# Patient Record
Sex: Male | Born: 1955
Health system: Southern US, Community
[De-identification: ages and names within clinical notes are randomized; demographics above are authoritative.]

## PROBLEM LIST (undated history)

## (undated) DIAGNOSIS — I773 Arterial fibromuscular dysplasia: Secondary | ICD-10-CM

## (undated) DIAGNOSIS — Z8679 Personal history of other diseases of the circulatory system: Secondary | ICD-10-CM

## (undated) DIAGNOSIS — I713 Abdominal aortic aneurysm, ruptured, unspecified: Secondary | ICD-10-CM

## (undated) DIAGNOSIS — Z9889 Other specified postprocedural states: Secondary | ICD-10-CM

## (undated) DIAGNOSIS — I739 Peripheral vascular disease, unspecified: Secondary | ICD-10-CM

## (undated) DIAGNOSIS — I10 Essential (primary) hypertension: Secondary | ICD-10-CM

## (undated) DIAGNOSIS — C801 Malignant (primary) neoplasm, unspecified: Secondary | ICD-10-CM

## (undated) DIAGNOSIS — E785 Hyperlipidemia, unspecified: Secondary | ICD-10-CM

## (undated) HISTORY — PX: HERNIA REPAIR: SHX51

## (undated) HISTORY — DX: Hyperlipidemia, unspecified: E78.5

## (undated) HISTORY — DX: Essential (primary) hypertension: I10

## (undated) HISTORY — PX: HIP ARTHROPLASTY: SHX981

## (undated) HISTORY — PX: KNEE SURGERY: SHX244

---

## 1999-07-18 ENCOUNTER — Other Ambulatory Visit: Admission: RE | Admit: 1999-07-18 | Discharge: 1999-07-18 | Payer: Self-pay | Admitting: Urology

## 2001-11-29 ENCOUNTER — Ambulatory Visit: Admission: RE | Admit: 2001-11-29 | Discharge: 2002-02-27 | Payer: Self-pay | Admitting: Radiation Oncology

## 2004-07-22 ENCOUNTER — Ambulatory Visit: Payer: Self-pay | Admitting: Endocrinology

## 2004-08-12 ENCOUNTER — Ambulatory Visit: Payer: Self-pay | Admitting: *Deleted

## 2004-08-21 ENCOUNTER — Ambulatory Visit: Payer: Self-pay

## 2004-09-18 ENCOUNTER — Ambulatory Visit: Payer: Self-pay | Admitting: *Deleted

## 2004-09-21 HISTORY — PX: CARDIAC SURGERY: SHX584

## 2004-09-23 ENCOUNTER — Inpatient Hospital Stay (HOSPITAL_BASED_OUTPATIENT_CLINIC_OR_DEPARTMENT_OTHER): Admission: RE | Admit: 2004-09-23 | Discharge: 2004-09-23 | Payer: Self-pay | Admitting: *Deleted

## 2004-09-23 ENCOUNTER — Ambulatory Visit: Payer: Self-pay | Admitting: *Deleted

## 2004-09-29 ENCOUNTER — Ambulatory Visit: Payer: Self-pay | Admitting: Cardiology

## 2004-10-03 ENCOUNTER — Ambulatory Visit (HOSPITAL_COMMUNITY): Admission: RE | Admit: 2004-10-03 | Discharge: 2004-10-04 | Payer: Self-pay | Admitting: Cardiology

## 2004-10-13 ENCOUNTER — Ambulatory Visit: Payer: Self-pay | Admitting: Internal Medicine

## 2004-10-31 ENCOUNTER — Ambulatory Visit: Payer: Self-pay | Admitting: Physician Assistant

## 2004-12-02 ENCOUNTER — Ambulatory Visit: Payer: Self-pay | Admitting: Cardiology

## 2005-10-28 ENCOUNTER — Ambulatory Visit: Payer: Self-pay | Admitting: Cardiology

## 2006-01-04 ENCOUNTER — Ambulatory Visit: Payer: Self-pay | Admitting: Cardiology

## 2008-03-26 ENCOUNTER — Ambulatory Visit (HOSPITAL_COMMUNITY): Admission: AD | Admit: 2008-03-26 | Discharge: 2008-03-27 | Payer: Self-pay | Admitting: Orthopedic Surgery

## 2008-09-21 HISTORY — PX: KNEE SURGERY: SHX244

## 2011-02-03 NOTE — Op Note (Signed)
Robert [name], Robert [name]          ACCOUNT NO.:  [id number]   MEDICAL RECORD NO.:  [id number]          PATIENT TYPE:  INP   LOCATION:  0002                         FACILITY:  [facility]   PHYSICIAN:  Madlyn Frankel. Charlann Boxer, M.D.  DATE OF BIRTH:  31-Jan-1956   DATE OF PROCEDURE:  03/26/2008  DATE OF DISCHARGE:                               OPERATIVE REPORT   PREOPERATIVE DIAGNOSIS:  Right knee medial compartment degenerative  changes.   POSTOPERATIVE DIAGNOSIS:  Right knee medial compartment degenerative  changes.   PROCEDURE:  Right knee partial knee replacement utilizing the Biomet  Oxford Knee System, size large femoral component,  a D tibial component  for medial right knee and a size 5 large insert to match the femoral  component.   SURGEON:  Madlyn Frankel. Charlann Boxer, M.D.   ASSISTANT:  Yetta Glassman. Mann, PA.   ANESTHESIA:  A Duramorph spinal block plus LMA.   DRAINS:  One.   TOURNIQUET TIME:  75 minutes at 250 mmHg.   COMPLICATIONS:  None.   INDICATIONS FOR PROCEDURE:  Robert [name] is a 55 year old gentleman who  was referred for evaluation of bilateral knee osteoarthritis,  predominantly worse in the medial compartment with primary complaints  medially.  Radiographically, he did have some evidence of some  patellofemoral change but based on his predominant complaints, we  reviewed his surgical options.  A 55 year old gentleman who is a large  man at 6 feet 4 inches, 290 pounds.  Given the fact he failed all  conservative measures and injections, previous surgeries, he wished to  proceed with an arthroplasty.  I discussed with him the options and  based on his primary complaints, felt that he was a candidate for  partial knee replacement.  We reviewed the pros and cons and risks and  benefits of both of these procedures comparatively, primarily focusing  on the risks of the need to revise him to a total knee replacement at  some point versus having to revised knee at some point with a  total knee  replacement.  After reviewing all these risks and benefits, he wished to  proceed with a primary partial knee replacement.  I discussed with him  the [employer], risks and benefits, and consent was obtained.   DESCRIPTION OF PROCEDURE:  Patient was brought to the operating theater.  Once adequate anesthesia, preoperative antibiotics, 2 grams of Ancef,  administered, patient was positioned supine with a thigh tourniquet  placed and positioning the Oxford leg holder on the side of the bed to  allow for 110-120 degrees of flexion to perform the procedure.  The  right leg was prescrubbed, prepped and draped in a sterile fashion.  Leg  was exsanguinated and tourniquet elevated.  I made a para-midline  incision from the proximal aspect of the patella to the tibial tubercle  region.  Sharp dissection was carried down into this finding the soft  tissues borders and free up soft tissue scaring.  A median arthrotomy  was then carried out modifying the lateral patella subluxation.  Following initial exposure, removing synovial fat pad anteriorly, I  removed the anterior portion of  the medial meniscus and placed  retractors.  I then used a 3/5 osteotome to debride osteophytes on the  medial femoral condyle from anterior to distal.   I then removed a small amount of osteophyte into the lateral notch area  near the ACL. The ACL was intact.  Patient was noted to have some grade  II-III change in the patellofemoral trochlear area.  I did debride some  osteophytes of the medial border of the patella and anterior aspect.  Following this exposure, I attended to the tibial component first, using  extramedullary guide positioned parallel to the tibial spines.  It was  pinned into position.  I used the reciprocating saw into the notch  heading toward the femoral head and then used an oscillating saw to  remove that bone fragment.  I checked the size of the bone cut and felt  that a size D tray  fit best.  No further osteophyte debridement was  necessary on the medial aspect of the tibia.  With this in place, the  size 4 feeler gauge went in easily with a 5 going in with good tension.   At this point, I went ahead and opened up the femoral canal using the  drill anterior to the medial border of the notch.  I then passed the  intramedullary rod into the canal.   At this point, with the D tray in place,, the 4 feeler gauge in place,  the guide for the posterior cutting block was placed.  It was in correct  orientation parallel to the guidewire, it was drilled into position.  Posterior cutting block was then placed and the posterior cut made with  an oscillating saw without difficulty.  At this point, I removed the  remaining meniscus.   A 0 spigot was then used and the distal femur milled.  I did a trial  reduction and felt again that a size 5 felt good in flexion but in  extension initially, I felt there had been one feeler gauge felt to be  better.  At this point, I used a 4 spigot and milled the distal femur,  removing bone fragments with the osteotome and rongeur.  I retrialed.  I  felt that in extension that the 5 feeler gauge was too tight and thus I  went to the 5 spigot and remilled the femur removing bone fragments.  At  this point, I retrialed and felt happy with the tension and the  balancing from extension to flexion.   At this point, we pinned the tibial tray in position.  I used the hook  to ensure that the tibial component did not slide too far posteriorly  and then used the reciprocating saw to drill out the trough area.  I  then used the keel device to remove to remove the bone fragments.  Trial  reduction was now carried out with a keeled tibial component, size D and  the large femur and the trial 5.  At 20 degrees of flexion there was a  millimeter or 2 of play.  Given this, I felt that the ligaments were  well balanced.  All trial components removed, I used  the drill to drill  into the sclerotic bone to allow for cement interdigitation.  We  irrigated the knee copiously, removing any bony fragments as necessary.  Cement was mixed.  I mixed two batches of cement for single cement  technique.   Once the knee had been irrigated and  dried, tibial component was  cemented in first.  Excessive cement was removed and I put the trial  femoral in place and for 2 minutes, held the knee in 45 degree flexion.  Following this, I removed the femoral component and removed excessive  cement in the posterior aspect of the knee and then cemented in the  femoral component.  Then we placed the 5 feeler gauge in place and again  held the knee in 45 degrees of flexion until the cement cured.  Once the  cement cured, I was able to remove the remaining cement.  As much as I  could possibly visualize on the medial and posterior aspect of the knee  using reflective surface of the tibial tray, I did not see any remaining  bone cement that needed to be removed.  I reirrigated out the knee and  based on the trial reduction at this point, felt that a size 5 was best.  The size 5 component was then placed into the knee with a good snap.   We reirrigated the knee at this point and placed the medium Hemovac  drain into the suprapatellar pouch.  I reapproximated the extensor  mechanism with a #1 Vicryl.  The remainder of the wound was closed with  2-0 Vicryl and running 4-0 Monocryl.  The knee was cleaned, dried and  dressed sterilely with dry Steri-Strips and a bulky sterile dressing.  He was brought to the recovery room in stable condition, tolerating the  procedure well.      Madlyn Frankel Charlann Boxer, M.D.  Electronically Signed     MDO/MEDQ  D:  03/26/2008  T:  03/26/2008  Job:  [id number]

## 2011-02-03 NOTE — H&P (Signed)
Robert [name], Robert [name]          ACCOUNT NO.:  [id number]   MEDICAL RECORD NO.:  [id number]          PATIENT TYPE:  INP   LOCATION:  NA                           FACILITY:  [facility]   PHYSICIAN:  Madlyn Frankel. Charlann Boxer, M.D.  DATE OF BIRTH:  May 18, 1956   DATE OF ADMISSION:  03/26/2008  DATE OF DISCHARGE:                              HISTORY & PHYSICAL   PROCEDURE:  A right partial knee replacement.   CHIEF COMPLAINT:  Right knee pain.   HISTORY OF PRESENT ILLNESS:  A 55 year old male with a history of right  knee pain secondary to medial compartment osteoarthritis.  It has been  refractory to all conservative treatment including oral anti-  inflammatories and cortisone injections.  He has diminished quality of  life and has been presurgically cleared for a right partial knee  replacement.   PAST MEDICAL HISTORY:  1. Osteoarthritis.  2. Hypertension.  3. Hyperlipidemia.   PAST SURGICAL HISTORY:  None.   FAMILY HISTORY:  Cancer, hypertension, diabetes, coronary artery  disease, kidney disease.   SOCIAL HISTORY:  The patient is married.  Primary caregiver will be his  wife after surgery.   DRUG ALLERGIES:  NO KNOWN DRUG ALLERGIES.   MEDICATIONS:  1. Aspirin 325 mg p.o. daily.  2. Adalat 90 mg tablet sustained release p.o. daily.  3. Crestor 20 mg one p.o. daily.  4. Benazepril/HCTZ 20/12.5 mg two p.o. daily.   REVIEW OF SYSTEMS:  See HPI.   PHYSICAL EXAMINATION:  VITAL SIGNS:  Pulse 96, respirations 18, blood  pressure 134/88.  GENERAL:  Awake, alert and oriented, well-developed, well-nourished.  NECK:  Supple.  No carotid bruits.  CHEST:  Lungs clear to auscultation bilaterally.  BREASTS:  Deferred.  HEART:  Regular rate and rhythm.  S1-S2 distinct.  ABDOMEN:  Soft, nontender, bowel sounds present.  GENITOURINARY:  Deferred.  EXTREMITIES:  Right lower extremity has medial sided tenderness of his  knee.  SKIN:  No cellulitis.  NEUROLOGIC:  Intact distal sensibilities.   LABORATORIES/EKG/CHEST X-RAY:  All pending presurgical testing.   IMPRESSION:  Right medial compartment knee osteoarthritis.   PLAN OF ACTION:  A right partial knee replacement by surgeon Dr. Lajoyce Corners on March 26, 2008 at [facility].  Risks and complications  were discussed.   POSTOPERATIVE MEDICATIONS:  The postoperative medications provided at  the time of history and physical include:  1. Lovenox.  2. Robaxin.  3. Iron.  4. Aspirin.  5. Colace.  6. MiraLax.  (Pain medicines will be prescribed at time of surgery.)     ______________________________  Yetta Glassman. [address], [place]      Madlyn Frankel. Charlann Boxer, M.D.  Electronically Signed    BLM/MEDQ  D:  03/21/2008  T:  03/21/2008  Job:  [id number]

## 2011-02-06 NOTE — H&P (Signed)
NAMEADALID, BECKMANN          ACCOUNT NO.:  [id number]   MEDICAL RECORD NO.:  [id number]          PATIENT TYPE:  OIB   LOCATION:  2854                         FACILITY:  [facility]   PHYSICIAN:  Salvadore Farber, M.D. LHCDATE OF BIRTH:  Dec 16, 1955   DATE OF ADMISSION:  10/03/2004  DATE OF DISCHARGE:                                HISTORY & PHYSICAL   CHIEF COMPLAINT:  Difficult to control hypertension, probable fibromuscular  dysplasia of the left renal artery.   HISTORY OF PRESENT ILLNESS:  Mr. Turano is a 55 year old gentleman with  left-sided hypertension as well as dyslipidemia, obesity and a family  history of premature atherosclerotic disease.  He recently underwent cardiac  catheterization by Dr. Gerri Spore for an abnormal adenosine Cardiolite.  This  ended up demonstrating normal coronary arteries.  Abdominal aortography was  performed due to his difficult to control blood pressure.  Given the  patient's size, the images were not optimal.  However, they did appear to  demonstrate fibromuscular dysplasia of the single left renal artery.  The  patient returns for digital subtraction angiography, and selective bilateral  renal angiography with an eye to percutaneous revascularization on the left.   PAST MEDICAL HISTORY:  Hypertension, dyslipidemia.   CURRENT MEDICATIONS:  1.  Aspirin 81 mg per day.  2.  Hyzar 100/25 one per day.  3.  Labetalol 400 mg twice per day.  4.  Norvasc 10 mg per day.  5.  Demadex 40 mg twice per day.  6.  K-Dur 40 mEq per day.   ALLERGIES:  No known drug allergies.   SOCIAL HISTORY:  The patient is not currently working.  He is married with  two children aged 30 and 71.  He does not smoke and drinks alcohol rarely.   FAMILY HISTORY:  Brother had myocardial infarction at age 17.  Father had  CABG in his 25s.  Mother had Alzheimer's.   REVIEW OF SYSTEMS:  Negative in detail.   PHYSICAL EXAMINATION:  GENERAL:  This is an obese, otherwise  well-appearing  man in no distress with a heart rate 83, blood pressure 126/90, oxygen  saturation of 97% on room air.  VITAL SIGNS:  Temperature 98.8.  NECK:  No jugular venous distention or thyromegaly.  LUNGS:  Clear to auscultation.  HEART:  He has a nondisplaced point of maximal cardiac impulse.  There is a  regular rate and rhythm without murmurs, rubs or gallops.  ABDOMEN:  Obese, soft, nondistended and nontender.  There is no  hepatosplenomegaly.  Bowel sounds are normal.  EXTREMITIES:  Warm without cyanosis, clubbing or edema, or ulceration.   Carotid pulses 2+ bilaterally without bruit.  Posterior tibial artery and  dorsalis pedis pulses 2+ bilaterally.   LABORATORY DATA:  September 29, 2004:  Hematocrit 47, platelet count 198,000,  white blood cell count 10.0, INR 0.9, sodium 141, potassium 3.2  (subsequently replaced).  Creatinine 1.3, glucose 89.   IMPRESSION/RECOMMENDATIONS:  Patient with hypertension which is not  controlled despite substantial doses of 5 medications.  Prior nonselective  angiography suggested left renal artery fibromuscular dysplasia.  He will be  admitted  for angiography with an eye to percutaneous revascularization.      WED/MEDQ  D:  10/03/2004  T:  10/03/2004  Job:  [id number]   cc:   Carole Binning, M.D. [facility]   Sean A. Everardo All, M.D. [facility]

## 2011-02-06 NOTE — Discharge Summary (Signed)
Robert [name], Robert [name]          ACCOUNT NO.:  [id number]   MEDICAL RECORD NO.:  [id number]          PATIENT TYPE:  OIB   LOCATION:  5739                         FACILITY:  [facility]   PHYSICIAN:  Salvadore Farber, M.D. LHCDATE OF BIRTH:  1956/03/14   DATE OF ADMISSION:  10/03/2004  DATE OF DISCHARGE:  10/04/2004                           DISCHARGE SUMMARY - REFERRING   PROCEDURE:  Bilateral renal angiogram/bilateral balloon angioplasty October 03, 2004.   REASON FOR ADMISSION:  [name] is a 55 year old male, status post  recent cardiac catheterization revealing moderate non-obstructive coronary  artery disease with normal left ventricular function and probable  fibromuscular dysplasia of the left renal artery, who was thus referred to  Dr. Randa Evens for possible left renal artery angioplasty.   LABORATORY DATA:  Sodium 140, potassium 3.5, glucose 79, BUN 10, creatinine  1.2 at discharge.  CBC normal at discharge.   HOSPITAL COURSE:  The patient presented for elective renal angiography which  was performed by Dr. Randa Evens on January 13 (see report for full  details), revealing bilateral renal fibromuscular dysplasia.  The patient  was successfully treated with bilateral balloon angioplasty with no noted  complications.   The patient was kept overnight.  The patient was discharged the following  morning in hemodynamically stable condition.   The patient will not need to be treated with Plavix.   Antihypertensive medications were adjusted:  Norvasc was discontinued and  labetalol was decreased to half the previous dose.  The patient was,  however, started on high-dose lovastatin.   At discharge, we discussed whether to resume Demadex.  This had been held  prior to admission.  We will defer to Dr. Randa Evens regarding  resumption of previous high-dose Demadex regimen.   DISCHARGE MEDICATIONS:  1.  Lovastatin 80 mg at bedtime.  2.  Enteric-coated aspirin  325 mg daily.  3.  Labetalol 200 mg b.i.d.  4.  Hyzaar 100/25 mg daily.   DISCHARGE INSTRUCTIONS:  1.  Discontinue Norvasc.  2.  Hold Demadex until further orders.  3.  No heavy lifting/driving for two days; maintain a low fat/low      cholesterol diet; call the office if there is any swelling/bleeding in      the groin.   FOLLOWUP:  The patient will follow up with Dr. Randa Evens in two weeks,  arrangements to be made through our office.   DISCHARGE DIAGNOSES:  1.  Renal vascular disease, bilateral renal fibromuscular dysplasia, status      post bilateral balloon angioplasty October 03, 2004.  2.  Moderate nonobstructive coronary artery disease.      1.  Cardiac catheterization September 23, 2004.      2.  Normal left ventricular function.  3.  Hypertension requiring multi-modality therapy.  4.  Dyslipidemia.      GS/MEDQ  D:  10/04/2004  T:  10/04/2004  Job:  [id number]

## 2011-02-06 NOTE — Cardiovascular Report (Signed)
NAMEMarland Kitchen  [name], [name]          ACCOUNT NO.:  [id number]   MEDICAL RECORD NO.:  [id number]          PATIENT TYPE:  OIB   LOCATION:  6501                         FACILITY:  [facility]   PHYSICIAN:  Robert [name], M.D. LHCDATE OF BIRTH:  [birth year]   DATE OF PROCEDURE:  09/23/2004  DATE OF DISCHARGE:                              CARDIAC CATHETERIZATION   PROCEDURE PERFORMED:  Left heart catheterization with coronary angiography,  left ventriculography, and abdominal aortography.   INDICATIONS FOR PROCEDURE:  Robert [name] is a 55 year old male with  cardiovascular risk factors of hypertension, hyperlipidemia, and family  history of premature coronary artery disease.  He has significant  hypertension requiring 3 different anti-hypertensive medications plus a  diuretic.  He was seen for routine physical examination early in November  and had an abnormal EKG at that time suggesting possible anterolateral  ischemia.  We, therefore, proceeded with an adenosine Myoview scan.  This  showed suggestion of possible mild anterior ischemia.  The patient was,  therefore, referred for cardiac catheterization.  Because of significant  hypertension, we also planned on doing abdominal aortography to rule out  renal artery stenosis.   PROCEDURE NOTE:  A 4-French sheath was placed in the right femoral artery.  Coronary angiography was performed with a standard Judkins 4-French  catheters.  Left ventriculography and abdominal aortography were performed  with an angled pigtail catheter.  Contrast was Omnipaque.  There were no  complications.   RESULTS:  1.  Hemodynamics.  Left ventricular pressure 102/3, aortic pressure 110/72.      There is no aortic valve gradient.  2.  Left ventriculogram.  Wall motion with normal ejection fraction      estimated at 60%.  There is no mitral regurgitation.  3.  Abdominal aortogram reveals severe tortuosity of the abdominal aorta.      The right renal artery is  normal.  However, the left renal artery has      the appearance of probable fibromuscular dysplasia.  The iliac arteries      are moderately tortuous.  4.  Coronary arteriography.      1.  The left main is normal.      2.  The left anterior descending artery has minor luminal irregularities          in the mid vessel.  It is, otherwise, normal giving rise to a large          first diagonal branch.      3.  The left circumflex gives rise to a normal-sized bifurcating first          obtuse marginal and small second obtuse marginal branch.  The left          circumflex is normal.      4.  The right coronary artery is a dominant vessel.  In the ostium of          the right coronary artery, there is a tubular 30-50% stenosis.  The          right coronary artery is, otherwise, normal giving rise to a normal  posterior descending artery, a small first posterolateral branch,          and normal sized second posterolateral branch.   IMPRESSION:  1.  Normal left ventricular systolic function.  2.  Mild to moderate but nonobstructive disease involving the right coronary      artery.  3.  Probable fibromuscular dysplasia of the left renal artery.   PLAN:  The patient will be treated medically for his coronary issues.  We  will ask Dr. Samule [name] to review the abdominal aortogram to see whether the  patient may be a candidate for angioplasty of the left renal artery.      Robert [name]   MWP/MEDQ  D:  09/23/2004  T:  09/23/2004  Job:  [id number]   cc:   Robert [name], M.D. [facility]   Cardiac Cath Lab

## 2011-02-06 NOTE — Op Note (Signed)
[name], [name]          ACCOUNT NO.:  [id number]   MEDICAL RECORD NO.:  [id number]          PATIENT TYPE:  OIB   LOCATION:  2854                         FACILITY:  [facility]   PHYSICIAN:  Salvadore Farber, M.D. LHCDATE OF BIRTH:  December 25, 1955   DATE OF PROCEDURE:  10/03/2004  DATE OF DISCHARGE:                                 OPERATIVE REPORT   PROCEDURE:  Abdominal aortography, selective bilateral renal angiography,  balloon angioplasty of both renal arteries, Angio-Seal closure of the right  common femoral arteriotomy site.   INDICATIONS:  [name] is a 55 year old gentleman with hypertension  which has been difficult to control despite substantial doses of five  antihypertensive medications. He recently underwent cardiac catheterization  which demonstrated no significant coronary disease. Abdominal aortography  was performed to rule out renal artery stenosis. This demonstrated  questionable fibromuscular dysplasia of the left renal artery. He therefore  returns today for angiography using digital subtraction and possible balloon  angioplasty.   PROCEDURE TECHNIQUE:  Informed consent was obtained. Under 1% lidocaine  local anesthesia, a 6-French sheath was placed in the right common femoral  artery using the modified Seldinger technique. A pigtail catheter was  advanced at the suprarenal abdominal aorta. Abdominal aortography was  performed by power injection. This confirmed definite fibromuscular  dysplasia on the left and raised question of possible fibromuscular  dysplasia on the right.   Anticoagulation was initiated with 7000 units of heparin. Additional heparin  was given to achieve and maintain an ACT of greater than 250 seconds. A 6-  [place] LIMA guide was advanced over a wire used to first selectively engage  the right renal artery. Angiography demonstrated questionable fibromuscular  dysplasia on the right. To further assess, I decided to do pressure  measurement. I therefore advanced a stabilizer wire across the lesion and  over this, advanced a 4-French end-hole catheter. This was simultaneously  compared with aortic pressure via the guide. This demonstrated a 14 mm  translesional gradient. Pullback was then performed demonstrating that the  gradient gradually decreased over the entirety of the proximal and  midportion of the renal artery. I therefore a proceeded to balloon  angioplasty of this right renal artery. I began with a 6 x 20 mm Viatrac for  4 sequential inflations at 6 atmospheres distally and 8 atmospheres  proximally. I then remeasured the pressure gradient using end-hole catheter  and found to be at least 10 mmHg residual. I therefore went in with a 7 x 15  mm Viatrac inflated to 8 atmospheres distally and 10 atmospheres throughout  the remainder of the renal artery. This was over a total of eight  inflations. I then  rechecked the pressure gradient and found to be 2 mmHg.  Angiogram confirmed no perforation and no dissection.   Attention was then turned to the left renal artery. Given the extreme  tortuosity in the abdominal aorta, the LIMA guide would not engage. I used  therefore, a 6-French Amplatz left three guide. With this I was able to  selectively engage the left renal artery. A stabilizer wire was advanced  across the lesion. Due  to the length of the guide required, I could not  check a pressure prior to intervening. However, it was clear that the  fibromuscular dysplasia was severe and lengthy. I treated the segment of  fibromuscular dysplasia using a 7 x 15 mm Viatrac at 6 atmospheres distally,  and then 10 atmospheres for a total of seven inflations over the segment. I  then advanced a 5-French JR-4 diagnostic catheter which tip I had  straightened by hand over the wire into the renal artery. I performed a  pressure pullback demonstrating a less than 5 mm gradient across the lesion.     The arteriotomy  was then closed using 6-French Angio-Seal device. Complete  hemostasis was obtained. The patient tolerated procedure well and was  transferred to the holding room in stable condition.   COMPLICATIONS:  None.   FINDINGS:  1.  Very tortuous abdominal aorta with possible early aneurysm formation of      both the infrarenal abdominal aorta and both common iliac arteries.  2.  Single renal arteries bilaterally, both with fibromuscular dysplasia.      These were treated with balloon angioplasty with excellent result as      assessed by pressure gradient.   IMPRESSION/RECOMMENDATIONS:  Will admit the patient to observation. Will be  treated with aspirin. Will hold his Norvasc and decrease his labetalol by  half overnight. Will reassess blood pressure, thereafter.      WED/MEDQ  D:  10/03/2004  T:  10/03/2004  Job:  [id number]   cc:   Gregary Signs A. Everardo All, M.D. [facility]   Carole Binning, M.D. [facility]

## 2011-06-18 LAB — BASIC METABOLIC PANEL
BUN: 11
BUN: 8
CO2: 30
Calcium: 9
Chloride: 102
Chloride: 103
Creatinine, Ser: 1.14
GFR calc Af Amer: >60
GFR calc non Af Amer: >60
Glucose, Bld: 89
Potassium: 3.9
Sodium: 140

## 2011-06-18 LAB — CBC
HCT: 42.9
Hemoglobin: 14.7
MCHC: 34.7
MCV: 91.6
MCV: 92.8
Platelets: 181
RDW: 12.9
RDW: 13

## 2011-06-18 LAB — DIFFERENTIAL
Basophils Absolute: 0
Basophils Relative: 0
Eosinophils Absolute: 0.2
Monocytes Relative: 5
Neutro Abs: 5.5
Neutrophils Relative %: 60

## 2011-06-18 LAB — URINALYSIS, ROUTINE W REFLEX MICROSCOPIC
Glucose, UA: NEGATIVE
Hgb urine dipstick: NEGATIVE
Ketones, ur: NEGATIVE
Protein, ur: NEGATIVE
Urobilinogen, UA: 1

## 2011-06-18 LAB — PROTIME-INR
INR: 1
Prothrombin Time: 13.3

## 2011-06-18 LAB — APTT: aPTT: 30

## 2011-06-18 LAB — TYPE AND SCREEN: Antibody Screen: NEGATIVE

## 2012-02-01 ENCOUNTER — Encounter (INDEPENDENT_AMBULATORY_CARE_PROVIDER_SITE_OTHER): Payer: Self-pay | Admitting: Surgery

## 2012-02-18 ENCOUNTER — Encounter (INDEPENDENT_AMBULATORY_CARE_PROVIDER_SITE_OTHER): Payer: Self-pay | Admitting: General Surgery

## 2012-02-18 ENCOUNTER — Ambulatory Visit (INDEPENDENT_AMBULATORY_CARE_PROVIDER_SITE_OTHER): Payer: BC Managed Care – PPO | Admitting: Surgery

## 2012-02-18 ENCOUNTER — Encounter (INDEPENDENT_AMBULATORY_CARE_PROVIDER_SITE_OTHER): Payer: Self-pay | Admitting: Surgery

## 2012-02-18 VITALS — BP 158/96 | HR 68 | Temp 97.7°F | Resp 18 | Ht 76.0 in | Wt 319.0 lb

## 2012-02-18 DIAGNOSIS — K429 Umbilical hernia without obstruction or gangrene: Secondary | ICD-10-CM | POA: Insufficient documentation

## 2012-02-18 NOTE — Progress Notes (Signed)
Patient ID: Robert [name], male   DOB: 1956-09-18, 56 y.o.   MRN: [id number]  No chief complaint on file.   HPI Robert [name] is a 56 y.o. male.  Referred by Dr. Knox Royalty for evaluation of umbilical hernia HPI This is a 56 year old male who presents with a history of a slowly enlarging umbilical hernia. This has been present for several years. It occasionally becomes painful and uncomfortable. However when he is supine he is able to partially reduce this hernia. He denies any obstructive symptoms. No nausea or vomiting. He was recently evaluated by Dr. Rennis Golden with [facility]. Cardiac clearance is pending after a recent stress test. Past Medical History  Diagnosis Date  . Hyperlipidemia   . Hypertension     Past Surgical History  Procedure Date  . Cardiac surgery 2006    stent placement  . Knee surgery 2009/2010    right  . Knee surgery 2010    right knee partial replacement    Family History  Problem Relation Age of Onset  . Heart disease Father   . Cancer Paternal Uncle     prostate  . Cancer Paternal Uncle     prostate    Social History History  Substance Use Topics  . Smoking status: Never Smoker   . Smokeless tobacco: Not on file  . Alcohol Use: Yes    No Known Allergies  Current Outpatient Prescriptions  Medication Sig Dispense Refill  . benazepril-hydrochlorthiazide (LOTENSIN HCT) 20-12.5 MG per tablet Take 1 tablet by mouth daily. Takes 2 pills at once      . metoprolol succinate (TOPROL-XL) 25 MG 24 hr tablet Take 25 mg by mouth daily.      [name] Kitchen NIFEdipine (ADALAT CC) 90 MG 24 hr tablet Take 90 mg by mouth daily.      . rosuvastatin (CRESTOR) 20 MG tablet Take 20 mg by mouth daily.      [name] Kitchen aspirin 81 MG tablet Take 81 mg by mouth daily.        Review of Systems Review of Systems  Constitutional: Negative for fever, chills and unexpected weight change.  HENT: Negative for hearing loss, congestion, sore throat, trouble  swallowing and voice change.   Eyes: Negative for visual disturbance.  Respiratory: Negative for cough and wheezing.   Cardiovascular: Negative for chest pain, palpitations and leg swelling.  Gastrointestinal: Negative for nausea, vomiting, abdominal pain, diarrhea, constipation, blood in stool, abdominal distention, anal bleeding and rectal pain.  Genitourinary: Negative for hematuria and difficulty urinating.  Musculoskeletal: Negative for arthralgias.  Skin: Negative for rash and wound.  Neurological: Negative for seizures, syncope, weakness and headaches.  Hematological: Negative for adenopathy. Does not bruise/bleed easily.  Psychiatric/Behavioral: Negative for confusion.    Blood pressure 158/96, pulse 68, temperature 97.7 F (36.5 C), temperature source Temporal, resp. rate 18, height 6\' 4"  (1.93 m), weight 319 lb (144.697 kg).  Physical Exam Physical Exam WDWN in NAD HEENT:  EOMI, sclera anicteric Neck:  No masses, no thyromegaly Lungs:  CTA bilaterally; normal respiratory effort CV:  Regular rate and rhythm; no murmurs Abd:  +bowel sounds, soft, large protruding bulge at right upper side of umbilicus; partially reducible - defect about 2.5 cm across Ext:  Well-perfused; no edema Skin:  Warm, dry; no sign of jaundice  Data Reviewed none  Assessment    Umbilical hernia - partially reducible    Plan    Umbilical hernia repair with mesh.  The surgical procedure has  been discussed with the patient.  Potential risks, benefits, alternative treatments, and expected outcomes have been explained.  All of the patient's questions at this time have been answered.  The likelihood of reaching the patient's treatment goal is good.  The patient understand the proposed surgical procedure and wishes to proceed.  First, we will await cardiac clearance from Dr. Rennis Golden prior to scheduling the surgery.       [name] K. 02/18/2012, 12:22 PM

## 2012-02-25 ENCOUNTER — Encounter (INDEPENDENT_AMBULATORY_CARE_PROVIDER_SITE_OTHER): Payer: Self-pay

## 2012-05-13 ENCOUNTER — Other Ambulatory Visit: Payer: Self-pay | Admitting: Internal Medicine

## 2012-05-18 ENCOUNTER — Encounter (HOSPITAL_COMMUNITY): Payer: Self-pay | Admitting: Pharmacy Technician

## 2012-05-18 MED ORDER — DEXTROSE 5 % IV SOLN
3.0000 g | INTRAVENOUS | Status: DC
  Filled 2012-05-18: qty 3000

## 2012-05-19 ENCOUNTER — Ambulatory Visit (HOSPITAL_COMMUNITY)
Admission: RE | Admit: 2012-05-19 | Discharge: 2012-05-19 | Disposition: A | Discharge status: Home / Self Care | Payer: BC Managed Care – PPO | Source: Ambulatory Visit | Attending: Internal Medicine | Admitting: Internal Medicine

## 2012-05-19 ENCOUNTER — Encounter (HOSPITAL_COMMUNITY)
Admission: RE | Disposition: A | Discharge status: Home / Self Care | Payer: Self-pay | Source: Ambulatory Visit | Attending: Internal Medicine

## 2012-05-19 DIAGNOSIS — I2510 Atherosclerotic heart disease of native coronary artery without angina pectoris: Secondary | ICD-10-CM | POA: Insufficient documentation

## 2012-05-19 DIAGNOSIS — R0989 Other specified symptoms and signs involving the circulatory and respiratory systems: Secondary | ICD-10-CM | POA: Insufficient documentation

## 2012-05-19 DIAGNOSIS — R0609 Other forms of dyspnea: Secondary | ICD-10-CM | POA: Insufficient documentation

## 2012-05-19 HISTORY — PX: LEFT HEART CATHETERIZATION WITH CORONARY ANGIOGRAM: SHX5451

## 2012-05-19 LAB — BASIC METABOLIC PANEL
BUN: 14 mg/dL (ref 6–23)
Calcium: 9.5 mg/dL (ref 8.4–10.5)
Creatinine, Ser: 1.09 mg/dL (ref 0.50–1.35)
GFR calc Af Amer: 87 mL/min — ABNORMAL LOW (ref >90)

## 2012-05-19 LAB — CBC
MCHC: 34.9 g/dL (ref 30.0–36.0)
MCV: 89.3 fL (ref 78.0–100.0)
Platelets: 186 10*3/uL (ref 150–400)
RDW: 12.9 % (ref 11.5–15.5)
WBC: 9.5 10*3/uL (ref 4.0–10.5)

## 2012-05-19 LAB — PROTIME-INR: INR: 1.04 (ref 0.00–1.49)

## 2012-05-19 SURGERY — LEFT HEART CATHETERIZATION WITH CORONARY ANGIOGRAM
Anesthesia: LOCAL

## 2012-05-19 MED ORDER — SODIUM CHLORIDE 0.9 % IV SOLN
INTRAVENOUS | Status: DC
  Administered 2012-05-19: 11:00:00 via INTRAVENOUS

## 2012-05-19 MED ORDER — DIAZEPAM 5 MG PO TABS
5.0000 mg | ORAL_TABLET | ORAL | Status: AC
  Administered 2012-05-19: 5 mg via ORAL
  Filled 2012-05-19: qty 1

## 2012-05-19 MED ORDER — SODIUM CHLORIDE 0.9 % IJ SOLN: 3.0000 mL | INTRAMUSCULAR | Status: DC | PRN

## 2012-05-19 MED ORDER — ACETAMINOPHEN 325 MG PO TABS: 650.0000 mg | ORAL_TABLET | ORAL | Status: DC | PRN

## 2012-05-19 MED ORDER — ONDANSETRON HCL 4 MG/2ML IJ SOLN: 4.0000 mg | Freq: Four times a day (QID) | INTRAMUSCULAR | Status: DC | PRN

## 2012-05-19 MED ORDER — SODIUM CHLORIDE 0.9 % IV SOLN: 1.0000 mL/kg/h | INTRAVENOUS | Status: DC

## 2012-05-19 MED ORDER — HEPARIN (PORCINE) IN NACL 2-0.9 UNIT/ML-% IJ SOLN
INTRAMUSCULAR | Status: AC
  Filled 2012-05-19: qty 2000

## 2012-05-19 MED ORDER — HEPARIN SODIUM (PORCINE) 1000 UNIT/ML IJ SOLN
INTRAMUSCULAR | Status: AC
  Filled 2012-05-19: qty 1

## 2012-05-19 MED ORDER — MIDAZOLAM HCL 2 MG/2ML IJ SOLN
INTRAMUSCULAR | Status: AC
  Filled 2012-05-19: qty 2

## 2012-05-19 MED ORDER — VERAPAMIL HCL 2.5 MG/ML IV SOLN
INTRAVENOUS | Status: AC
  Filled 2012-05-19: qty 2

## 2012-05-19 MED ORDER — ASPIRIN 81 MG PO CHEW: 324.0000 mg | CHEWABLE_TABLET | ORAL | Status: DC

## 2012-05-19 MED ORDER — FENTANYL CITRATE 0.05 MG/ML IJ SOLN
INTRAMUSCULAR | Status: AC
  Filled 2012-05-19: qty 2

## 2012-05-19 MED ORDER — LIDOCAINE HCL (PF) 1 % IJ SOLN
INTRAMUSCULAR | Status: AC
  Filled 2012-05-19: qty 30

## 2012-05-19 MED ORDER — NITROGLYCERIN 0.2 MG/ML ON CALL CATH LAB
INTRAVENOUS | Status: AC
  Filled 2012-05-19: qty 1

## 2012-05-19 NOTE — CV Procedure (Signed)
[facility]     CARDIAC CATHETERIZATION REPORT  Robert [name]   [id number] October 04, 1955  Performing Cardiologist: Chrystie Nose Primary Physician: Paulino Rily, MD Primary Cardiologist:  Dr. Rennis Golden  Procedures Performed:  Left Heart Catheterization via 5 Fr right radial artery access  Native Coronary Angiography  Indication(s): abnormal nuclear stress test, known CAD, dyspnea, pre-op  History: 56 y.o. male   Consent: The procedure with Risks/Benefits/Alternatives and Indications was reviewed with the patient (and family).  All questions were answered.    Risks / Complications include, but not limited to: Death, MI, CVA/TIA, VF/VT (with defibrillation), Bradycardia (need for temporary pacer placement), contrast induced nephropathy, bleeding / bruising / hematoma / pseudoaneurysm, vascular or coronary injury (with possible emergent CT or Vascular Surgery), adverse medication reactions, infection.    The patient and family voice understanding and agree to proceed.    Risks of procedure as well as the alternatives and risks of each were explained to the (patient/caregiver).  Consent for procedure obtained. Consent for signed by MD and patient with RN witness -- placed on chart.  Procedure: The patient was brought to the 2nd Floor [facility] in the fasting state and prepped and draped in the usual sterile fashion for (Right radial) access. A modified Allen's test with plethysmography was performed on the right wrist demonstrating adequate Ulnar Artery collateral flow).    Sterile technique was used including antiseptics, cap, gloves, gown, hand hygiene, mask and sheet.  Skin prep: Chlorhexidine;  Time Out: Verified patient identification, verified procedure, site/side was marked, verified correct patient position, special equipment/implants available, medications/allergies/relevent history reviewed, required imaging and  test results available.  Performed  The right wrist was anesthetized with 1% subcutaneous Lidocaine.  The right radial artery was accessed using the Seldinger Technique with placement of a 5 Fr Glide Sheath. The sheath was aspirated and flushed.  Then a total of 10 ml of standard Radial Artery Cocktail (see medications) was infused.  A 5 Fr TIG 4.0 Catheter was advanced of over a Safety J wire into the ascending Aorta.  The catheter was used to engage the left and right coronary arterys  Multiple cineangiographic views of the left and right coronary artery system(s) were performed. This catheter was then  advanced across the Aortic Valve.  LV hemodynamics were measured and the catheter was pulled back across the Aortic Valve for measurement of "pull-back" gradient.  The catheter and the wire was removed completely out of the body.  The sheath was removed in the Cath Lab with a TR band placed at 12 ml Air at 1255 (time).  Reverse Allen's test revealed non-occlusive hemostasis.  The patient was transported to the cath lab holding area in stable condition.   The patient  was stable before, during and following the procedure.   Patient did tolerate procedure well. There were not complications.  EBL: <5 cc  Medications:  Premedication: none  Sedation:  2 mg IV Versed, 50 IV mcg Fentanyl  Contrast:  40 Omnipaque  5000 IU Heparin  10 cc radial cocktail  Hemodynamics:  Central Aortic Pressure / Mean Aortic Pressure: 108/61  LV Pressure / LV End diastolic Pressure:  7  Coronary Angiographic Data:  Left Main:  Large caliber vessel, angiographically normal appearance  Left Anterior Descending (LAD):  Large caliber vessel extending to the apex, significant toruosity, with mild luminal irregularities  Circumflex (LCx):  Large caliber vessel with tortuosity and no significant stenoses.  1st  obtuse marginal:  Angiographically normal  2nd obtuse marginal:  Small vessel, no significant  obstruction  Right Coronary Artery: Large, dominant vessel with no significant angiographic stenosis. The ostium which was previously reported to have mild to moderate disease, appears to be of normal caliber and not stenotic.  posterior descending artery: no significant obstruction  posterior lateral branch:  No significant obstruction  Impression: 1.  No significant obstructive CAD. 2.  Large coronary arteries with right dominant circulation. 3.  False positive stress test.  Plan: 1.  Medical therapy and risk factor modification. 2.  Low risk for elective hernia repair.  The case and results was discussed with the patient (and family). The case and results was not discussed with the patient's PCP. The case and results was discussed with the patient's Cardiologist.  Time Spend Directly with Patient:  45 minutes  Chrystie Nose, MD, [facility] Attending Cardiologist [facility]  Adante Courington C 05/19/2012, 12:57 PM

## 2012-05-19 NOTE — H&P (Signed)
     [facility]          INTERVAL PROCEDURE H&P   History and Physical Interval Note:  05/19/2012 12:06 PM  Robert [name] has presented today for their planned procedure. The various methods of treatment have been discussed with the patient and family. After consideration of risks, benefits and other options for treatment, the patient has consented to the procedure.  The patients' outpatient history has been reviewed, patient examined, and no change in status from most recent office note within the past 30 days. I have reviewed the patients' chart and labs and will proceed as planned. Questions were answered to the patient's satisfaction.   Robert Nose, MD, [facility] Attending Cardiologist [facility]  Lovina Zuver C 05/19/2012, 12:06 PM

## 2012-05-19 NOTE — Progress Notes (Signed)
UP AND WALKED AND TOL WELL; NO COMPLAINTS

## 2012-06-20 ENCOUNTER — Telehealth (INDEPENDENT_AMBULATORY_CARE_PROVIDER_SITE_OTHER): Payer: Self-pay | Admitting: General Surgery

## 2012-06-20 NOTE — Telephone Encounter (Signed)
Called pt back and spoke to him about the recover time from surgery. Pt stated that he will look over his schedule and see when he do the surgery and will call back and talk to the scheduler

## 2012-08-02 ENCOUNTER — Ambulatory Visit (INDEPENDENT_AMBULATORY_CARE_PROVIDER_SITE_OTHER): Payer: BC Managed Care – PPO | Admitting: Surgery

## 2012-08-02 ENCOUNTER — Encounter (INDEPENDENT_AMBULATORY_CARE_PROVIDER_SITE_OTHER): Payer: Self-pay | Admitting: Surgery

## 2012-08-02 VITALS — BP 142/68 | HR 72 | Temp 97.5°F | Resp 20 | Ht 76.0 in | Wt 323.2 lb

## 2012-08-02 DIAGNOSIS — K429 Umbilical hernia without obstruction or gangrene: Secondary | ICD-10-CM

## 2012-08-02 NOTE — Progress Notes (Signed)
Patient ID: Robert [name], male   DOB: 01/21/1956, 56 y.o.   MRN: [id number]  Chief Complaint  Patient presents with  . Re-evaluation    abd hernia    HPI Robert [name] is a 56 y.o. male.  Referred by Dr. Enrico Jones for enlarging umbilical hernia HPI HPI  Robert [name] is a 56 y.o. male. Referred by Dr. Enrico Jones for evaluation of umbilical hernia  HPI  This is a 56-year-old male who presents with a history of a slowly enlarging umbilical hernia. This has been present for several years. It occasionally becomes painful and uncomfortable. However when he is supine he is able to partially reduce this hernia. He denies any obstructive symptoms. No nausea or vomiting. He was recently evaluated by Dr. Hilty with [facility]. Cardiac clearance is pending after recent cardiac cath, which was normal.  Past Medical History  Diagnosis Date  . Hyperlipidemia   . Hypertension     Past Surgical History  Procedure Date  . Cardiac surgery 2006    stent placement  . Knee surgery 2009/2010    right  . Knee surgery 2010    right knee partial replacement    Family History  Problem Relation Age of Onset  . Heart disease Father   . Cancer Paternal Uncle     prostate  . Cancer Paternal Uncle     prostate    Social History History  Substance Use Topics  . Smoking status: Never Smoker   . Smokeless tobacco: Not on file  . Alcohol Use: Yes    No Known Allergies  Current Outpatient Prescriptions  Medication Sig Dispense Refill  . aspirin EC 325 MG tablet Take 325 mg by mouth daily.      . benazepril-hydrochlorthiazide (LOTENSIN HCT) 20-12.5 MG per tablet Take 2 tablets by mouth daily. Takes 2 pills at once      . NIFEdipine (ADALAT CC) 90 MG 24 hr tablet Take 90 mg by mouth daily.      . rosuvastatin (CRESTOR) 20 MG tablet Take 20 mg by mouth daily.        Review of Systems Review of Systems  Constitutional: Negative for fever, chills and  unexpected weight change.  HENT: Negative for hearing loss, congestion, sore throat, trouble swallowing and voice change.   Eyes: Negative for visual disturbance.  Respiratory: Negative for cough and wheezing.   Cardiovascular: Negative for chest pain, palpitations and leg swelling.  Gastrointestinal: Negative for nausea, vomiting, abdominal pain, diarrhea, constipation, blood in stool, abdominal distention, anal bleeding and rectal pain.  Genitourinary: Negative for hematuria and difficulty urinating.  Musculoskeletal: Negative for arthralgias.  Skin: Negative for rash and wound.  Neurological: Negative for seizures, syncope, weakness and headaches.  Hematological: Negative for adenopathy. Does not bruise/bleed easily.  Psychiatric/Behavioral: Negative for confusion.    Blood pressure 142/68, pulse 72, temperature 97.5 F (36.4 C), temperature source Temporal, resp. rate 20, height 6' 4" (1.93 m), weight 323 lb 3.2 oz (146.603 kg).  Physical Exam Physical Exam WDWN in NAD HEENT:  EOMI, sclera anicteric Neck:  No masses, no thyromegaly Lungs:  CTA bilaterally; normal respiratory effort CV:  Regular rate and rhythm; no murmurs Abd:  +bowel sounds, soft, non-tender, large protruding umbilicus - hernia sac 6 cm across, defect about 2.5 cm across Ext:  Well-perfused; no edema Skin:  Warm, dry; no sign of jaundice  Data Reviewed none  Assessment    Umbilical hernia - enlarging, reducible      Plan    Umbilical hernia repair with mesh.  The surgical procedure has been discussed with the patient.  Potential risks, benefits, alternative treatments, and expected outcomes have been explained.  All of the patient's questions at this time have been answered.  The likelihood of reaching the patient's treatment goal is good.  The patient understand the proposed surgical procedure and wishes to proceed.        Zaylah Blecha K. 08/02/2012, 3:09 PM    

## 2012-08-26 NOTE — Progress Notes (Signed)
To come in for bmet-had cardiac stress test then cath 8/13-clean-old stent from 06-given low clearence for hernia surgery from dr hilty- Pt denies sleep apnea

## 2012-08-29 ENCOUNTER — Encounter (HOSPITAL_BASED_OUTPATIENT_CLINIC_OR_DEPARTMENT_OTHER)
Admission: RE | Admit: 2012-08-29 | Discharge: 2012-08-29 | Disposition: A | Discharge status: Home / Self Care | Payer: BC Managed Care – PPO | Source: Ambulatory Visit | Attending: Surgery | Admitting: Surgery

## 2012-08-29 LAB — BASIC METABOLIC PANEL
BUN: 12 mg/dL (ref 6–23)
CO2: 25 mEq/L (ref 19–32)
Calcium: 9.4 mg/dL (ref 8.4–10.5)
Chloride: 103 mEq/L (ref 96–112)
Creatinine, Ser: 1.06 mg/dL (ref 0.50–1.35)
GFR calc Af Amer: 89 mL/min — ABNORMAL LOW (ref >90)
GFR calc non Af Amer: 77 mL/min — ABNORMAL LOW (ref >90)
Glucose, Bld: 112 mg/dL — ABNORMAL HIGH (ref 70–99)
Potassium: 3.8 mEq/L (ref 3.5–5.1)
Sodium: 138 mEq/L (ref 135–145)

## 2012-08-31 ENCOUNTER — Encounter (HOSPITAL_BASED_OUTPATIENT_CLINIC_OR_DEPARTMENT_OTHER): Payer: Self-pay | Admitting: *Deleted

## 2012-08-31 ENCOUNTER — Encounter (HOSPITAL_BASED_OUTPATIENT_CLINIC_OR_DEPARTMENT_OTHER)
Admission: RE | Disposition: A | Discharge status: Home / Self Care | Payer: Self-pay | Source: Ambulatory Visit | Attending: Surgery

## 2012-08-31 ENCOUNTER — Ambulatory Visit (HOSPITAL_BASED_OUTPATIENT_CLINIC_OR_DEPARTMENT_OTHER): Payer: BC Managed Care – PPO | Admitting: *Deleted

## 2012-08-31 ENCOUNTER — Ambulatory Visit (HOSPITAL_BASED_OUTPATIENT_CLINIC_OR_DEPARTMENT_OTHER)
Admission: RE | Admit: 2012-08-31 | Discharge: 2012-08-31 | Disposition: A | Discharge status: Home / Self Care | Payer: BC Managed Care – PPO | Source: Ambulatory Visit | Attending: Surgery | Admitting: Surgery

## 2012-08-31 DIAGNOSIS — E785 Hyperlipidemia, unspecified: Secondary | ICD-10-CM | POA: Insufficient documentation

## 2012-08-31 DIAGNOSIS — Z8042 Family history of malignant neoplasm of prostate: Secondary | ICD-10-CM | POA: Insufficient documentation

## 2012-08-31 DIAGNOSIS — K429 Umbilical hernia without obstruction or gangrene: Secondary | ICD-10-CM

## 2012-08-31 DIAGNOSIS — Z6837 Body mass index (BMI) 37.0-37.9, adult: Secondary | ICD-10-CM | POA: Insufficient documentation

## 2012-08-31 DIAGNOSIS — Z8249 Family history of ischemic heart disease and other diseases of the circulatory system: Secondary | ICD-10-CM | POA: Insufficient documentation

## 2012-08-31 DIAGNOSIS — I10 Essential (primary) hypertension: Secondary | ICD-10-CM | POA: Insufficient documentation

## 2012-08-31 DIAGNOSIS — Z01812 Encounter for preprocedural laboratory examination: Secondary | ICD-10-CM | POA: Insufficient documentation

## 2012-08-31 HISTORY — PX: INSERTION OF MESH: SHX5868

## 2012-08-31 HISTORY — PX: UMBILICAL HERNIA REPAIR: SHX196

## 2012-08-31 SURGERY — REPAIR, HERNIA, UMBILICAL, ADULT
Anesthesia: General | Site: Abdomen | Wound class: Clean

## 2012-08-31 MED ORDER — LIDOCAINE HCL 4 % MT SOLN
OROMUCOSAL | Status: DC | PRN
  Administered 2012-08-31: 4 mL via TOPICAL

## 2012-08-31 MED ORDER — PROMETHAZINE HCL 25 MG/ML IJ SOLN: 6.2500 mg | INTRAMUSCULAR | Status: DC | PRN

## 2012-08-31 MED ORDER — MIDAZOLAM HCL 5 MG/5ML IJ SOLN
INTRAMUSCULAR | Status: DC | PRN
  Administered 2012-08-31: 1 mg via INTRAVENOUS

## 2012-08-31 MED ORDER — ONDANSETRON HCL 4 MG/2ML IJ SOLN: 4.0000 mg | INTRAMUSCULAR | Status: DC | PRN

## 2012-08-31 MED ORDER — HYDROMORPHONE HCL PF 1 MG/ML IJ SOLN
0.2500 mg | INTRAMUSCULAR | Status: DC | PRN
  Administered 2012-08-31 (×2): 0.5 mg via INTRAVENOUS

## 2012-08-31 MED ORDER — MORPHINE SULFATE 2 MG/ML IJ SOLN: 2.0000 mg | INTRAMUSCULAR | Status: DC | PRN

## 2012-08-31 MED ORDER — FENTANYL CITRATE 0.05 MG/ML IJ SOLN: 50.0000 ug | INTRAMUSCULAR | Status: DC | PRN

## 2012-08-31 MED ORDER — FENTANYL CITRATE 0.05 MG/ML IJ SOLN
INTRAMUSCULAR | Status: DC | PRN
  Administered 2012-08-31: 100 ug via INTRAVENOUS

## 2012-08-31 MED ORDER — LACTATED RINGERS IV SOLN
INTRAVENOUS | Status: DC
  Administered 2012-08-31 (×2): via INTRAVENOUS

## 2012-08-31 MED ORDER — BUPIVACAINE-EPINEPHRINE 0.25% -1:200000 IJ SOLN
INTRAMUSCULAR | Status: DC | PRN
  Administered 2012-08-31: 10 mL

## 2012-08-31 MED ORDER — DEXTROSE 5 % IV SOLN
3.0000 g | INTRAVENOUS | Status: AC
  Administered 2012-08-31: 3 g via INTRAVENOUS

## 2012-08-31 MED ORDER — LIDOCAINE HCL (CARDIAC) 20 MG/ML IV SOLN
INTRAVENOUS | Status: DC | PRN
  Administered 2012-08-31: 50 mg via INTRAVENOUS

## 2012-08-31 MED ORDER — OXYCODONE HCL 5 MG/5ML PO SOLN: 5.0000 mg | Freq: Once | ORAL | Status: AC | PRN

## 2012-08-31 MED ORDER — OXYCODONE-ACETAMINOPHEN 5-325 MG PO TABS: 1.0000 | ORAL_TABLET | ORAL | Status: DC | PRN

## 2012-08-31 MED ORDER — OXYCODONE HCL 5 MG PO TABS
5.0000 mg | ORAL_TABLET | Freq: Once | ORAL | Status: AC | PRN
  Administered 2012-08-31: 5 mg via ORAL

## 2012-08-31 MED ORDER — SUCCINYLCHOLINE CHLORIDE 20 MG/ML IJ SOLN
INTRAMUSCULAR | Status: DC | PRN
  Administered 2012-08-31: 160 mg via INTRAVENOUS

## 2012-08-31 MED ORDER — MIDAZOLAM HCL 2 MG/2ML IJ SOLN: 1.0000 mg | INTRAMUSCULAR | Status: DC | PRN

## 2012-08-31 MED ORDER — CHLORHEXIDINE GLUCONATE 4 % EX LIQD: 1.0000 "application " | Freq: Once | CUTANEOUS | Status: DC

## 2012-08-31 MED ORDER — ONDANSETRON HCL 4 MG/2ML IJ SOLN
INTRAMUSCULAR | Status: DC | PRN
  Administered 2012-08-31: 4 mg via INTRAVENOUS

## 2012-08-31 MED ORDER — MIDAZOLAM HCL 5 MG/5ML IJ SOLN: INTRAMUSCULAR | Status: DC | PRN

## 2012-08-31 MED ORDER — DEXAMETHASONE SODIUM PHOSPHATE 4 MG/ML IJ SOLN
INTRAMUSCULAR | Status: DC | PRN
  Administered 2012-08-31: 10 mg via INTRAVENOUS

## 2012-08-31 MED ORDER — PROPOFOL 10 MG/ML IV BOLUS
INTRAVENOUS | Status: DC | PRN
  Administered 2012-08-31: 200 mg via INTRAVENOUS

## 2012-08-31 SURGICAL SUPPLY — 61 items
APL SKNCLS STERI-STRIP NONHPOA (GAUZE/BANDAGES/DRESSINGS) ×1
APPLICATOR COTTON TIP 6IN STRL (MISCELLANEOUS) IMPLANT
BENZOIN TINCTURE PRP APPL 2/3 (GAUZE/BANDAGES/DRESSINGS) ×1 IMPLANT
BLADE HEX COATED 2.75 (ELECTRODE) ×2 IMPLANT
BLADE SURG 15 STRL LF DISP TIS (BLADE) ×1 IMPLANT
BLADE SURG 15 STRL SS (BLADE) ×2
BLADE SURG ROTATE 9660 (MISCELLANEOUS) ×2 IMPLANT
CANISTER SUCTION 1200CC (MISCELLANEOUS) IMPLANT
CHLORAPREP W/TINT 26ML (MISCELLANEOUS) ×2 IMPLANT
CLOTH BEACON ORANGE TIMEOUT ST (SAFETY) ×2 IMPLANT
COVER MAYO STAND STRL (DRAPES) ×2 IMPLANT
COVER TABLE BACK 60X90 (DRAPES) ×2 IMPLANT
DECANTER SPIKE VIAL GLASS SM (MISCELLANEOUS) ×2 IMPLANT
DRAPE LAPAROTOMY T 102X78X121 (DRAPES) ×2 IMPLANT
DRAPE UTILITY XL STRL (DRAPES) ×2 IMPLANT
DRSG TEGADERM 4X4.75 (GAUZE/BANDAGES/DRESSINGS) ×2 IMPLANT
ELECT REM PT RETURN 9FT ADLT (ELECTROSURGICAL) ×2
ELECTRODE REM PT RTRN 9FT ADLT (ELECTROSURGICAL) ×1 IMPLANT
GAUZE SPONGE 4X4 12PLY STRL LF (GAUZE/BANDAGES/DRESSINGS) ×3 IMPLANT
GLOVE BIO SURGEON STRL SZ7 (GLOVE) ×2 IMPLANT
GLOVE BIO SURGEON STRL SZ7.5 (GLOVE) ×1 IMPLANT
GLOVE BIOGEL M 7.0 STRL (GLOVE) ×1 IMPLANT
GLOVE BIOGEL PI IND STRL 7.5 (GLOVE) ×1 IMPLANT
GLOVE BIOGEL PI IND STRL 8 (GLOVE) IMPLANT
GLOVE BIOGEL PI INDICATOR 7.5 (GLOVE) ×3
GLOVE BIOGEL PI INDICATOR 8 (GLOVE) ×1
GOWN PREVENTION PLUS XLARGE (GOWN DISPOSABLE) ×4 IMPLANT
GOWN PREVENTION PLUS XXLARGE (GOWN DISPOSABLE) ×1 IMPLANT
NDL HYPO 25X1 1.5 SAFETY (NEEDLE) IMPLANT
NDL SAFETY ECLIPSE 18X1.5 (NEEDLE) IMPLANT
NEEDLE HYPO 18GX1.5 SHARP (NEEDLE)
NEEDLE HYPO 25X1 1.5 SAFETY (NEEDLE) ×2 IMPLANT
NS IRRIG 1000ML POUR BTL (IV SOLUTION) ×1 IMPLANT
PACK BASIN DAY SURGERY FS (CUSTOM PROCEDURE TRAY) ×2 IMPLANT
PATCH VENTRAL MEDIUM 6.4 (Mesh Specialty) ×1 IMPLANT
PENCIL BUTTON HOLSTER BLD 10FT (ELECTRODE) ×2 IMPLANT
SLEEVE SCD COMPRESS KNEE MED (MISCELLANEOUS) ×1 IMPLANT
STAPLER VISISTAT 35W (STAPLE) IMPLANT
STRIP CLOSURE SKIN 1/2X4 (GAUZE/BANDAGES/DRESSINGS) ×1 IMPLANT
SUT MNCRL AB 4-0 PS2 18 (SUTURE) ×2 IMPLANT
SUT NOVA NAB DX-16 0-1 5-0 T12 (SUTURE) ×2 IMPLANT
SUT NOVA NAB GS-21 0 18 T12 DT (SUTURE) ×2 IMPLANT
SUT PDS AB 0 CT 36 (SUTURE) IMPLANT
SUT PROLENE 0 CT 1 30 (SUTURE) IMPLANT
SUT PROLENE 0 CT 1 CR/8 (SUTURE) IMPLANT
SUT SILK 3 0 TIES 17X18 (SUTURE)
SUT SILK 3-0 18XBRD TIE BLK (SUTURE) IMPLANT
SUT VIC AB 2-0 CT1 27 (SUTURE)
SUT VIC AB 2-0 CT1 TAPERPNT 27 (SUTURE) IMPLANT
SUT VIC AB 3-0 SH 27 (SUTURE) ×4
SUT VIC AB 3-0 SH 27X BRD (SUTURE) ×1 IMPLANT
SUT VIC AB 4-0 BRD 54 (SUTURE) IMPLANT
SUT VIC AB 4-0 SH 18 (SUTURE) IMPLANT
SUT VICRYL 4-0 PS2 18IN ABS (SUTURE) IMPLANT
SYR BULB 3OZ (MISCELLANEOUS) IMPLANT
SYR CONTROL 10ML LL (SYRINGE) ×1 IMPLANT
TOWEL OR 17X24 6PK STRL BLUE (TOWEL DISPOSABLE) ×3 IMPLANT
TOWEL OR NON WOVEN STRL DISP B (DISPOSABLE) ×2 IMPLANT
TUBE CONNECTING 20X1/4 (TUBING) IMPLANT
WATER STERILE IRR 1000ML POUR (IV SOLUTION) ×1 IMPLANT
YANKAUER SUCT BULB TIP NO VENT (SUCTIONS) IMPLANT

## 2012-08-31 NOTE — Interval H&P Note (Signed)
History and Physical Interval Note:  08/31/2012 11:04 AM  Robert [name]  has presented today for surgery, with the diagnosis of umbilical hernia  The various methods of treatment have been discussed with the patient and family. After consideration of risks, benefits and other options for treatment, the patient has consented to  Procedure(s) (LRB) with comments: HERNIA REPAIR UMBILICAL ADULT (N/A) - umbilical hernia repair with mesh INSERTION OF MESH (N/A) as a surgical intervention .  The patient's history has been reviewed, patient examined, no change in status, stable for surgery.  I have reviewed the patient's chart and labs.  Questions were answered to the patient's satisfaction.     Lusia Greis K.

## 2012-08-31 NOTE — Anesthesia Procedure Notes (Signed)
Procedure Name: Intubation Date/Time: 08/31/2012 11:33 AM Performed by: Meyer Russel Pre-anesthesia Checklist: Patient identified, Emergency Drugs available, Suction available and Patient being monitored Patient Re-evaluated:Patient Re-evaluated prior to inductionOxygen Delivery Method: Circle System Utilized Preoxygenation: Pre-oxygenation with 100% oxygen Intubation Type: IV induction and Cricoid Pressure applied Ventilation: Mask ventilation without difficulty Laryngoscope Size: Mac and 4 Grade View: Grade II Tube type: Oral Number of attempts: 1 Airway Equipment and Method: stylet and LTA kit utilized Placement Confirmation: ETT inserted through vocal cords under direct vision,  positive ETCO2 and breath sounds checked- equal and bilateral Secured at: 24 cm Tube secured with: Tape Dental Injury: Teeth and Oropharynx as per pre-operative assessment

## 2012-08-31 NOTE — H&P (View-Only) (Signed)
Patient ID: Robert [name], male   DOB: [birth year], 56 y.o.   MRN: [id number]  Chief Complaint  Patient presents with  . Re-evaluation    abd hernia    HPI Robert [name] is a 56 y.o. male.  Referred by Dr. Knox Royalty for enlarging umbilical hernia HPI HPI  Robert [name] is a 56 y.o. male. Referred by Dr. Knox Royalty for evaluation of umbilical hernia  HPI  This is a 56 year old male who presents with a history of a slowly enlarging umbilical hernia. This has been present for several years. It occasionally becomes painful and uncomfortable. However when he is supine he is able to partially reduce this hernia. He denies any obstructive symptoms. No nausea or vomiting. He was recently evaluated by Dr. Rennis Golden with [facility] and [facility]. Cardiac clearance is pending after recent cardiac cath, which was normal.  Past Medical History  Diagnosis Date  . Hyperlipidemia   . Hypertension     Past Surgical History  Procedure Date  . Cardiac surgery 2006    stent placement  . Knee surgery 2009/2010    right  . Knee surgery 2010    right knee partial replacement    Family History  Problem Relation Age of Onset  . Heart disease Father   . Cancer Paternal Uncle     prostate  . Cancer Paternal Uncle     prostate    Social History History  Substance Use Topics  . Smoking status: Never Smoker   . Smokeless tobacco: Not on file  . Alcohol Use: Yes    No Known Allergies  Current Outpatient Prescriptions  Medication Sig Dispense Refill  . aspirin EC 325 MG tablet Take 325 mg by mouth daily.      . benazepril-hydrochlorthiazide (LOTENSIN HCT) 20-12.5 MG per tablet Take 2 tablets by mouth daily. Takes 2 pills at once      . NIFEdipine (ADALAT CC) 90 MG 24 hr tablet Take 90 mg by mouth daily.      . rosuvastatin (CRESTOR) 20 MG tablet Take 20 mg by mouth daily.        Review of Systems Review of Systems  Constitutional: Negative for fever, chills and  unexpected weight change.  HENT: Negative for hearing loss, congestion, sore throat, trouble swallowing and voice change.   Eyes: Negative for visual disturbance.  Respiratory: Negative for cough and wheezing.   Cardiovascular: Negative for chest pain, palpitations and leg swelling.  Gastrointestinal: Negative for nausea, vomiting, abdominal pain, diarrhea, constipation, blood in stool, abdominal distention, anal bleeding and rectal pain.  Genitourinary: Negative for hematuria and difficulty urinating.  Musculoskeletal: Negative for arthralgias.  Skin: Negative for rash and wound.  Neurological: Negative for seizures, syncope, weakness and headaches.  Hematological: Negative for adenopathy. Does not bruise/bleed easily.  Psychiatric/Behavioral: Negative for confusion.    Blood pressure 142/68, pulse 72, temperature 97.5 F (36.4 C), temperature source Temporal, resp. rate 20, height 6\' 4"  (1.93 m), weight 323 lb 3.2 oz (146.603 kg).  Physical Exam Physical Exam WDWN in NAD HEENT:  EOMI, sclera anicteric Neck:  No masses, no thyromegaly Lungs:  CTA bilaterally; normal respiratory effort CV:  Regular rate and rhythm; no murmurs Abd:  +bowel sounds, soft, non-tender, large protruding umbilicus - hernia sac 6 cm across, defect about 2.5 cm across Ext:  Well-perfused; no edema Skin:  Warm, dry; no sign of jaundice  Data Reviewed none  Assessment    Umbilical hernia - enlarging, reducible  Plan    Umbilical hernia repair with mesh.  The surgical procedure has been discussed with the patient.  Potential risks, benefits, alternative treatments, and expected outcomes have been explained.  All of the patient's questions at this time have been answered.  The likelihood of reaching the patient's treatment goal is good.  The patient understand the proposed surgical procedure and wishes to proceed.        Robert Dolman K. 08/02/2012, 3:09 PM

## 2012-08-31 NOTE — Anesthesia Preprocedure Evaluation (Addendum)
Anesthesia Evaluation  Patient identified by MRN, date of birth, ID band Patient awake    Reviewed: Allergy & Precautions, H&P , NPO status , Patient's Chart, lab work & pertinent test results  History of Anesthesia Complications Negative for: history of anesthetic complications  Airway Mallampati: III TM Distance: >3 FB Neck ROM: Full    Dental  (+) Teeth Intact and Dental Advisory Given   Pulmonary neg pulmonary ROS,    Pulmonary exam normal       Cardiovascular hypertension, Pt. on medications     Neuro/Psych negative neurological ROS     GI/Hepatic Neg liver ROS,   Endo/Other  Morbid obesity  Renal/GU negative Renal ROS     Musculoskeletal   Abdominal   Peds  Hematology negative hematology ROS (+)   Anesthesia Other Findings   Reproductive/Obstetrics                          Anesthesia Physical Anesthesia Plan  ASA: III  Anesthesia Plan: General   Post-op Pain Management:    Induction: Intravenous  Airway Management Planned: Oral ETT  Additional Equipment:   Intra-op Plan:   Post-operative Plan: Extubation in OR  Informed Consent: I have reviewed the patients History and Physical, chart, labs and discussed the procedure including the risks, benefits and alternatives for the proposed anesthesia with the patient or authorized representative who has indicated his/her understanding and acceptance.   Dental advisory given  Plan Discussed with: CRNA, Surgeon and Anesthesiologist  Anesthesia Plan Comments:        Anesthesia Quick Evaluation

## 2012-08-31 NOTE — Transfer of Care (Signed)
Immediate Anesthesia Transfer of Care Note  Patient: Robert [name]  Procedure(s) Performed: Procedure(s) (LRB) with comments: HERNIA REPAIR UMBILICAL ADULT (N/A) - umbilical hernia repair with mesh INSERTION OF MESH (N/A)  Patient Location: PACU  Anesthesia Type:General  Level of Consciousness: awake, alert  and oriented  Airway & Oxygen Therapy: Patient Spontanous Breathing and Patient connected to face mask oxygen  Post-op Assessment: Report given to PACU RN, Post -op Vital signs reviewed and stable and Patient moving all extremities  Post vital signs: Reviewed and stable  Complications: No apparent anesthesia complications

## 2012-08-31 NOTE — Anesthesia Postprocedure Evaluation (Signed)
Anesthesia Post Note  Patient: Robert [name]  Procedure(s) Performed: Procedure(s) (LRB): HERNIA REPAIR UMBILICAL ADULT (N/A) INSERTION OF MESH (N/A)  Anesthesia type: general  Patient location: PACU  Post pain: Pain level controlled  Post assessment: Patient's Cardiovascular Status Stable  Last Vitals:  Filed Vitals:   08/31/12 1320  BP:   Pulse: 73  Temp:   Resp: 16    Post vital signs: Reviewed and stable  Level of consciousness: sedated  Complications: No apparent anesthesia complications

## 2012-08-31 NOTE — Op Note (Signed)
Indications:  The patient presented with a history of a large reducible umbilical hernia.  The patient was examined and we recommended umbilical hernia repair with mesh.  Pre-operative diagnosis:  Umbilical hernia  Post-operative diagnosis:  Same  Surgeon: Ainslee Sou K.   Assistants: none  Anesthesia: General endotracheal anesthesia  ASA Class: 2   Procedure Details  The patient was seen again in the Holding Room. The risks, benefits, complications, treatment options, and expected outcomes were discussed with the patient. The possibilities of reaction to medication, pulmonary aspiration, perforation of viscus, bleeding, recurrent infection, the need for additional procedures, and development of a complication requiring transfusion or further operation were discussed with the patient and/or family. There was concurrence with the proposed plan, and informed consent was obtained. The site of surgery was properly noted/marked. The patient was taken to the Operating Room, identified as Robert [name], and the procedure verified as umbilical hernia repair. A Time Out was held and the above information confirmed.  After an adequate level of general anesthesia was obtained, the patient's abdomen was prepped with Chloraprep and draped in sterile fashion.  We made a transverse incision above the umbilicus.  Dissection was carried down to the large hernia sac with cautery.  The hernia sac was peeled off of the back of the umbilical dermis. We dissected bluntly around the hernia sac down to the edge of the fascial defect.  The hernia sac was opened and contained only some omentum.  We excised some of the excess hernia sac and closed the sac with 2-0 Vicryl.  The fascial defect measured 3 cm.  We cleared the fascia in all directions anterior and posterior to the fascia..  A medium Proceed ventral patch was inserted into the pre-peritoneal space and was deployed.  The mesh was secured with eight  trans-fascial sutures of 0 Novofil.  The fascial defect was closed with multiple interrupted figure-of-eight 1 Novofil sutures.  The base of the umbilicus was tacked down with 3-0 Vicryl.  3-0 Vicryl was used to close the subcutaneous tissues and 4-0 Monocryl was used to close the skin.  Steri-strips and clean dressing were applied.  The patient was extubated and brought to the recovery room in stable condition.  All sponge, instrument, and needle counts were correct prior to closure and at the conclusion of the case.   Estimated Blood Loss: Minimal          Complications: None; patient tolerated the procedure well.         Disposition: PACU - hemodynamically stable.         Condition: stable  Wilmon Arms. Corliss Skains, MD, [facility]  08/31/2012 12:47 PM

## 2012-09-01 ENCOUNTER — Encounter (HOSPITAL_BASED_OUTPATIENT_CLINIC_OR_DEPARTMENT_OTHER): Payer: Self-pay | Admitting: Surgery

## 2012-09-08 ENCOUNTER — Encounter (INDEPENDENT_AMBULATORY_CARE_PROVIDER_SITE_OTHER): Payer: Self-pay | Admitting: Surgery

## 2012-09-08 ENCOUNTER — Ambulatory Visit (INDEPENDENT_AMBULATORY_CARE_PROVIDER_SITE_OTHER): Payer: BC Managed Care – PPO | Admitting: Surgery

## 2012-09-08 VITALS — BP 117/80 | HR 75 | Temp 97.6°F | Resp 16 | Ht 76.0 in | Wt 314.6 lb

## 2012-09-08 DIAGNOSIS — K429 Umbilical hernia without obstruction or gangrene: Secondary | ICD-10-CM

## 2012-09-08 MED ORDER — OXYCODONE-ACETAMINOPHEN 5-325 MG PO TABS: 1.0000 | ORAL_TABLET | ORAL | Status: DC | PRN

## 2012-09-08 NOTE — Progress Notes (Signed)
Status post open repair of an umbilical hernia with mesh on 08/31/12. The patient had a 3 cm defect repaired with a medium proceed ventral patch. His incision seems to be healing well. There is a bit of seroma underneath the skin but no erythema or induration. No drainage noted. The Steri-Strips are removed. The patient should continue limiting his level of activity for another couple of weeks. We will recheck him in 2 weeks to make sure that the seroma has resolved.  Robert [name]. Corliss Skains, MD, [facility]  09/08/2012 11:07 AM

## 2012-09-23 ENCOUNTER — Encounter (INDEPENDENT_AMBULATORY_CARE_PROVIDER_SITE_OTHER): Payer: BC Managed Care – PPO | Admitting: Surgery

## 2012-10-03 ENCOUNTER — Encounter (INDEPENDENT_AMBULATORY_CARE_PROVIDER_SITE_OTHER): Payer: Self-pay | Admitting: Surgery

## 2012-10-03 ENCOUNTER — Ambulatory Visit (INDEPENDENT_AMBULATORY_CARE_PROVIDER_SITE_OTHER): Payer: BC Managed Care – PPO | Admitting: Surgery

## 2012-10-03 VITALS — BP 142/88 | HR 68 | Temp 98.4°F | Resp 18 | Ht 76.0 in | Wt 314.4 lb

## 2012-10-03 DIAGNOSIS — K429 Umbilical hernia without obstruction or gangrene: Secondary | ICD-10-CM

## 2012-10-03 NOTE — Progress Notes (Signed)
Recheck of his umbilical hernia after surgery on 08/31/12. The seroma that was noted at the last visit seems to be smaller. No sign of recurrent hernia. The incision is well healed. Minimal tenderness. The patient may resume flow to the beginning next week. Followup with [place] as needed.  Robert [name]. Corliss Skains, MD, [facility] Surgery  10/03/2012 9:59 AM

## 2012-10-11 ENCOUNTER — Encounter (INDEPENDENT_AMBULATORY_CARE_PROVIDER_SITE_OTHER): Payer: Self-pay | Admitting: General Surgery

## 2012-10-11 ENCOUNTER — Telehealth (INDEPENDENT_AMBULATORY_CARE_PROVIDER_SITE_OTHER): Payer: Self-pay | Admitting: General Surgery

## 2012-10-11 NOTE — Telephone Encounter (Signed)
Pt called from work to ask for a RTW note for HR to close his short term disability.  Letter FAXd to attn:  Theora Master at [phone].

## 2014-08-02 ENCOUNTER — Other Ambulatory Visit: Payer: Self-pay | Admitting: Family Medicine

## 2014-08-02 DIAGNOSIS — R748 Abnormal levels of other serum enzymes: Secondary | ICD-10-CM

## 2014-08-10 ENCOUNTER — Ambulatory Visit
Admission: RE | Admit: 2014-08-10 | Discharge: 2014-08-10 | Disposition: A | Discharge status: Home / Self Care | Payer: BC Managed Care – PPO | Source: Ambulatory Visit | Attending: Family Medicine | Admitting: Family Medicine

## 2014-08-10 ENCOUNTER — Other Ambulatory Visit: Payer: Self-pay | Admitting: Family Medicine

## 2014-08-10 DIAGNOSIS — R748 Abnormal levels of other serum enzymes: Secondary | ICD-10-CM

## 2014-08-10 DIAGNOSIS — R935 Abnormal findings on diagnostic imaging of other abdominal regions, including retroperitoneum: Secondary | ICD-10-CM

## 2014-08-14 ENCOUNTER — Ambulatory Visit
Admission: RE | Admit: 2014-08-14 | Discharge: 2014-08-14 | Disposition: A | Discharge status: Home / Self Care | Payer: BC Managed Care – PPO | Source: Ambulatory Visit | Attending: Family Medicine | Admitting: Family Medicine

## 2014-08-14 DIAGNOSIS — R935 Abnormal findings on diagnostic imaging of other abdominal regions, including retroperitoneum: Secondary | ICD-10-CM

## 2014-08-14 MED ORDER — IOHEXOL 300 MG/ML  SOLN
150.0000 mL | Freq: Once | INTRAMUSCULAR | Status: AC | PRN
  Administered 2014-08-14: 150 mL via INTRAVENOUS

## 2014-08-30 ENCOUNTER — Encounter (HOSPITAL_COMMUNITY): Payer: Self-pay | Admitting: Internal Medicine

## 2016-07-27 ENCOUNTER — Other Ambulatory Visit: Payer: Self-pay | Admitting: Family Medicine

## 2016-07-27 DIAGNOSIS — I714 Abdominal aortic aneurysm, without rupture, unspecified: Secondary | ICD-10-CM

## 2016-08-18 ENCOUNTER — Other Ambulatory Visit: Payer: Self-pay

## 2016-08-26 ENCOUNTER — Ambulatory Visit
Admission: RE | Admit: 2016-08-26 | Discharge: 2016-08-26 | Disposition: A | Discharge status: Home / Self Care | Payer: 59 | Source: Ambulatory Visit | Attending: Family Medicine | Admitting: Family Medicine

## 2016-08-26 DIAGNOSIS — I714 Abdominal aortic aneurysm, without rupture, unspecified: Secondary | ICD-10-CM

## 2016-09-30 DIAGNOSIS — Z1211 Encounter for screening for malignant neoplasm of colon: Secondary | ICD-10-CM | POA: Diagnosis not present

## 2016-10-13 ENCOUNTER — Encounter: Payer: Self-pay | Admitting: Surgery

## 2016-10-13 ENCOUNTER — Encounter: Payer: 59 | Admitting: Vascular Surgery

## 2016-10-16 ENCOUNTER — Ambulatory Visit (INDEPENDENT_AMBULATORY_CARE_PROVIDER_SITE_OTHER): Payer: 59 | Admitting: Surgery

## 2016-10-16 VITALS — BP 134/88 | HR 66 | Temp 97.8°F | Resp 18 | Ht 76.0 in | Wt 315.0 lb

## 2016-10-16 DIAGNOSIS — I714 Abdominal aortic aneurysm, without rupture, unspecified: Secondary | ICD-10-CM

## 2016-10-16 NOTE — Progress Notes (Signed)
Vascular and Vein Specialist of [place]  Patient name: Robert [name] MRN: [id number] DOB: Jul 13, 1956 Sex: male   REFERRING PROVIDER:    Dr. Kenton Kingfisher   REASON FOR CONSULT:    AAA  HISTORY OF PRESENT ILLNESS:   Robert [name] is a 61 y.o. male, who is Referred today for evaluation of an abdominal aortic aneurysm.  This was initially noted on Dr. Christy Sartorius angiogram report in 2006 when the patient underwent bilateral renal artery angioplasty for fibromuscular dysplasia.  In 2015, he was being evaluated for an umbilical hernia and had a CT scan which showed a 3.8 cm infrarenal abdominal aortic aneurysm.  He most recently had an abdominal ultrasound which revealed an increase in size up to 4.8 cm, however the accuracy of this study is limited given the tortuosity of the aorta.  The patient denies any abdominal pain or back pain.  The patient suffers from hypercholesterolemia which is managed with a statin.  He takes an ACE inhibitor for hypertension.  He is a nonsmoker.  PAST MEDICAL HISTORY    Past Medical History:  Diagnosis Date  . Hyperlipidemia   . Hypertension      FAMILY HISTORY   Family History  Problem Relation Age of Onset  . Heart disease Father   . Cancer Paternal Uncle     prostate  . Cancer Paternal Uncle     prostate    SOCIAL HISTORY:   Social History   Social History  . Marital status: Married    Spouse name: N/A  . Number of children: N/A  . Years of education: N/A   Occupational History  . Not on file.   Social History Main Topics  . Smoking status: Never Smoker  . Smokeless tobacco: Not on file  . Alcohol use Yes  . Drug use: No  . Sexual activity: Not on file   Other Topics Concern  . Not on file   Social History Narrative  . No narrative on file    ALLERGIES:    No Known Allergies  CURRENT MEDICATIONS:    Current Outpatient Prescriptions  Medication Sig Dispense Refill  .  aspirin EC 325 MG tablet Take 325 mg by mouth daily.    . benazepril-hydrochlorthiazide (LOTENSIN HCT) 20-12.5 MG per tablet Take 2 tablets by mouth daily. Takes 2 pills at once    . NIFEdipine (ADALAT CC) 90 MG 24 hr tablet Take 90 mg by mouth daily.    . rosuvastatin (CRESTOR) 20 MG tablet Take 20 mg by mouth daily.     No current facility-administered medications for this visit.     REVIEW OF SYSTEMS:   [X]  denotes positive finding, [ ]  denotes negative finding Cardiac  Comments:  Chest pain or chest pressure:    Shortness of breath upon exertion:    Short of breath when lying flat:    Irregular heart rhythm:        Vascular    Pain in calf, thigh, or hip brought on by ambulation:    Pain in feet at night that wakes you up from your sleep:     Blood clot in your veins:    Leg swelling:         Pulmonary    Oxygen at home:    Productive cough:     Wheezing:         Neurologic    Sudden weakness in arms or legs:     Sudden numbness in arms or legs:  Sudden onset of difficulty speaking or slurred speech:    Temporary loss of vision in one eye:     Problems with dizziness:         Gastrointestinal    Blood in stool:      Vomited blood:         Genitourinary    Burning when urinating:     Blood in urine:        Psychiatric    Major depression:         Hematologic    Bleeding problems:    Problems with blood clotting too easily:        Skin    Rashes or ulcers:        Constitutional    Fever or chills:     PHYSICAL EXAM:   Vitals:   10/16/16 0835  BP: 134/88  Pulse: 66  Resp: 18  Temp: 97.8 F (36.6 C)  TempSrc: Oral  SpO2: 96%  Weight: (!) 315 lb (142.9 kg)  Height: 6\' 4"  (1.93 m)    GENERAL: The patient is a well-nourished male, in no acute distress. The vital signs are documented above. CARDIAC: There is a regular rate and rhythm.  VASCULAR: No carotid bruits.  Palpable pedal pulses. PULMONARY: Nonlabored respirations ABDOMEN: Soft and  non-tender with normal pitched bowel sounds.  No pulsatile mass MUSCULOSKELETAL: There are no major deformities or cyanosis. NEUROLOGIC: No focal weakness or paresthesias are detected. SKIN: There are no ulcers or rashes noted. PSYCHIATRIC: The patient has a normal affect.  STUDIES:   CT scan (2015: Very tortuous infrarenal abdominal aorta with abdominal aortic aneurysm, 3.8 cm Abdominal ultrasound: 4.8 cm infrarenal abdominal aortic aneurysm  ASSESSMENT and PLAN   AAA: I discussed with the patient that the indications for repair would be size greater than 5 cm.  I reviewed the CT scan from 2015 as well as his ultrasound with him.  I suspect the ultrasound has over estimated the size of the aneurysm, because of the tortuosity making it difficult to get accurate measurements.  However, even if it is accurate he still does not meet size criteria for repair.  I have elected to have him follow up again with me in 6 months, this time with a CT angiogram.  All his questions were answered today.  I discussed with him that the risk of rupture is extremely low given a aneurysm with size less than 5 cm.  He has no activity restrictions.  He will continue his current medical regimen.   Annamarie Major, MD [facility] and [facility] Specialists of [facility] [phone] Pager [phone]

## 2016-10-20 NOTE — Addendum Note (Signed)
Addended by: Lianne Cure A on: 10/20/2016 04:49 PM   Modules accepted: Orders

## 2016-11-16 DIAGNOSIS — J309 Allergic rhinitis, unspecified: Secondary | ICD-10-CM | POA: Diagnosis not present

## 2016-12-25 DIAGNOSIS — M1712 Unilateral primary osteoarthritis, left knee: Secondary | ICD-10-CM | POA: Diagnosis not present

## 2016-12-25 DIAGNOSIS — M25562 Pain in left knee: Secondary | ICD-10-CM | POA: Diagnosis not present

## 2017-04-09 ENCOUNTER — Encounter: Payer: Self-pay | Admitting: Surgery

## 2017-04-12 ENCOUNTER — Other Ambulatory Visit: Payer: 59

## 2017-04-12 ENCOUNTER — Inpatient Hospital Stay: Admission: RE | Admit: 2017-04-12 | Payer: 59 | Source: Ambulatory Visit

## 2017-04-19 ENCOUNTER — Ambulatory Visit: Payer: 59 | Admitting: Surgery

## 2017-05-19 ENCOUNTER — Encounter: Payer: Self-pay | Admitting: Surgery

## 2017-05-31 ENCOUNTER — Ambulatory Visit: Payer: 59 | Admitting: Surgery

## 2017-05-31 ENCOUNTER — Other Ambulatory Visit: Payer: 59

## 2017-07-01 DIAGNOSIS — L739 Follicular disorder, unspecified: Secondary | ICD-10-CM | POA: Diagnosis not present

## 2017-07-01 DIAGNOSIS — N342 Other urethritis: Secondary | ICD-10-CM | POA: Diagnosis not present

## 2017-08-13 DIAGNOSIS — Z23 Encounter for immunization: Secondary | ICD-10-CM | POA: Diagnosis not present

## 2017-08-20 DIAGNOSIS — M1712 Unilateral primary osteoarthritis, left knee: Secondary | ICD-10-CM | POA: Diagnosis not present

## 2017-08-20 DIAGNOSIS — M25562 Pain in left knee: Secondary | ICD-10-CM | POA: Diagnosis not present

## 2017-09-30 DIAGNOSIS — M179 Osteoarthritis of knee, unspecified: Secondary | ICD-10-CM | POA: Insufficient documentation

## 2017-12-30 ENCOUNTER — Other Ambulatory Visit: Payer: Self-pay

## 2017-12-30 DIAGNOSIS — I713 Abdominal aortic aneurysm, ruptured, unspecified: Secondary | ICD-10-CM

## 2018-01-31 ENCOUNTER — Other Ambulatory Visit: Payer: 59

## 2018-02-04 DIAGNOSIS — M1711 Unilateral primary osteoarthritis, right knee: Secondary | ICD-10-CM | POA: Diagnosis not present

## 2018-02-04 DIAGNOSIS — M25561 Pain in right knee: Secondary | ICD-10-CM | POA: Diagnosis not present

## 2018-02-04 DIAGNOSIS — M1712 Unilateral primary osteoarthritis, left knee: Secondary | ICD-10-CM | POA: Diagnosis not present

## 2018-02-07 ENCOUNTER — Ambulatory Visit: Payer: 59 | Admitting: Surgery

## 2018-02-18 ENCOUNTER — Ambulatory Visit
Admission: RE | Admit: 2018-02-18 | Discharge: 2018-02-18 | Disposition: A | Discharge status: Home / Self Care | Payer: 59 | Source: Ambulatory Visit | Attending: Surgery | Admitting: Surgery

## 2018-02-18 DIAGNOSIS — I713 Abdominal aortic aneurysm, ruptured, unspecified: Secondary | ICD-10-CM

## 2018-02-18 DIAGNOSIS — I77811 Abdominal aortic ectasia: Secondary | ICD-10-CM | POA: Diagnosis not present

## 2018-02-18 DIAGNOSIS — I77810 Thoracic aortic ectasia: Secondary | ICD-10-CM | POA: Diagnosis not present

## 2018-02-18 DIAGNOSIS — Z01818 Encounter for other preprocedural examination: Secondary | ICD-10-CM | POA: Diagnosis not present

## 2018-02-18 MED ORDER — IOPAMIDOL (ISOVUE-370) INJECTION 76%
75.0000 mL | Freq: Once | INTRAVENOUS | Status: AC | PRN
  Administered 2018-02-18: 75 mL via INTRAVENOUS

## 2018-03-14 ENCOUNTER — Ambulatory Visit (INDEPENDENT_AMBULATORY_CARE_PROVIDER_SITE_OTHER): Payer: 59 | Admitting: Surgery

## 2018-03-14 ENCOUNTER — Encounter: Payer: Self-pay | Admitting: Surgery

## 2018-03-14 ENCOUNTER — Other Ambulatory Visit: Payer: Self-pay

## 2018-03-14 VITALS — BP 148/99 | HR 76 | Resp 20 | Ht 76.0 in | Wt 306.0 lb

## 2018-03-14 DIAGNOSIS — I714 Abdominal aortic aneurysm, without rupture, unspecified: Secondary | ICD-10-CM

## 2018-03-14 NOTE — Progress Notes (Signed)
Vascular and Vein Specialist of   Patient name: Robert [name] MRN: [id number] DOB: June 29, 1956 Sex: male   REASON FOR VISIT:    follow up  HISOTRY OF PRESENT ILLNESS:    Robert [name] is a 62 y.o. male, who returns today for evaluation of an abdominal aortic aneurysm.  This was initially noted on Dr. Christy Sartorius angiogram report in 2006 when the patient underwent bilateral renal artery angioplasty for fibromuscular dysplasia.  In 2015, he was being evaluated for an umbilical hernia and had a CT scan which showed a 3.8 cm infrarenal abdominal aortic aneurysm.  6 months ago, he had an abdominal ultrasound which revealed an increase in size up to 4.8 cm, however the accuracy of this study is limited given the tortuosity of the aorta.  He returns today for CTA follow up.  He denies any abdominal pain.  He denies any neurologic symptoms.  Specifically he denies numbness or weakness in either extremity.  He denies slurred speech.  He denies amaurosis fugax.  He does not have any symptoms of claudication.  The patient suffers from hypercholesterolemia which is managed with a statin.  He takes an ACE inhibitor for hypertension.  He is a nonsmoker.   PAST MEDICAL HISTORY:   Past Medical History:  Diagnosis Date  . Hyperlipidemia   . Hypertension      FAMILY HISTORY:   Family History  Problem Relation Age of Onset  . Heart disease Father   . Cancer Paternal Uncle        prostate  . Cancer Paternal Uncle        prostate    SOCIAL HISTORY:   Social History   Tobacco Use  . Smoking status: Never Smoker  . Smokeless tobacco: Never Used  Substance Use Topics  . Alcohol use: Yes     ALLERGIES:   No Known Allergies   CURRENT MEDICATIONS:   Current Outpatient Medications  Medication Sig Dispense Refill  . aspirin EC 325 MG tablet Take 325 mg by mouth daily.    . benazepril-hydrochlorthiazide (LOTENSIN HCT) 20-12.5 MG  per tablet Take 2 tablets by mouth daily. Takes 2 pills at once    . NIFEdipine (ADALAT CC) 90 MG 24 hr tablet Take 90 mg by mouth daily.    . rosuvastatin (CRESTOR) 20 MG tablet Take 20 mg by mouth daily.    . sildenafil (VIAGRA) 100 MG tablet      No current facility-administered medications for this visit.     REVIEW OF SYSTEMS:   [X]  denotes positive finding, [ ]  denotes negative finding Cardiac  Comments:  Chest pain or chest pressure:    Shortness of breath upon exertion:    Short of breath when lying flat:    Irregular heart rhythm:        Vascular    Pain in calf, thigh, or hip brought on by ambulation:    Pain in feet at night that wakes you up from your sleep:     Blood clot in your veins:    Leg swelling:         Pulmonary    Oxygen at home:    Productive cough:     Wheezing:         Neurologic    Sudden weakness in arms or legs:     Sudden numbness in arms or legs:     Sudden onset of difficulty speaking or slurred speech:    Temporary loss of vision in  one eye:     Problems with dizziness:         Gastrointestinal    Blood in stool:     Vomited blood:         Genitourinary    Burning when urinating:     Blood in urine:        Psychiatric    Major depression:         Hematologic    Bleeding problems:    Problems with blood clotting too easily:        Skin    Rashes or ulcers:        Constitutional    Fever or chills:      PHYSICAL EXAM:   Vitals:   03/14/18 0830 03/14/18 0831  BP: (!) 150/102 (!) 148/99  Pulse: 76   Resp: 20   SpO2: 96%   Weight: (!) 306 lb (138.8 kg)   Height: 6\' 4"  (1.93 m)     GENERAL: The patient is a well-nourished male, in no acute distress. The vital signs are documented above. CARDIAC: There is a regular rate and rhythm.  VASCULAR: No carotid bruits PULMONARY: Non-labored respirations ABDOMEN: Soft and non-tender with normal pitched bowel sounds.  MUSCULOSKELETAL: There are no major deformities or  cyanosis. NEUROLOGIC: No focal weakness or paresthesias are detected. SKIN: There are no ulcers or rashes noted. PSYCHIATRIC: The patient has a normal affect.  STUDIES:   I have reviewed the CT angiogram with the following findings: 1. 4.1 cm fusiform dilatation of a tortuous infrarenal abdominal aorta, previously 3.7 cm on 08/14/2014. 2. Fusiform dilatation of common iliac arteries 1.8 cm right, 1.9 cm left, with moderate tortuosity  MEDICAL ISSUES:   AAA: Maximal aortic diameter is 4.1 cm on CT angiogram today.  There is market tortuosity of the aorta which accounts for the abnormal measurements on his previous ultrasound.  I discussed with the patient that we will try to follow this with ultrasound, however the value of this may be limited because of the tortuosity.  We discussed the indications for repair would be size greater than 5 cm.  He will follow-up in 6 months with repeat ultrasound.    Annamarie Major, MD [facility] and [facility] Specialists of [facility] [phone] Pager [phone]

## 2018-03-31 ENCOUNTER — Other Ambulatory Visit: Payer: Self-pay

## 2018-03-31 DIAGNOSIS — I714 Abdominal aortic aneurysm, without rupture, unspecified: Secondary | ICD-10-CM

## 2018-08-11 DIAGNOSIS — I10 Essential (primary) hypertension: Secondary | ICD-10-CM | POA: Diagnosis not present

## 2018-08-11 DIAGNOSIS — E7800 Pure hypercholesterolemia, unspecified: Secondary | ICD-10-CM | POA: Diagnosis not present

## 2018-08-11 DIAGNOSIS — L918 Other hypertrophic disorders of the skin: Secondary | ICD-10-CM | POA: Diagnosis not present

## 2018-08-15 DIAGNOSIS — M25561 Pain in right knee: Secondary | ICD-10-CM | POA: Diagnosis not present

## 2018-08-15 DIAGNOSIS — M25551 Pain in right hip: Secondary | ICD-10-CM | POA: Diagnosis not present

## 2018-08-22 DIAGNOSIS — M25551 Pain in right hip: Secondary | ICD-10-CM | POA: Diagnosis not present

## 2018-09-13 ENCOUNTER — Ambulatory Visit: Payer: 59 | Admitting: Family

## 2018-09-13 ENCOUNTER — Other Ambulatory Visit (HOSPITAL_COMMUNITY): Payer: 59

## 2018-09-13 DIAGNOSIS — I10 Essential (primary) hypertension: Secondary | ICD-10-CM | POA: Diagnosis not present

## 2018-09-13 DIAGNOSIS — N529 Male erectile dysfunction, unspecified: Secondary | ICD-10-CM | POA: Diagnosis not present

## 2018-09-13 DIAGNOSIS — K0510 Chronic gingivitis, plaque induced: Secondary | ICD-10-CM | POA: Diagnosis not present

## 2018-09-16 DIAGNOSIS — M1611 Unilateral primary osteoarthritis, right hip: Secondary | ICD-10-CM | POA: Diagnosis not present

## 2018-09-16 DIAGNOSIS — M1711 Unilateral primary osteoarthritis, right knee: Secondary | ICD-10-CM | POA: Diagnosis not present

## 2018-09-16 DIAGNOSIS — M25551 Pain in right hip: Secondary | ICD-10-CM | POA: Diagnosis not present

## 2018-10-03 DIAGNOSIS — M1611 Unilateral primary osteoarthritis, right hip: Secondary | ICD-10-CM | POA: Insufficient documentation

## 2018-10-03 DIAGNOSIS — M25551 Pain in right hip: Secondary | ICD-10-CM | POA: Diagnosis not present

## 2018-10-04 DIAGNOSIS — M1611 Unilateral primary osteoarthritis, right hip: Secondary | ICD-10-CM | POA: Diagnosis not present

## 2018-11-02 DIAGNOSIS — M25551 Pain in right hip: Secondary | ICD-10-CM | POA: Diagnosis not present

## 2018-11-02 DIAGNOSIS — M7542 Impingement syndrome of left shoulder: Secondary | ICD-10-CM | POA: Diagnosis not present

## 2018-11-02 DIAGNOSIS — M1611 Unilateral primary osteoarthritis, right hip: Secondary | ICD-10-CM | POA: Diagnosis not present

## 2018-11-04 ENCOUNTER — Emergency Department (HOSPITAL_BASED_OUTPATIENT_CLINIC_OR_DEPARTMENT_OTHER): Payer: 59

## 2018-11-04 ENCOUNTER — Inpatient Hospital Stay (HOSPITAL_BASED_OUTPATIENT_CLINIC_OR_DEPARTMENT_OTHER)
Admission: EM | Admit: 2018-11-04 | Discharge: 2018-11-11 | DRG: 268 | Disposition: A | Discharge status: Home Health | Payer: 59 | Attending: Vascular Surgery | Admitting: Vascular Surgery

## 2018-11-04 ENCOUNTER — Encounter (HOSPITAL_BASED_OUTPATIENT_CLINIC_OR_DEPARTMENT_OTHER): Payer: Self-pay | Admitting: Emergency Medicine

## 2018-11-04 ENCOUNTER — Other Ambulatory Visit: Payer: Self-pay

## 2018-11-04 DIAGNOSIS — D696 Thrombocytopenia, unspecified: Secondary | ICD-10-CM | POA: Diagnosis present

## 2018-11-04 DIAGNOSIS — Z4659 Encounter for fitting and adjustment of other gastrointestinal appliance and device: Secondary | ICD-10-CM

## 2018-11-04 DIAGNOSIS — J9811 Atelectasis: Secondary | ICD-10-CM | POA: Diagnosis present

## 2018-11-04 DIAGNOSIS — I462 Cardiac arrest due to underlying cardiac condition: Secondary | ICD-10-CM | POA: Diagnosis present

## 2018-11-04 DIAGNOSIS — R34 Anuria and oliguria: Secondary | ICD-10-CM | POA: Diagnosis not present

## 2018-11-04 DIAGNOSIS — Z6836 Body mass index (BMI) 36.0-36.9, adult: Secondary | ICD-10-CM

## 2018-11-04 DIAGNOSIS — I469 Cardiac arrest, cause unspecified: Secondary | ICD-10-CM

## 2018-11-04 DIAGNOSIS — Z452 Encounter for adjustment and management of vascular access device: Secondary | ICD-10-CM

## 2018-11-04 DIAGNOSIS — I110 Hypertensive heart disease with heart failure: Secondary | ICD-10-CM | POA: Diagnosis present

## 2018-11-04 DIAGNOSIS — Z96651 Presence of right artificial knee joint: Secondary | ICD-10-CM | POA: Diagnosis present

## 2018-11-04 DIAGNOSIS — E669 Obesity, unspecified: Secondary | ICD-10-CM | POA: Diagnosis present

## 2018-11-04 DIAGNOSIS — I429 Cardiomyopathy, unspecified: Secondary | ICD-10-CM

## 2018-11-04 DIAGNOSIS — Z955 Presence of coronary angioplasty implant and graft: Secondary | ICD-10-CM

## 2018-11-04 DIAGNOSIS — I2510 Atherosclerotic heart disease of native coronary artery without angina pectoris: Secondary | ICD-10-CM | POA: Diagnosis present

## 2018-11-04 DIAGNOSIS — I713 Abdominal aortic aneurysm, ruptured, unspecified: Secondary | ICD-10-CM | POA: Diagnosis present

## 2018-11-04 DIAGNOSIS — R748 Abnormal levels of other serum enzymes: Secondary | ICD-10-CM | POA: Diagnosis present

## 2018-11-04 DIAGNOSIS — J9690 Respiratory failure, unspecified, unspecified whether with hypoxia or hypercapnia: Secondary | ICD-10-CM

## 2018-11-04 DIAGNOSIS — R578 Other shock: Secondary | ICD-10-CM | POA: Diagnosis present

## 2018-11-04 DIAGNOSIS — Z7982 Long term (current) use of aspirin: Secondary | ICD-10-CM

## 2018-11-04 DIAGNOSIS — E872 Acidosis: Secondary | ICD-10-CM | POA: Diagnosis present

## 2018-11-04 DIAGNOSIS — D62 Acute posthemorrhagic anemia: Secondary | ICD-10-CM | POA: Diagnosis not present

## 2018-11-04 DIAGNOSIS — N179 Acute kidney failure, unspecified: Secondary | ICD-10-CM

## 2018-11-04 DIAGNOSIS — Z978 Presence of other specified devices: Secondary | ICD-10-CM

## 2018-11-04 DIAGNOSIS — K567 Ileus, unspecified: Secondary | ICD-10-CM | POA: Diagnosis not present

## 2018-11-04 DIAGNOSIS — I714 Abdominal aortic aneurysm, without rupture: Secondary | ICD-10-CM | POA: Diagnosis not present

## 2018-11-04 DIAGNOSIS — Z79899 Other long term (current) drug therapy: Secondary | ICD-10-CM

## 2018-11-04 DIAGNOSIS — Z8249 Family history of ischemic heart disease and other diseases of the circulatory system: Secondary | ICD-10-CM

## 2018-11-04 DIAGNOSIS — E785 Hyperlipidemia, unspecified: Secondary | ICD-10-CM | POA: Diagnosis present

## 2018-11-04 DIAGNOSIS — N170 Acute kidney failure with tubular necrosis: Secondary | ICD-10-CM | POA: Diagnosis present

## 2018-11-04 DIAGNOSIS — F1290 Cannabis use, unspecified, uncomplicated: Secondary | ICD-10-CM | POA: Diagnosis present

## 2018-11-04 DIAGNOSIS — J9601 Acute respiratory failure with hypoxia: Secondary | ICD-10-CM | POA: Diagnosis not present

## 2018-11-04 DIAGNOSIS — Z9911 Dependence on respirator [ventilator] status: Secondary | ICD-10-CM

## 2018-11-04 DIAGNOSIS — I5021 Acute systolic (congestive) heart failure: Secondary | ICD-10-CM | POA: Diagnosis present

## 2018-11-04 DIAGNOSIS — J811 Chronic pulmonary edema: Secondary | ICD-10-CM

## 2018-11-04 HISTORY — DX: Arterial fibromuscular dysplasia: I77.3

## 2018-11-04 HISTORY — DX: Other specified postprocedural states: Z98.890

## 2018-11-04 HISTORY — DX: Abdominal aortic aneurysm, ruptured, unspecified: I71.30

## 2018-11-04 HISTORY — DX: Abdominal aortic aneurysm, ruptured: I71.3

## 2018-11-04 MED ORDER — SODIUM CHLORIDE 0.9% IV SOLUTION
Freq: Once | INTRAVENOUS | Status: AC
  Administered 2018-11-05: via INTRAVENOUS
  Filled 2018-11-04: qty 250

## 2018-11-04 MED ORDER — SODIUM CHLORIDE 0.9 % IV BOLUS
1000.0000 mL | Freq: Once | INTRAVENOUS | Status: AC
  Administered 2018-11-04: 1000 mL via INTRAVENOUS

## 2018-11-04 MED ORDER — IOPAMIDOL (ISOVUE-370) INJECTION 76%
100.0000 mL | Freq: Once | INTRAVENOUS | Status: AC | PRN
  Administered 2018-11-04: 100 mL via INTRAVENOUS

## 2018-11-04 NOTE — ED Notes (Signed)
[facility] notified (STAT) consult with vascular surgeon and to send transport immediately  (Tammy)

## 2018-11-04 NOTE — ED Triage Notes (Signed)
Pt c/o lower abd pain x several hours with nausea. Denies vomiting or diarrhea.

## 2018-11-05 ENCOUNTER — Emergency Department (HOSPITAL_COMMUNITY): Payer: 59 | Admitting: Certified Registered"

## 2018-11-05 ENCOUNTER — Encounter (HOSPITAL_COMMUNITY): Payer: Self-pay | Admitting: Emergency Medicine

## 2018-11-05 ENCOUNTER — Inpatient Hospital Stay (HOSPITAL_COMMUNITY): Payer: 59

## 2018-11-05 ENCOUNTER — Encounter (HOSPITAL_COMMUNITY)
Admission: EM | Disposition: A | Discharge status: Home Health | Payer: Self-pay | Source: Home / Self Care | Attending: Vascular Surgery

## 2018-11-05 ENCOUNTER — Other Ambulatory Visit: Payer: Self-pay | Admitting: Critical Care Medicine

## 2018-11-05 DIAGNOSIS — Z955 Presence of coronary angioplasty implant and graft: Secondary | ICD-10-CM | POA: Diagnosis not present

## 2018-11-05 DIAGNOSIS — I469 Cardiac arrest, cause unspecified: Secondary | ICD-10-CM | POA: Diagnosis not present

## 2018-11-05 DIAGNOSIS — Z9911 Dependence on respirator [ventilator] status: Secondary | ICD-10-CM | POA: Diagnosis not present

## 2018-11-05 DIAGNOSIS — R34 Anuria and oliguria: Secondary | ICD-10-CM | POA: Diagnosis not present

## 2018-11-05 DIAGNOSIS — I462 Cardiac arrest due to underlying cardiac condition: Secondary | ICD-10-CM | POA: Diagnosis not present

## 2018-11-05 DIAGNOSIS — D696 Thrombocytopenia, unspecified: Secondary | ICD-10-CM | POA: Diagnosis not present

## 2018-11-05 DIAGNOSIS — J9601 Acute respiratory failure with hypoxia: Secondary | ICD-10-CM | POA: Diagnosis not present

## 2018-11-05 DIAGNOSIS — I713 Abdominal aortic aneurysm, ruptured, unspecified: Secondary | ICD-10-CM | POA: Diagnosis present

## 2018-11-05 DIAGNOSIS — Z7982 Long term (current) use of aspirin: Secondary | ICD-10-CM | POA: Diagnosis not present

## 2018-11-05 DIAGNOSIS — N179 Acute kidney failure, unspecified: Secondary | ICD-10-CM | POA: Diagnosis not present

## 2018-11-05 DIAGNOSIS — E872 Acidosis: Secondary | ICD-10-CM | POA: Diagnosis not present

## 2018-11-05 DIAGNOSIS — R748 Abnormal levels of other serum enzymes: Secondary | ICD-10-CM | POA: Diagnosis not present

## 2018-11-05 DIAGNOSIS — R578 Other shock: Secondary | ICD-10-CM | POA: Diagnosis not present

## 2018-11-05 DIAGNOSIS — J9690 Respiratory failure, unspecified, unspecified whether with hypoxia or hypercapnia: Secondary | ICD-10-CM | POA: Diagnosis not present

## 2018-11-05 DIAGNOSIS — I5043 Acute on chronic combined systolic (congestive) and diastolic (congestive) heart failure: Secondary | ICD-10-CM | POA: Diagnosis not present

## 2018-11-05 DIAGNOSIS — N170 Acute kidney failure with tubular necrosis: Secondary | ICD-10-CM | POA: Diagnosis not present

## 2018-11-05 DIAGNOSIS — I319 Disease of pericardium, unspecified: Secondary | ICD-10-CM

## 2018-11-05 DIAGNOSIS — R918 Other nonspecific abnormal finding of lung field: Secondary | ICD-10-CM | POA: Diagnosis not present

## 2018-11-05 DIAGNOSIS — Z4682 Encounter for fitting and adjustment of non-vascular catheter: Secondary | ICD-10-CM | POA: Diagnosis not present

## 2018-11-05 DIAGNOSIS — F1290 Cannabis use, unspecified, uncomplicated: Secondary | ICD-10-CM | POA: Diagnosis not present

## 2018-11-05 DIAGNOSIS — I5021 Acute systolic (congestive) heart failure: Secondary | ICD-10-CM | POA: Diagnosis not present

## 2018-11-05 DIAGNOSIS — I10 Essential (primary) hypertension: Secondary | ICD-10-CM | POA: Diagnosis not present

## 2018-11-05 DIAGNOSIS — E785 Hyperlipidemia, unspecified: Secondary | ICD-10-CM | POA: Diagnosis not present

## 2018-11-05 DIAGNOSIS — I2101 ST elevation (STEMI) myocardial infarction involving left main coronary artery: Secondary | ICD-10-CM

## 2018-11-05 DIAGNOSIS — I2510 Atherosclerotic heart disease of native coronary artery without angina pectoris: Secondary | ICD-10-CM | POA: Diagnosis not present

## 2018-11-05 DIAGNOSIS — I429 Cardiomyopathy, unspecified: Secondary | ICD-10-CM | POA: Diagnosis not present

## 2018-11-05 DIAGNOSIS — Z96651 Presence of right artificial knee joint: Secondary | ICD-10-CM | POA: Diagnosis not present

## 2018-11-05 DIAGNOSIS — Z79899 Other long term (current) drug therapy: Secondary | ICD-10-CM | POA: Diagnosis not present

## 2018-11-05 DIAGNOSIS — E669 Obesity, unspecified: Secondary | ICD-10-CM | POA: Diagnosis not present

## 2018-11-05 DIAGNOSIS — K567 Ileus, unspecified: Secondary | ICD-10-CM | POA: Diagnosis not present

## 2018-11-05 DIAGNOSIS — R000 Tachycardia, unspecified: Secondary | ICD-10-CM | POA: Diagnosis not present

## 2018-11-05 DIAGNOSIS — J811 Chronic pulmonary edema: Secondary | ICD-10-CM | POA: Diagnosis not present

## 2018-11-05 DIAGNOSIS — D62 Acute posthemorrhagic anemia: Secondary | ICD-10-CM | POA: Diagnosis not present

## 2018-11-05 DIAGNOSIS — R579 Shock, unspecified: Secondary | ICD-10-CM | POA: Diagnosis not present

## 2018-11-05 DIAGNOSIS — J9811 Atelectasis: Secondary | ICD-10-CM | POA: Diagnosis not present

## 2018-11-05 DIAGNOSIS — I110 Hypertensive heart disease with heart failure: Secondary | ICD-10-CM | POA: Diagnosis not present

## 2018-11-05 HISTORY — PX: ABDOMINAL AORTIC ANEURYSM REPAIR: SHX42

## 2018-11-05 LAB — POCT I-STAT 7, (LYTES, BLD GAS, ICA,H+H)
Acid-Base Excess: 2 mmol/L (ref 0.0–2.0)
Acid-base deficit: 9 mmol/L — ABNORMAL HIGH (ref 0.0–2.0)
Acid-base deficit: 2 mmol/L (ref 0.0–2.0)
Acid-base deficit: 4 mmol/L — ABNORMAL HIGH (ref 0.0–2.0)
Acid-base deficit: 3 mmol/L — ABNORMAL HIGH (ref 0.0–2.0)
Acid-base deficit: 9 mmol/L — ABNORMAL HIGH (ref 0.0–2.0)
BICARBONATE: 18.1 mmol/L — AB (ref 20.0–28.0)
BICARBONATE: 21.4 mmol/L (ref 20.0–28.0)
Bicarbonate: 25.7 mmol/L (ref 20.0–28.0)
Bicarbonate: 24.1 mmol/L (ref 20.0–28.0)
Bicarbonate: 24.3 mmol/L (ref 20.0–28.0)
Bicarbonate: 27 mmol/L (ref 20.0–28.0)
CALCIUM ION: 1.16 mmol/L (ref 1.15–1.40)
Calcium, Ion: 1.09 mmol/L — ABNORMAL LOW (ref 1.15–1.40)
Calcium, Ion: 0.9 mmol/L — ABNORMAL LOW (ref 1.15–1.40)
Calcium, Ion: 0.89 mmol/L — CL (ref 1.15–1.40)
Calcium, Ion: 1.2 mmol/L (ref 1.15–1.40)
Calcium, Ion: 1.18 mmol/L (ref 1.15–1.40)
HCT: 34 % — ABNORMAL LOW (ref 39.0–52.0)
HCT: 32 % — ABNORMAL LOW (ref 39.0–52.0)
HCT: 24 % — ABNORMAL LOW (ref 39.0–52.0)
HCT: 42 % (ref 39.0–52.0)
HCT: 42 % (ref 39.0–52.0)
HCT: 42 % (ref 39.0–52.0)
HEMOGLOBIN: 14.3 g/dL (ref 13.0–17.0)
Hemoglobin: 11.6 g/dL — ABNORMAL LOW (ref 13.0–17.0)
Hemoglobin: 10.9 g/dL — ABNORMAL LOW (ref 13.0–17.0)
Hemoglobin: 8.2 g/dL — ABNORMAL LOW (ref 13.0–17.0)
Hemoglobin: 14.3 g/dL (ref 13.0–17.0)
Hemoglobin: 14.3 g/dL (ref 13.0–17.0)
O2 SAT: 100 %
O2 Saturation: 99 %
O2 Saturation: 100 %
O2 Saturation: 96 %
O2 Saturation: 99 %
O2 Saturation: 100 %
PCO2 ART: 85.7 mmHg — AB (ref 32.0–48.0)
Patient temperature: 34.9
Patient temperature: 34.3
Patient temperature: 34.4
Patient temperature: 34.6
Potassium: 4.1 mmol/L (ref 3.5–5.1)
Potassium: 4.6 mmol/L (ref 3.5–5.1)
Potassium: 5 mmol/L (ref 3.5–5.1)
Potassium: 5.5 mmol/L — ABNORMAL HIGH (ref 3.5–5.1)
Potassium: 6.3 mmol/L (ref 3.5–5.1)
Potassium: 5.2 mmol/L — ABNORMAL HIGH (ref 3.5–5.1)
SODIUM: 143 mmol/L (ref 135–145)
Sodium: 139 mmol/L (ref 135–145)
Sodium: 141 mmol/L (ref 135–145)
Sodium: 144 mmol/L (ref 135–145)
Sodium: 145 mmol/L (ref 135–145)
Sodium: 143 mmol/L (ref 135–145)
TCO2: 19 mmol/L — ABNORMAL LOW (ref 22–32)
TCO2: 27 mmol/L (ref 22–32)
TCO2: 23 mmol/L (ref 22–32)
TCO2: 26 mmol/L (ref 22–32)
TCO2: 27 mmol/L (ref 22–32)
TCO2: 28 mmol/L (ref 22–32)
pCO2 arterial: 41.5 mmHg (ref 32.0–48.0)
pCO2 arterial: 51.2 mmHg — ABNORMAL HIGH (ref 32.0–48.0)
pCO2 arterial: 34.1 mmHg (ref 32.0–48.0)
pCO2 arterial: 45.3 mmHg (ref 32.0–48.0)
pCO2 arterial: 40.2 mmHg (ref 32.0–48.0)
pH, Arterial: 7.248 — ABNORMAL LOW (ref 7.350–7.450)
pH, Arterial: 7.297 — ABNORMAL LOW (ref 7.350–7.450)
pH, Arterial: 7.396 (ref 7.350–7.450)
pH, Arterial: 7.038 — CL (ref 7.350–7.450)
pH, Arterial: 7.324 — ABNORMAL LOW (ref 7.350–7.450)
pH, Arterial: 7.424 (ref 7.350–7.450)
pO2, Arterial: 144 mmHg — ABNORMAL HIGH (ref 83.0–108.0)
pO2, Arterial: 321 mmHg — ABNORMAL HIGH (ref 83.0–108.0)
pO2, Arterial: 204 mmHg — ABNORMAL HIGH (ref 83.0–108.0)
pO2, Arterial: 112 mmHg — ABNORMAL HIGH (ref 83.0–108.0)
pO2, Arterial: 136 mmHg — ABNORMAL HIGH (ref 83.0–108.0)
pO2, Arterial: 303 mmHg — ABNORMAL HIGH (ref 83.0–108.0)

## 2018-11-05 LAB — DIC (DISSEMINATED INTRAVASCULAR COAGULATION)PANEL
D-Dimer, Quant: >20 ug/mL-FEU — ABNORMAL HIGH (ref 0.00–0.50)
Fibrinogen: 158 mg/dL — ABNORMAL LOW (ref 210–475)
Fibrinogen: 191 mg/dL — ABNORMAL LOW (ref 210–475)
INR: 1.57
INR: 1.48
Platelets: 96 10*3/uL — ABNORMAL LOW (ref 150–400)
Platelets: 128 10*3/uL — ABNORMAL LOW (ref 150–400)
Prothrombin Time: 18.6 seconds — ABNORMAL HIGH (ref 11.4–15.2)
Prothrombin Time: 17.8 seconds — ABNORMAL HIGH (ref 11.4–15.2)
Smear Review: NONE SEEN
Smear Review: NONE SEEN
aPTT: 37 seconds — ABNORMAL HIGH (ref 24–36)
aPTT: >200 seconds (ref 24–36)

## 2018-11-05 LAB — BASIC METABOLIC PANEL
Anion gap: 11 (ref 5–15)
Anion gap: 11 (ref 5–15)
Anion gap: 11 (ref 5–15)
BUN: 14 mg/dL (ref 8–23)
BUN: 16 mg/dL (ref 8–23)
BUN: 21 mg/dL (ref 8–23)
CALCIUM: 8.3 mg/dL — AB (ref 8.9–10.3)
CO2: 21 mmol/L — ABNORMAL LOW (ref 22–32)
CO2: 22 mmol/L (ref 22–32)
CO2: 22 mmol/L (ref 22–32)
CREATININE: 1.54 mg/dL — AB (ref 0.61–1.24)
Calcium: 7.8 mg/dL — ABNORMAL LOW (ref 8.9–10.3)
Calcium: 8.5 mg/dL — ABNORMAL LOW (ref 8.9–10.3)
Chloride: 106 mmol/L (ref 98–111)
Chloride: 111 mmol/L (ref 98–111)
Chloride: 112 mmol/L — ABNORMAL HIGH (ref 98–111)
Creatinine, Ser: 1.38 mg/dL — ABNORMAL HIGH (ref 0.61–1.24)
Creatinine, Ser: 2.32 mg/dL — ABNORMAL HIGH (ref 0.61–1.24)
GFR calc Af Amer: 34 mL/min — ABNORMAL LOW (ref >60)
GFR calc Af Amer: 56 mL/min — ABNORMAL LOW (ref >60)
GFR calc Af Amer: >60 mL/min (ref >60)
GFR calc non Af Amer: 29 mL/min — ABNORMAL LOW (ref >60)
GFR calc non Af Amer: 48 mL/min — ABNORMAL LOW (ref >60)
GFR calc non Af Amer: 55 mL/min — ABNORMAL LOW (ref >60)
Glucose, Bld: 123 mg/dL — ABNORMAL HIGH (ref 70–99)
Glucose, Bld: 143 mg/dL — ABNORMAL HIGH (ref 70–99)
Glucose, Bld: 146 mg/dL — ABNORMAL HIGH (ref 70–99)
POTASSIUM: 4.7 mmol/L (ref 3.5–5.1)
POTASSIUM: 5.7 mmol/L — AB (ref 3.5–5.1)
Potassium: 5.2 mmol/L — ABNORMAL HIGH (ref 3.5–5.1)
Sodium: 138 mmol/L (ref 135–145)
Sodium: 144 mmol/L (ref 135–145)
Sodium: 145 mmol/L (ref 135–145)

## 2018-11-05 LAB — CBC WITH DIFFERENTIAL/PLATELET
Abs Immature Granulocytes: 0.03 10*3/uL (ref 0.00–0.07)
Abs Immature Granulocytes: 0.61 10*3/uL — ABNORMAL HIGH (ref 0.00–0.07)
Basophils Absolute: 0 10*3/uL (ref 0.0–0.1)
Basophils Absolute: 0.1 10*3/uL (ref 0.0–0.1)
Basophils Relative: 0 %
Basophils Relative: 1 %
Eosinophils Absolute: 0 10*3/uL (ref 0.0–0.5)
Eosinophils Absolute: 0 10*3/uL (ref 0.0–0.5)
Eosinophils Relative: 0 %
Eosinophils Relative: 0 %
HCT: 47.1 % (ref 39.0–52.0)
HEMATOCRIT: 48.7 % (ref 39.0–52.0)
Hemoglobin: 15.5 g/dL (ref 13.0–17.0)
Hemoglobin: 16.1 g/dL (ref 13.0–17.0)
IMMATURE GRANULOCYTES: 0 %
Immature Granulocytes: 5 %
Lymphocytes Relative: 11 %
Lymphocytes Relative: 33 %
Lymphs Abs: 1.3 10*3/uL (ref 0.7–4.0)
Lymphs Abs: 2.9 10*3/uL (ref 0.7–4.0)
MCH: 29.1 pg (ref 26.0–34.0)
MCH: 31.2 pg (ref 26.0–34.0)
MCHC: 31.8 g/dL (ref 30.0–36.0)
MCHC: 34.2 g/dL (ref 30.0–36.0)
MCV: 85.2 fL (ref 80.0–100.0)
MCV: 98 fL (ref 80.0–100.0)
MONOS PCT: 4 %
Monocytes Absolute: 0.3 10*3/uL (ref 0.1–1.0)
Monocytes Absolute: 1.1 10*3/uL — ABNORMAL HIGH (ref 0.1–1.0)
Monocytes Relative: 9 %
Neutro Abs: 5.5 10*3/uL (ref 1.7–7.7)
Neutro Abs: 9.1 10*3/uL — ABNORMAL HIGH (ref 1.7–7.7)
Neutrophils Relative %: 63 %
Neutrophils Relative %: 74 %
Platelets: 102 10*3/uL — ABNORMAL LOW (ref 150–400)
Platelets: ADEQUATE 10*3/uL (ref 150–400)
RBC: 4.97 MIL/uL (ref 4.22–5.81)
RBC: 5.53 MIL/uL (ref 4.22–5.81)
RDW: 13.3 % (ref 11.5–15.5)
RDW: 16.2 % — ABNORMAL HIGH (ref 11.5–15.5)
Smear Review: UNDETERMINED
WBC: 12.2 10*3/uL — ABNORMAL HIGH (ref 4.0–10.5)
WBC: 8.7 10*3/uL (ref 4.0–10.5)
nRBC: 0 % (ref 0.0–0.2)
nRBC: 0.5 % — ABNORMAL HIGH (ref 0.0–0.2)

## 2018-11-05 LAB — CBC
HCT: 46.4 % (ref 39.0–52.0)
Hemoglobin: 16.3 g/dL (ref 13.0–17.0)
MCH: 30.2 pg (ref 26.0–34.0)
MCHC: 35.1 g/dL (ref 30.0–36.0)
MCV: 86.1 fL (ref 80.0–100.0)
Platelets: 95 10*3/uL — ABNORMAL LOW (ref 150–400)
RBC: 5.39 MIL/uL (ref 4.22–5.81)
RDW: 16.1 % — ABNORMAL HIGH (ref 11.5–15.5)
WBC: 12.9 10*3/uL — ABNORMAL HIGH (ref 4.0–10.5)
nRBC: 0.2 % (ref 0.0–0.2)

## 2018-11-05 LAB — PROTIME-INR
INR: 1.43
Prothrombin Time: 17.2 seconds — ABNORMAL HIGH (ref 11.4–15.2)

## 2018-11-05 LAB — TROPONIN I
TROPONIN I: 5.23 ng/mL — AB (ref <0.03)
Troponin I: 0.04 ng/mL (ref <0.03)
Troponin I: 2.02 ng/mL (ref <0.03)
Troponin I: 5.71 ng/mL (ref <0.03)

## 2018-11-05 LAB — COMPREHENSIVE METABOLIC PANEL
ALT: 25 U/L (ref 0–44)
AST: 61 U/L — ABNORMAL HIGH (ref 15–41)
Albumin: 2.5 g/dL — ABNORMAL LOW (ref 3.5–5.0)
Alkaline Phosphatase: 38 U/L (ref 38–126)
Anion gap: 11 (ref 5–15)
BUN: 18 mg/dL (ref 8–23)
CO2: 21 mmol/L — ABNORMAL LOW (ref 22–32)
CREATININE: 1.76 mg/dL — AB (ref 0.61–1.24)
Calcium: 8.1 mg/dL — ABNORMAL LOW (ref 8.9–10.3)
Chloride: 112 mmol/L — ABNORMAL HIGH (ref 98–111)
GFR calc non Af Amer: 41 mL/min — ABNORMAL LOW (ref >60)
GFR, EST AFRICAN AMERICAN: 47 mL/min — AB (ref >60)
Glucose, Bld: 160 mg/dL — ABNORMAL HIGH (ref 70–99)
Potassium: 5.1 mmol/L (ref 3.5–5.1)
Sodium: 144 mmol/L (ref 135–145)
Total Bilirubin: 2 mg/dL — ABNORMAL HIGH (ref 0.3–1.2)
Total Protein: 4.2 g/dL — ABNORMAL LOW (ref 6.5–8.1)

## 2018-11-05 LAB — ABO/RH: ABO/RH(D): O POS

## 2018-11-05 LAB — HEMOGLOBIN AND HEMATOCRIT, BLOOD
HCT: 47 % (ref 39.0–52.0)
Hemoglobin: 16.1 g/dL (ref 13.0–17.0)

## 2018-11-05 LAB — DIC (DISSEMINATED INTRAVASCULAR COAGULATION) PANEL

## 2018-11-05 LAB — ECHOCARDIOGRAM COMPLETE
Height: 76 in
Weight: 4800 oz

## 2018-11-05 LAB — LACTIC ACID, PLASMA
Lactic Acid, Venous: 4.2 mmol/L (ref 0.5–1.9)
Lactic Acid, Venous: 5.5 mmol/L (ref 0.5–1.9)
Lactic Acid, Venous: 4 mmol/L (ref 0.5–1.9)
Lactic Acid, Venous: 3.9 mmol/L (ref 0.5–1.9)
Lactic Acid, Venous: 4.1 mmol/L (ref 0.5–1.9)

## 2018-11-05 LAB — APTT: aPTT: 52 seconds — ABNORMAL HIGH (ref 24–36)

## 2018-11-05 LAB — FIBRINOGEN: Fibrinogen: 200 mg/dL — ABNORMAL LOW (ref 210–475)

## 2018-11-05 LAB — AMMONIA: Ammonia: 39 umol/L — ABNORMAL HIGH (ref 9–35)

## 2018-11-05 LAB — TSH: TSH: 1.585 u[IU]/mL (ref 0.350–4.500)

## 2018-11-05 LAB — MAGNESIUM: Magnesium: 1.6 mg/dL — ABNORMAL LOW (ref 1.7–2.4)

## 2018-11-05 LAB — PREPARE RBC (CROSSMATCH)

## 2018-11-05 SURGERY — ANEURYSM ABDOMINAL AORTIC REPAIR
Anesthesia: General

## 2018-11-05 MED ORDER — HEPARIN SODIUM (PORCINE) 1000 UNIT/ML IJ SOLN
INTRAMUSCULAR | Status: DC | PRN
  Administered 2018-11-05: 10000 [IU] via INTRAVENOUS

## 2018-11-05 MED ORDER — SODIUM CHLORIDE 0.9 % IV SOLN
INTRAVENOUS | Status: DC | PRN
  Administered 2018-11-05 (×4): via INTRAVENOUS

## 2018-11-05 MED ORDER — SUCCINYLCHOLINE CHLORIDE 20 MG/ML IJ SOLN
INTRAMUSCULAR | Status: DC | PRN
  Administered 2018-11-05: 100 mg via INTRAVENOUS

## 2018-11-05 MED ORDER — SODIUM CHLORIDE 0.9 % IV SOLN
INTRAVENOUS | Status: DC | PRN
  Administered 2018-11-05: 500 mL

## 2018-11-05 MED ORDER — CHLORHEXIDINE GLUCONATE 0.12% ORAL RINSE (MEDLINE KIT)
15.0000 mL | Freq: Two times a day (BID) | OROMUCOSAL | Status: DC
  Administered 2018-11-05 – 2018-11-07 (×5): 15 mL via OROMUCOSAL

## 2018-11-05 MED ORDER — ACETAMINOPHEN 325 MG RE SUPP: 325.0000 mg | RECTAL | Status: DC | PRN

## 2018-11-05 MED ORDER — HEMOSTATIC AGENTS (NO CHARGE) OPTIME
TOPICAL | Status: DC | PRN
  Administered 2018-11-05 (×2): 1 via TOPICAL

## 2018-11-05 MED ORDER — ACETAMINOPHEN 325 MG PO TABS
325.0000 mg | ORAL_TABLET | ORAL | Status: DC | PRN
  Filled 2018-11-05: qty 2

## 2018-11-05 MED ORDER — SODIUM CHLORIDE 0.9 % IV SOLN
INTRAVENOUS | Status: DC | PRN
  Administered 2018-11-05: 50 ug/min via INTRAVENOUS
  Administered 2018-11-05: 25 ug/min via INTRAVENOUS

## 2018-11-05 MED ORDER — ONDANSETRON HCL 4 MG/2ML IJ SOLN: 4.0000 mg | Freq: Four times a day (QID) | INTRAMUSCULAR | Status: DC | PRN

## 2018-11-05 MED ORDER — LACTATED RINGERS IV SOLN
INTRAVENOUS | Status: DC | PRN
  Administered 2018-11-05 (×3): via INTRAVENOUS

## 2018-11-05 MED ORDER — MORPHINE SULFATE (PF) 2 MG/ML IV SOLN: 2.0000 mg | INTRAVENOUS | Status: DC | PRN

## 2018-11-05 MED ORDER — SODIUM CHLORIDE 0.9% FLUSH: 10.0000 mL | INTRAVENOUS | Status: DC | PRN

## 2018-11-05 MED ORDER — FUROSEMIDE 10 MG/ML IJ SOLN
INTRAMUSCULAR | Status: AC
  Filled 2018-11-05: qty 4

## 2018-11-05 MED ORDER — NOREPINEPHRINE 4 MG/250ML-% IV SOLN
0.0000 ug/min | INTRAVENOUS | Status: DC
  Filled 2018-11-05: qty 250

## 2018-11-05 MED ORDER — DEXMEDETOMIDINE HCL IN NACL 200 MCG/50ML IV SOLN
0.4000 ug/kg/h | INTRAVENOUS | Status: DC
  Administered 2018-11-05: 0.9 ug/kg/h via INTRAVENOUS
  Administered 2018-11-05 (×2): 0.8 ug/kg/h via INTRAVENOUS
  Administered 2018-11-05 (×3): 0.9 ug/kg/h via INTRAVENOUS
  Filled 2018-11-05 (×7): qty 50

## 2018-11-05 MED ORDER — CALCIUM CHLORIDE 10 % IV SOLN
INTRAVENOUS | Status: DC | PRN
  Administered 2018-11-05 (×6): 500 mg via INTRAVENOUS

## 2018-11-05 MED ORDER — DEXMEDETOMIDINE HCL IN NACL 400 MCG/100ML IV SOLN
INTRAVENOUS | Status: DC | PRN
  Administered 2018-11-05: .4 ug/kg/h via INTRAVENOUS

## 2018-11-05 MED ORDER — PANTOPRAZOLE SODIUM 40 MG PO TBEC: 40.0000 mg | DELAYED_RELEASE_TABLET | Freq: Every day | ORAL | Status: DC

## 2018-11-05 MED ORDER — SODIUM CHLORIDE 0.9 % IV SOLN: 500.0000 mL | Freq: Once | INTRAVENOUS | Status: DC | PRN

## 2018-11-05 MED ORDER — SODIUM CHLORIDE 0.9% FLUSH
10.0000 mL | Freq: Two times a day (BID) | INTRAVENOUS | Status: DC
  Administered 2018-11-05 – 2018-11-07 (×4): 10 mL
  Administered 2018-11-08: 30 mL
  Administered 2018-11-08 – 2018-11-11 (×6): 10 mL

## 2018-11-05 MED ORDER — BISACODYL 10 MG RE SUPP: 10.0000 mg | Freq: Every day | RECTAL | Status: DC | PRN

## 2018-11-05 MED ORDER — CEFAZOLIN SODIUM-DEXTROSE 2-3 GM-%(50ML) IV SOLR
INTRAVENOUS | Status: DC | PRN
  Administered 2018-11-05 (×2): 2 g via INTRAVENOUS

## 2018-11-05 MED ORDER — FUROSEMIDE 10 MG/ML IJ SOLN
INTRAMUSCULAR | Status: DC | PRN
  Administered 2018-11-05: 40 mg via INTRAMUSCULAR

## 2018-11-05 MED ORDER — SODIUM CHLORIDE 0.9 % IV SOLN
0.4000 ug/kg/h | INTRAVENOUS | Status: DC
  Filled 2018-11-05: qty 4

## 2018-11-05 MED ORDER — HYDRALAZINE HCL 20 MG/ML IJ SOLN
5.0000 mg | INTRAMUSCULAR | Status: AC | PRN
  Administered 2018-11-07 (×2): 5 mg via INTRAVENOUS
  Filled 2018-11-05 (×2): qty 1

## 2018-11-05 MED ORDER — IODIXANOL 320 MG/ML IV SOLN
INTRAVENOUS | Status: DC | PRN
  Administered 2018-11-05: 150 mL via INTRAVENOUS

## 2018-11-05 MED ORDER — LABETALOL HCL 5 MG/ML IV SOLN
10.0000 mg | INTRAVENOUS | Status: AC | PRN
  Administered 2018-11-05 – 2018-11-06 (×4): 10 mg via INTRAVENOUS
  Filled 2018-11-05 (×4): qty 4

## 2018-11-05 MED ORDER — SUFENTANIL CITRATE 50 MCG/ML IV SOLN
INTRAVENOUS | Status: DC | PRN
  Administered 2018-11-05 (×5): 10 ug via INTRAVENOUS

## 2018-11-05 MED ORDER — METOPROLOL TARTRATE 5 MG/5ML IV SOLN
2.0000 mg | INTRAVENOUS | Status: AC | PRN
  Administered 2018-11-07 (×2): 5 mg via INTRAVENOUS
  Filled 2018-11-05 (×2): qty 5

## 2018-11-05 MED ORDER — POTASSIUM CHLORIDE CRYS ER 20 MEQ PO TBCR: 20.0000 meq | EXTENDED_RELEASE_TABLET | Freq: Once | ORAL | Status: DC | PRN

## 2018-11-05 MED ORDER — HEPARIN SODIUM (PORCINE) 1000 UNIT/ML IJ SOLN
INTRAMUSCULAR | Status: AC
  Filled 2018-11-05: qty 1

## 2018-11-05 MED ORDER — LIDOCAINE HCL (CARDIAC) PF 100 MG/5ML IV SOSY
PREFILLED_SYRINGE | INTRAVENOUS | Status: DC | PRN
  Administered 2018-11-05: 60 mg via INTRATRACHEAL

## 2018-11-05 MED ORDER — DEXMEDETOMIDINE HCL IN NACL 200 MCG/50ML IV SOLN
0.4000 ug/kg/h | INTRAVENOUS | Status: DC
  Administered 2018-11-05: 0.6 ug/kg/h via INTRAVENOUS
  Filled 2018-11-05: qty 50

## 2018-11-05 MED ORDER — PHENOL 1.4 % MT LIQD: 1.0000 | OROMUCOSAL | Status: DC | PRN

## 2018-11-05 MED ORDER — FUROSEMIDE 10 MG/ML IJ SOLN
80.0000 mg | Freq: Once | INTRAMUSCULAR | Status: AC
  Administered 2018-11-05: 80 mg via INTRAVENOUS
  Filled 2018-11-05: qty 8

## 2018-11-05 MED ORDER — SODIUM BICARBONATE 8.4 % IV SOLN
INTRAVENOUS | Status: DC | PRN
  Administered 2018-11-05: 100 meq via INTRAVENOUS
  Administered 2018-11-05: 150 meq via INTRAVENOUS

## 2018-11-05 MED ORDER — CEFAZOLIN SODIUM-DEXTROSE 2-4 GM/100ML-% IV SOLN
2.0000 g | Freq: Three times a day (TID) | INTRAVENOUS | Status: AC
  Administered 2018-11-05 (×2): 2 g via INTRAVENOUS
  Filled 2018-11-05 (×4): qty 100

## 2018-11-05 MED ORDER — PANTOPRAZOLE SODIUM 40 MG IV SOLR
40.0000 mg | Freq: Every day | INTRAVENOUS | Status: DC
  Administered 2018-11-05 – 2018-11-10 (×6): 40 mg via INTRAVENOUS
  Filled 2018-11-05 (×6): qty 40

## 2018-11-05 MED ORDER — 0.9 % SODIUM CHLORIDE (POUR BTL) OPTIME
TOPICAL | Status: DC | PRN
  Administered 2018-11-05: 4000 mL

## 2018-11-05 MED ORDER — EPINEPHRINE PF 1 MG/ML IJ SOLN
0.5000 ug/min | INTRAVENOUS | Status: DC
  Filled 2018-11-05: qty 4

## 2018-11-05 MED ORDER — SODIUM CHLORIDE 0.9 % IV SOLN
INTRAVENOUS | Status: DC
  Administered 2018-11-05: 11:00:00 via INTRAVENOUS

## 2018-11-05 MED ORDER — SODIUM CHLORIDE 0.9 % IV SOLN
Freq: Once | INTRAVENOUS | Status: AC
  Administered 2018-11-05: 999 mL via INTRAVENOUS

## 2018-11-05 MED ORDER — SODIUM CHLORIDE 0.9% IV SOLUTION: Freq: Once | INTRAVENOUS | Status: AC

## 2018-11-05 MED ORDER — ASPIRIN 81 MG PO CHEW
81.0000 mg | CHEWABLE_TABLET | Freq: Every day | ORAL | Status: DC
  Administered 2018-11-08 – 2018-11-11 (×4): 81 mg via ORAL
  Filled 2018-11-05 (×4): qty 1

## 2018-11-05 MED ORDER — ATORVASTATIN CALCIUM 80 MG PO TABS
80.0000 mg | ORAL_TABLET | Freq: Every day | ORAL | Status: DC
  Administered 2018-11-08 – 2018-11-11 (×4): 80 mg via ORAL
  Filled 2018-11-05 (×4): qty 1

## 2018-11-05 MED ORDER — ORAL CARE MOUTH RINSE
15.0000 mL | OROMUCOSAL | Status: DC
  Administered 2018-11-05 – 2018-11-07 (×22): 15 mL via OROMUCOSAL

## 2018-11-05 MED ORDER — PHENYLEPHRINE HCL 10 MG/ML IJ SOLN
INTRAMUSCULAR | Status: DC | PRN
  Administered 2018-11-05: 80 ug via INTRAVENOUS

## 2018-11-05 MED ORDER — VASOPRESSIN 20 UNIT/ML IV SOLN
0.0300 [IU]/min | INTRAVENOUS | Status: DC
  Filled 2018-11-05: qty 2

## 2018-11-05 MED ORDER — SODIUM CHLORIDE 0.9% IV SOLUTION: Freq: Once | INTRAVENOUS | Status: DC

## 2018-11-05 MED ORDER — SODIUM BICARBONATE 8.4 % IV SOLN
INTRAVENOUS | Status: AC
  Filled 2018-11-05: qty 200

## 2018-11-05 MED ORDER — DEXMEDETOMIDINE HCL IN NACL 400 MCG/100ML IV SOLN
0.4000 ug/kg/h | INTRAVENOUS | Status: DC
  Administered 2018-11-05 – 2018-11-06 (×3): 0.9 ug/kg/h via INTRAVENOUS
  Administered 2018-11-06: 0.4 ug/kg/h via INTRAVENOUS
  Administered 2018-11-07: 0.2 ug/kg/h via INTRAVENOUS
  Administered 2018-11-07: 0.4 ug/kg/h via INTRAVENOUS
  Administered 2018-11-07: 0.5 ug/kg/h via INTRAVENOUS
  Filled 2018-11-05 (×8): qty 100

## 2018-11-05 MED ORDER — FENTANYL CITRATE (PF) 250 MCG/5ML IJ SOLN
INTRAMUSCULAR | Status: DC | PRN
  Administered 2018-11-05: 100 ug via INTRAVENOUS

## 2018-11-05 MED ORDER — MAGNESIUM SULFATE 2 GM/50ML IV SOLN
2.0000 g | Freq: Once | INTRAVENOUS | Status: AC | PRN
  Administered 2018-11-05: 2 g via INTRAVENOUS
  Filled 2018-11-05: qty 50

## 2018-11-05 MED ORDER — ROCURONIUM BROMIDE 100 MG/10ML IV SOLN
INTRAVENOUS | Status: DC | PRN
  Administered 2018-11-05 (×5): 50 mg via INTRAVENOUS

## 2018-11-05 MED ORDER — LACTATED RINGERS IV SOLN
INTRAVENOUS | Status: DC
  Administered 2018-11-05: 20:00:00 via INTRAVENOUS
  Administered 2018-11-05: 900 mL via INTRAVENOUS

## 2018-11-05 MED ORDER — CHLORHEXIDINE GLUCONATE CLOTH 2 % EX PADS
6.0000 | MEDICATED_PAD | Freq: Every day | CUTANEOUS | Status: DC
  Administered 2018-11-05 – 2018-11-10 (×4): 6 via TOPICAL

## 2018-11-05 MED ORDER — MIDAZOLAM HCL 5 MG/5ML IJ SOLN
INTRAMUSCULAR | Status: DC | PRN
  Administered 2018-11-05 (×3): 2 mg via INTRAVENOUS

## 2018-11-05 SURGICAL SUPPLY — 65 items
ADH SKN CLS APL DERMABOND .7 (GAUZE/BANDAGES/DRESSINGS) ×1
AGENT HMST SPONGE THK3/8 (HEMOSTASIS) ×2
ATTRACTOMAT 16X20 MAGNETIC DRP (DRAPES) ×2 IMPLANT
CANISTER SUCT 3000ML PPV (MISCELLANEOUS) ×2 IMPLANT
CANNULA VESSEL 3MM 2 BLNT TIP (CANNULA) ×2 IMPLANT
CATH BEACON 5 .035 65 KMP TIP (CATHETERS) ×1 IMPLANT
CLIP VESOCCLUDE MED 24/CT (CLIP) ×2 IMPLANT
CLIP VESOCCLUDE SM WIDE 24/CT (CLIP) ×2 IMPLANT
COVER WAND RF STERILE (DRAPES) ×2 IMPLANT
DERMABOND ADVANCED (GAUZE/BANDAGES/DRESSINGS) ×1
DERMABOND ADVANCED .7 DNX12 (GAUZE/BANDAGES/DRESSINGS) ×1 IMPLANT
DEVICE CLOSURE PERCLS PRGLD 6F (VASCULAR PRODUCTS) IMPLANT
DEVICE TORQUE H2O (MISCELLANEOUS) ×1 IMPLANT
DRSG COVADERM 4X14 (GAUZE/BANDAGES/DRESSINGS) ×1 IMPLANT
DRYSEAL FLEXSHEATH 12FR 33CM (SHEATH) ×1
ELECT BLADE 4.0 EZ CLEAN MEGAD (MISCELLANEOUS) ×2
ELECT BLADE 6.5 EXT (BLADE) ×1 IMPLANT
ELECT REM PT RETURN 9FT ADLT (ELECTROSURGICAL) ×2
ELECTRODE BLDE 4.0 EZ CLN MEGD (MISCELLANEOUS) IMPLANT
ELECTRODE REM PT RTRN 9FT ADLT (ELECTROSURGICAL) ×1 IMPLANT
FELT TEFLON 1X6 (MISCELLANEOUS) ×1 IMPLANT
GLIDEWIRE ST .035 (WIRE) ×1 IMPLANT
GLOVE BIO SURGEON STRL SZ7.5 (GLOVE) ×2 IMPLANT
GOWN STRL REUS W/ TWL LRG LVL3 (GOWN DISPOSABLE) ×3 IMPLANT
GOWN STRL REUS W/TWL LRG LVL3 (GOWN DISPOSABLE) ×6
GRAFT BALLN CATH 65CM (STENTS) IMPLANT
GRAFT HEMASHIELD 18X9MM (Vascular Products) ×1 IMPLANT
HEMOSTAT SPONGE AVITENE ULTRA (HEMOSTASIS) ×2 IMPLANT
INSERT FOGARTY 61MM (MISCELLANEOUS) ×2 IMPLANT
INSERT FOGARTY SM (MISCELLANEOUS) ×2 IMPLANT
KIT BASIN OR (CUSTOM PROCEDURE TRAY) ×2 IMPLANT
KIT TURNOVER KIT B (KITS) ×2 IMPLANT
LOOP VESSEL MINI RED (MISCELLANEOUS) IMPLANT
NS IRRIG 1000ML POUR BTL (IV SOLUTION) ×4 IMPLANT
PACK AORTA (CUSTOM PROCEDURE TRAY) ×2 IMPLANT
PAD ARMBOARD 7.5X6 YLW CONV (MISCELLANEOUS) ×4 IMPLANT
PERCLOSE PROGLIDE 6F (VASCULAR PRODUCTS) ×4
RETAINER VISCERA MED (MISCELLANEOUS) ×3 IMPLANT
SET MICROPUNCTURE 5F STIFF (MISCELLANEOUS) ×1 IMPLANT
SHEATH DRYSEAL FLEX 12FR 33CM (SHEATH) IMPLANT
SHEATH FAST CATH 10F 12CM (SHEATH) ×1 IMPLANT
SPONGE LAP 18X18 RF (DISPOSABLE) ×7 IMPLANT
SPONGE LAP 18X18 X RAY DECT (DISPOSABLE) IMPLANT
SPONGE SURGIFOAM ABS GEL 100 (HEMOSTASIS) IMPLANT
STAPLER VISISTAT 35W (STAPLE) ×2 IMPLANT
STENT GRAFT BALLN CATH 65CM (STENTS) ×1
STRIP PERIGUARD 6X8 (Vascular Products) ×1 IMPLANT
SUT PDS AB 1 TP1 54 (SUTURE) ×4 IMPLANT
SUT PROLENE 3 0 SH DA (SUTURE) ×7 IMPLANT
SUT PROLENE 3 0 SH1 36 (SUTURE) ×2 IMPLANT
SUT PROLENE 5 0 CC 1 (SUTURE) ×2 IMPLANT
SUT PROLENE 6 0 C 1 30 (SUTURE) ×1 IMPLANT
SUT SILK 2 0SH CR/8 30 (SUTURE) ×2 IMPLANT
SUT VIC AB 2-0 SH 27 (SUTURE)
SUT VIC AB 2-0 SH 27XBRD (SUTURE) IMPLANT
SUT VIC AB 3-0 SH 27 (SUTURE) ×6
SUT VIC AB 3-0 SH 27X BRD (SUTURE) ×3 IMPLANT
SUT VIC AB 4-0 PS2 27 (SUTURE) ×1 IMPLANT
TAPE UMBILICAL COTTON 1/8X30 (MISCELLANEOUS) ×2 IMPLANT
TOWEL GREEN STERILE (TOWEL DISPOSABLE) ×2 IMPLANT
TOWEL SURG RFD BLUE STRL DISP (DISPOSABLE) ×1 IMPLANT
TRAY FOLEY MTR SLVR 16FR STAT (SET/KITS/TRAYS/PACK) ×2 IMPLANT
WATER STERILE IRR 1000ML POUR (IV SOLUTION) ×4 IMPLANT
WIRE AMPLATZ SS-J .035X180CM (WIRE) ×1 IMPLANT
WIRE BENTSON .035X145CM (WIRE) ×1 IMPLANT

## 2018-11-05 NOTE — Transfer of Care (Signed)
Immediate Anesthesia Transfer of Care Note  Patient: Robert [name]  Procedure(s) Performed: ANEURYSM ABDOMINAL AORTIC REPAIR WITH HEMASHIELD GOLD VELOUR VASCULAR GRAFT (N/A )  Patient Location: ICU  Anesthesia Type:General  Level of Consciousness: Patient remains intubated per anesthesia plan  Airway & Oxygen Therapy: Patient remains intubated per anesthesia plan and Patient placed on Ventilator (see vital sign flow sheet for setting)  Post-op Assessment: Report given to RN and Post -op Vital signs reviewed and stable  Post vital signs: Reviewed and stable  Last Vitals:  Vitals Value Taken Time  BP 126/95 ABP   Temp    Pulse 88   Resp 20   SpO2 100     Last Pain:  Vitals:   11/05/18 0005  TempSrc: Tympanic  PainSc:      Joslin MD and this CRNA with [name] RN transported patient to ICU, report to Zanders DO and Myles RN at bedside, VSS, Sue RT at bedside, applied to SIMV 690Vt 20RR 9PEEP 100% Fio2, confirmed ventilation; bilateral breath sounds, full report given, currently on precedex infusion for sedation, no vasoactive agents.     Complications: No apparent anesthesia complications

## 2018-11-05 NOTE — ED Notes (Addendum)
Pt ambulated to bathroom. Pt was heard yelling and found in the floor. Pt was stating that his pain is suddenly worse and then became unresponsive. Pt had snoring respirations and CPR was started. Dr. Stark Jock present immediately.

## 2018-11-05 NOTE — ED Notes (Signed)
Gave report to Richarda Blade. Charge RN Zacarias Pontes ed

## 2018-11-05 NOTE — Consult Note (Addendum)
Cardiology Consultation:   Patient ID: Robert [name] MRN: [id number]; DOB: 1956/05/13  Admit date: 11/04/2018 Date of Consult: 11/05/2018  Primary Care Provider: Shirline Frees, MD Primary Cardiologist:  New Primary Electrophysiologist:  None     Patient Profile:   Robert [name] is a 63 y.o. male with a hx of AAA, HTN, HLP, fibromuscular dysplasia s/p bilateral renal artery angioplasty in 2006 who is being seen today for the evaluation of an abnormal ECG at the request of Dr. Oneida Alar.  History of Present Illness:   Robert [name] presented to the [facility] emergency room late last night with abdominal pain.  He has a known AAA with last measure at 4.8 cm.  He developed cardiac arrest in the ED and CPR was started.  He regained ROSC after several minutes of CPR.  CT confirmed ruptured AAA and he was transferred to [facility].   He underwent AAA repair with Dr. Oneida Alar this AM.  In the ICU, a follow up ECG was noted to show 2 mm STE in V2, 81mm STE in V3, TW inversions in 2, 3, aVF.  Patient is currently intubated and unable to provide a hx.  When asked if he is having chest pain, he shakes his head "no".     Prior Cardiac Studies:  Renal Artery Angioplasty 10/03/2004  FINDINGS:  1.  Very tortuous abdominal aorta with possible early aneurysm formation of      both the infrarenal abdominal aorta and both common iliac arteries.  2.  Single renal arteries bilaterally, both with fibromuscular dysplasia.      These were treated with balloon angioplasty with excellent result as      assessed by pressure gradient.   Cardiac Catheterization 05/19/2012 Coronary Angiographic Data:  Left Main:  Large caliber vessel, angiographically normal appearance  Left Anterior Descending (LAD):  Large caliber vessel extending to the apex, significant toruosity, with mild luminal irregularities  Circumflex (LCx):  Large caliber vessel with tortuosity and no significant stenoses.  1st obtuse marginal:   Angiographically normal  2nd obtuse marginal:  Small vessel, no significant obstruction  Right Coronary Artery: Large, dominant vessel with no significant angiographic stenosis. The ostium which was previously reported to have mild to moderate disease, appears to be of normal caliber and not stenotic.  posterior descending artery: no significant obstruction  posterior lateral branch:  No significant obstruction Impression: 1.  No significant obstructive CAD. 2.  Large coronary arteries with right dominant circulation. 3.  False positive stress test.   Past Medical History:  Diagnosis Date  . AAA (abdominal aortic aneurysm, ruptured) ([place])   . Fibromuscular dysplasia of renal artery (HCC)    s/p angioplasty to both renal arteries in 2006  . History of cardiac catheterization    LHC in 2013:  no sig CAD  . Hyperlipidemia   . Hypertension     Past Surgical History:  Procedure Laterality Date  . CARDIAC SURGERY  2006   stent placement  . HERNIA REPAIR    . INSERTION OF MESH  08/31/2012   Procedure: INSERTION OF MESH;  Surgeon: Imogene Burn. Georgette Dover, MD;  Location: [place];  Service: General;  Laterality: N/A;  . KNEE SURGERY  2009/2010   right  . KNEE SURGERY  2010   right knee partial replacement  . LEFT HEART CATHETERIZATION WITH CORONARY ANGIOGRAM N/A 05/19/2012   Procedure: LEFT HEART CATHETERIZATION WITH CORONARY ANGIOGRAM;  Surgeon: Pixie Casino, MD;  Location: [facility] CATH LAB;  Service:  Cardiovascular;  Laterality: N/A;  . UMBILICAL HERNIA REPAIR  08/31/2012   Procedure: HERNIA REPAIR UMBILICAL ADULT;  Surgeon: Imogene Burn. Georgette Dover, MD;  Location: [place];  Service: General;  Laterality: N/A;  umbilical hernia repair with mesh     Home Medications:  Prior to Admission medications   Medication Sig Start Date End Date Taking? Authorizing Provider  aspirin EC 325 MG tablet Take 325 mg by mouth daily.    [provider]    benazepril-hydrochlorthiazide (LOTENSIN HCT) 20-12.5 MG per tablet Take 2 tablets by mouth daily. Takes 2 pills at once    [provider]  NIFEdipine (ADALAT CC) 90 MG 24 hr tablet Take 90 mg by mouth daily.    [provider]  rosuvastatin (CRESTOR) 20 MG tablet Take 20 mg by mouth daily.    [provider]  sildenafil (VIAGRA) 100 MG tablet  10/13/16   [provider]    Inpatient Medications: Scheduled Meds: . sodium chloride   Intravenous Once  . [START ON 11/06/2018] pantoprazole  40 mg Oral Daily  . pantoprazole (PROTONIX) IV  40 mg Intravenous QHS   Continuous Infusions: . sodium chloride    . sodium chloride    .  ceFAZolin (ANCEF) IV    . dexmedetomidine (PRECEDEX) IV infusion    . epinephrine    . magnesium sulfate 1 - 4 g bolus IVPB    . norepinephrine (LEVOPHED) Adult infusion    . vasopressin (PITRESSIN) infusion - *FOR SHOCK*     PRN Meds: sodium chloride, acetaminophen **OR** acetaminophen, bisacodyl, hydrALAZINE, labetalol, magnesium sulfate 1 - 4 g bolus IVPB, metoprolol tartrate, morphine injection, ondansetron, phenol, potassium chloride  Allergies:   No Known Allergies  Social History:   Social History   Socioeconomic History  . Marital status: Married    Spouse name: Not on file  . Number of children: Not on file  . Years of education: Not on file  . Highest education level: Not on file  Occupational History  . Not on file  Social Needs  . Financial resource strain: Not on file  . Food insecurity:    Worry: Not on file    Inability: Not on file  . Transportation needs:    Medical: Not on file    Non-medical: Not on file  Tobacco Use  . Smoking status: Never Smoker  . Smokeless tobacco: Never Used  Substance and Sexual Activity  . Alcohol use: Yes  . Drug use: Yes    Types: Marijuana  . Sexual activity: Not on file  Lifestyle  . Physical activity:    Days per week: Not on file    Minutes per session: Not  on file  . Stress: Not on file  Relationships  . Social connections:    Talks on phone: Not on file    Gets together: Not on file    Attends religious service: Not on file    Active member of club or organization: Not on file    Attends meetings of clubs or organizations: Not on file    Relationship status: Not on file  . Intimate partner violence:    Fear of current or ex partner: Not on file    Emotionally abused: Not on file    Physically abused: Not on file    Forced sexual activity: Not on file  Other Topics Concern  . Not on file  Social History Narrative   Works with [employer]  Family History:    Family History  Problem Relation Age of Onset  . Heart disease Father   . Cancer Paternal Uncle        prostate  . Cancer Paternal Uncle        prostate     ROS:  Please see the history of present illness.  Unable to obtain full ROS as patient intubated.  His wife notes he was complaining of hip pain recently.  Otherwise he was not complaining of any issues.   Physical Exam/Data:   Vitals:   11/05/18 0830 11/05/18 0845 11/05/18 0900 11/05/18 0915  BP:   (!) 125/102   Pulse: 78 80 82 81  Resp: 20 20 20 20   Temp:      TempSrc:      SpO2: 100% 100% 100% 100%  Weight:      Height:        Intake/Output Summary (Last 24 hours) at 11/05/2018 0935 Last data filed at 11/05/2018 0900 Gross per 24 hour  Intake 23191.84 ml  Output 15370 ml  Net 7821.84 ml   Last 3 Weights 11/04/2018 03/14/2018 10/16/2016  Weight (lbs) 300 lb 306 lb 315 lb  Weight (kg) 136.079 kg 138.801 kg 142.883 kg     Body mass index is 36.52 kg/m.  General:  Intubated HEENT: normal Lymph: no adenopathy Neck: no JVD Cardiac:  normal S1, S2; RRR; no murmur   Lungs:  Faint exp rhonchi bilat Abd: abd wound dressing dry  Ext: no edema Musculoskeletal:  No deformities  Skin: warm and dry  Neuro:  Patient will arouse to voice commands, briefly   EKG:  The EKG was personally reviewed and  demonstrates:   Normal sinus rhythm, HR 82, TW inversions 2, 3, aVF, 2 mm STE V2, 1 mm STE V3, QTc 432 ECG #2 - unchanged  Abd/Chest CTA 11/04/18 IMPRESSION: Evidence patient's known fusiform infrarenal abdominal aortic aneurysm measuring 5 cm in AP diameter with evidence of acute rupture with moderate adjacent periaortic/retroperitoneal hemorrhage. Rupture is anteriorly just below the takeoff of the inferior mesenteric artery.  Normal thoracic aorta.  Minimal aneurysmal dilatation of the left common iliac artery measuring 1.6 cm (previously 1.8 cm).  Subtle atherosclerotic coronary artery disease.  Critical Value/emergent results were called by telephone at the time of interpretation on 11/05/2018 at 12:05 a.m. to Dr. Veryl Speak , who verbally acknowledged these results.   Laboratory Data:  Chemistry Recent Labs  Lab 11/04/18 2330  11/05/18 0624 11/05/18 0710 11/05/18 0737  NA 138   < > 145 143 145  K 5.7*   < > 5.5* 5.2* 5.2*  CL 106  --   --   --  112*  CO2 21*  --   --   --  22  GLUCOSE 143*  --   --   --  123*  BUN 14  --   --   --  16  CREATININE 1.38*  --   --   --  1.54*  CALCIUM 8.5*  --   --   --  8.3*  GFRNONAA 55*  --   --   --  48*  GFRAA >60  --   --   --  56*  ANIONGAP 11  --   --   --  11   < > = values in this interval not displayed.    No results for input(s): PROT, ALBUMIN, AST, ALT, ALKPHOS, BILITOT in the last 168 hours. Hematology Recent Labs  Lab 11/04/18 2330  11/05/18 0446  11/05/18 0710 11/05/18 0737 11/05/18 0829  WBC 8.7  --   --   --   --  12.9* 12.2*  RBC 4.97  --   --   --   --  5.39 5.53  HGB 15.5   < >  --    < > 14.3 16.3 16.1  HCT 48.7   < >  --    < > 42.0 46.4 47.1  MCV 98.0  --   --   --   --  86.1 85.2  MCH 31.2  --   --   --   --  30.2 29.1  MCHC 31.8  --   --   --   --  35.1 34.2  RDW 13.3  --   --   --   --  16.1* 16.2*  PLT PLATELET CLUMPS NOTED ON SMEAR, COUNT APPEARS ADEQUATE   < > 128*  --   --  95* 102*     < > = values in this interval not displayed.   Cardiac Enzymes Recent Labs  Lab 11/04/18 2330  TROPONINI 0.04*   No results for input(s): TROPIPOC in the last 168 hours.  BNPNo results for input(s): BNP, PROBNP in the last 168 hours.  DDimer  Recent Labs  Lab 11/05/18 0312 11/05/18 0446  DDIMER >20.00* >20.00*    Radiology/Studies:  Dg Chest Port 1 View  Result Date: 11/05/2018 CLINICAL DATA:  Ruptured abdominal aortic aneurysm. EXAM: PORTABLE CHEST 1 VIEW COMPARISON:  Chest CT-11/04/2018 FINDINGS: Grossly unchanged enlarged cardiac silhouette and mediastinal contours given reduced lung volumes and supine positioning. Endotracheal tube overlies the tracheal air column with tip superior to the carina. Left subclavian vein approach large bore central venous catheter tip overlies the superior aspect of the SVC. Enteric tube tip and side port project below the left hemidiaphragm. Pacer pads overlie the left ventricular apex. No pneumothorax. Perihilar heterogeneous opacities favored to represent atelectasis. Pulmonary venous congestion without frank evidence of edema. No acute osseous abnormalities. IMPRESSION: 1. Support apparatus as above.  No pneumothorax. 2. Decreased lung volumes and associated perihilar opacities, likely atelectasis. 3. Cardiomegaly and pulmonary venous congestion without frank evidence of edema. Electronically Signed   By: Sandi Mariscal M.D.   On: 11/05/2018 08:59   Dg Abd Portable 1v  Result Date: 11/05/2018 CLINICAL DATA:  Ruptured abdominal aortic aneurysm. NG tube placement. EXAM: PORTABLE ABDOMEN - 1 VIEW COMPARISON:  CT abdomen pelvis-09/04/2019 FINDINGS: Enteric tube tip and side port overlies expected location of the gastric fundus. Additional external support apparatus overlies the upper abdomen in imaged lower chest. Paucity of bowel gas without evidence of enteric obstruction. Nondiagnostic evaluation for pneumoperitoneum secondary to supine positioning and  exclusion of the lower thorax. No pneumatosis or portal venous gas. Midline skin staples. Multilevel degenerative change of the thoracolumbar spine. Limited visualization of the lung bases demonstrates small bilateral effusions with associated bibasilar opacities, atelectasis versus infiltrate. IMPRESSION: 1. Enteric tube tip and side port overlies expected location of the gastric fundus. 2. Paucity of bowel gas without evidence of enteric obstruction. Electronically Signed   By: Sandi Mariscal M.D.   On: 11/05/2018 08:57   Ct Angio Chest/abd/pel For Dissection W And/or Wo Contrast  Result Date: 11/05/2018 CLINICAL DATA:  History of abdominal aortic aneurysm. Syncopal episode. Suspected aortic dissection. EXAM: CT ANGIOGRAPHY CHEST, ABDOMEN AND PELVIS TECHNIQUE: Multidetector CT imaging through the chest, abdomen and pelvis was performed using the standard protocol  during bolus administration of intravenous contrast. Multiplanar reconstructed images and MIPs were obtained and reviewed to evaluate the vascular anatomy. CONTRAST:  136mL ISOVUE-370 IOPAMIDOL (ISOVUE-370) INJECTION 76% COMPARISON:  02/18/2018 FINDINGS: CTA CHEST FINDINGS Cardiovascular: Heart is normal size. Minimal calcified plaque over the left anterior descending coronary artery. Thoracic aorta is normal in caliber without evidence of aneurysm or dissection. Visualized pulmonary arterial system is within normal. Mediastinum/Nodes: No mediastinal or hilar adenopathy. Small hiatal hernia. Lungs/Pleura: Lungs are adequately inflated without focal airspace consolidation or effusion. Airways are normal. Musculoskeletal: Degenerative change of the spine. Review of the MIP images confirms the above findings. CTA ABDOMEN AND PELVIS FINDINGS VASCULAR Aorta: Evidence of patient's known infrarenal fusiform abdominal aortic aneurysm measuring 5 cm in AP diameter (previously 4.1 cm). There is evidence of acute rupture of this aneurysm with site of rupture  anteriorly just below the level of the inferior mesenteric artery. Moderate amount of periaortic/retroperitoneal hemorrhage. Below the level of rupture of the aorta is well opacified. Celiac: Normal. SMA: Normal. Renals: Normal. IMA: Unopacified and not visualized. Inflow: Left common iliac artery measures 1.6 cm in diameter (previously 1.8 cm). Right common iliac artery normal in caliber. Veins: Normal. Review of the MIP images confirms the above findings. NON-VASCULAR Hepatobiliary: Normal. Pancreas: Normal. Spleen: Normal. Adrenals/Urinary Tract: Unremarkable. Stomach/Bowel: Mild gastric distension. Small bowel is normal. Appendix is normal. Colon is normal. Lymphatic: No adenopathy. Reproductive: Normal. Other: None. Musculoskeletal: Degenerative change of the spine and hips. Review of the MIP images confirms the above findings. IMPRESSION: Evidence patient's known fusiform infrarenal abdominal aortic aneurysm measuring 5 cm in AP diameter with evidence of acute rupture with moderate adjacent periaortic/retroperitoneal hemorrhage. Rupture is anteriorly just below the takeoff of the inferior mesenteric artery. Normal thoracic aorta. Minimal aneurysmal dilatation of the left common iliac artery measuring 1.6 cm (previously 1.8 cm). Subtle atherosclerotic coronary artery disease. Critical Value/emergent results were called by telephone at the time of interpretation on 11/05/2018 at 12:05 a.m. to Dr. Veryl Speak , who verbally acknowledged these results. Electronically Signed   By: Marin Olp M.D.   On: 11/05/2018 00:15    Assessment and Plan:   1. Abnormal ECG Patient with ST elevation in V2 and V3, TWI inferiorly.  Findings are not classic for acute STEMI.  Patient is not a candidate for emergent Cardiac Catheterization given AAA repair this morning.  He does not seem to be experiencing chest pain.  However, hx is limited as he is currently intubated.  Will continue to monitor closely.  Obtain serial  Troponin levels.  Obtain urgent 2D echo.   2. HTN Currently receiving prn beta-blocker, hydralazine as needed for high BP.  3. AAA Rupture s/p Repair Per vascular surgery.    For questions or updates, please contact [place] Please consult [email or web address] for contact info under   Signed, Richardson Dopp, PA-C  11/05/2018 9:35 AM   Patient seen with PA, agree with the above note.   He presented to [facility] last night with ruptured AAA.  Had about 15 minutes CPR, transferred to [facility] and this morning had AAA repair.    Currently hemodynamically stable but CCM MD noted ECG changes and asked for cardiology to evaluate.   His ECG shows new concave up ST in V2/V3 and inferior TWIs.  No Qs.  ECG was the same when repeated an hour later.  Clear changes from initial ECG.  No troponin is back yet.  Patient is awake on vent, appears to deny  chest pain.    Echo was done and reviewed at bedside.  He has normal LV size, moderate LVH, global hypokinesis with EF in the 20-25% range.   On exam, intubated but awake.  Marked abdominal distention.  Decreased BS at bases.  No edema.   ECG not classic anterior "tomb stones" and no Qs, but clear change.  However, he just had repair of ruptured AAA.  I discussed with Dr. Oneida Alar, he would not be a candidate for cath lab/aggressive antiplatelets/anticoagulation at this point.  Can give ASA 81 and statin.  He appears to be having no chest pain and though EF is low on echo, there is global hypokinesis and could be representative of stunning post-arrest and CPR.  - ASA 81 and statin as above.  - Cycle troponin.  - Serial ECGs.   We will follow with you.   Loralie Champagne 11/05/2018 9:54 AM

## 2018-11-05 NOTE — Consult Note (Signed)
NAME:  Robert [name], MRN:  [id number], DOB:  Mar 04, 1956, LOS: 0 ADMISSION DATE:  11/04/2018, CONSULTATION DATE:  11/05/2018 0715 REFERRING MD:  Oneida Alar, MD Vascular, CHIEF COMPLAINT:  Ruptured AAA   Brief History   Evaluated in CV ICU post exploratory lap for ruptured AAA. Significant amount of blood loss estimated at 1400 cc. Received multiple units of blood, platelets and FFP. Intubated with glidescope without difficulty. Hypotensive initially, now off pressors. Given paralyzing agent and sedation prior to arrival to unit. Had left subclavian CVC and right radial arterial catheter placed.  No vascular reanastamosis. Had graft placed infrarenal. Bowel appeared "purple" but improved at end of case. No bowel perforation or enterotomy.  Has history of AAA and renal artery stenosis requiring angioplasty.  Found to have ECG changes post-operatively this morning and stat echocardiogram shows global reduction in EF of 20%  History of present illness    63 y.o. male, presented to [facility] med center with abdominal pain. Pain became severe was there he was diagnosed with a ruptured abdominal aortic aneurysm.  Became syncopal and pulseless requiring a  brief course of CPR at [facility].Was not intubated at the time.  Transferred by EMS directly to the operating room at [facility]  The patient was awake on arrival to the hospital and complaining of abdominal pain.    Known abdominal aortic aneurysm and scheduled for follow-up in December 2019 but did not show up.  Past Medical History   Past Medical History:  Diagnosis Date  . AAA (abdominal aortic aneurysm, ruptured) ([place])   . Fibromuscular dysplasia of renal artery (HCC)    s/p angioplasty to both renal arteries in 2006  . History of cardiac catheterization    LHC in 2013:  no sig CAD  . Hyperlipidemia   . Hypertension    Past Surgical History:  Procedure Laterality Date  . CARDIAC SURGERY  2006   stent placement  . HERNIA REPAIR      . INSERTION OF MESH  08/31/2012   Procedure: INSERTION OF MESH;  Surgeon: Imogene Burn. Georgette Dover, MD;  Location: [place];  Service: General;  Laterality: N/A;  . KNEE SURGERY  2009/2010   right  . KNEE SURGERY  2010   right knee partial replacement  . LEFT HEART CATHETERIZATION WITH CORONARY ANGIOGRAM N/A 05/19/2012   Procedure: LEFT HEART CATHETERIZATION WITH CORONARY ANGIOGRAM;  Surgeon: Pixie Casino, MD;  Location: [facility] CATH LAB;  Service: Cardiovascular;  Laterality: N/A;  . UMBILICAL HERNIA REPAIR  08/31/2012   Procedure: HERNIA REPAIR UMBILICAL ADULT;  Surgeon: Imogene Burn. Georgette Dover, MD;  Location: [place];  Service: General;  Laterality: N/A;  umbilical hernia repair with mesh   Significant Hospital Events   Post-operative STEMI with reduced EF and troponin  Consults:  [facility]  Procedures:  Repair of AAA rupture: 2.15.2020 Placement of CVC: 2.14.2020 Placement of Arterial Cannula: 2.14.2020 Intubation for surgery: 2.14.2020  Significant Diagnostic Tests:  CT-scan of abdomen and pelvis Echo: IMPRESSIONS    1. The left ventricle has severely reduced systolic function, with an ejection fraction of 20-25%. The cavity size was normal. There is moderately increased left ventricular wall thickness. Left ventricular diastolic Doppler parameters are consistent  with impaired relaxation Left ventricular diffuse hypokinesis.  2. The right ventricle has moderately reduced systolic function. The cavity was mildly enlarged. There is mildly increased right ventricular wall thickness.  3. The mitral valve is normal in structure. No evidence of mitral  valve stenosis. No significant regurgitation.  4. The tricuspid valve is normal in structure.  5. The aortic valve is tricuspid no stenosis of the aortic valve.  6. The pulmonic valve was normal in structure.  7. The aortic root and ascending aorta are normal in size and structure.  8. No complete TR  doppler jet so unable to estimate PA systolic pressure.  FINDINGS  Left Ventricle: The left ventricle has severely reduced systolic function, with an ejection fraction of 20-25%. The cavity size was normal. There is moderately increased left ventricular wall thickness. Left ventricular diastolic Doppler parameters are  consistent with impaired relaxation Left ventricular diffuse hypokinesis. Right Ventricle: The right ventricle has moderately reduced systolic function. The cavity was mildly enlarged. There is mildly increased right ventricular wall thickness. Left Atrium: left atrial size was normal in size Right Atrium: right atrial size was normal in size Right atrial pressure is estimated at 0 mmHg. Interatrial Septum: No atrial level shunt detected by color flow Doppler. Pericardium: There is no evidence of pericardial effusion. Mitral Valve: The mitral valve is normal in structure. Mitral valve regurgitation is not visualized by color flow Doppler. No evidence of mitral valve stenosis. Tricuspid Valve: The tricuspid valve is normal in structure. Tricuspid valve regurgitation was not visualized by color flow Doppler. Aortic Valve: The aortic valve is tricuspid Aortic valve regurgitation was not visualized by color flow Doppler. There is no stenosis of the aortic valve. Pulmonic Valve: The pulmonic valve was normal in structure. Pulmonic valve regurgitation is not visualized by color flow Doppler. Aorta: The aortic root and ascending aorta are normal in size and structure. Venous: The inferior vena cava was not well visualized.   LEFT VENTRICLE PLAX 2D (Teich) LV EF:          44.8 %   Diastology LVIDd:          4.10 cm  LV e' lateral:   4.38 cm/s LVIDs:          3.20 cm  LV E/e' lateral: 7.4 LV PW:          1.10 cm  LV e' medial:    3.57 cm/s LV IVS:         1.10 cm  LV E/e' medial:  9.1 LVOT diam:      2.50 cm LV SV:          33 ml LVOT Area:      4.91 cm  RIGHT VENTRICLE RV S  prime:     5.35 cm/s TAPSE (M-mode): 0.8 cm  LEFT ATRIUM             Index       RIGHT ATRIUM           Index LA diam:        3.80 cm 1.44 cm/m  RA Pressure: 0 mmHg LA Vol (A2C):   34.6 ml 13.15 ml/m RA Area:     13.80 cm LA Vol (A4C):   42.8 ml 16.26 ml/m RA Volume:   31.70 ml  12.04 ml/m LA Biplane Vol: 39.4 ml 14.97 ml/m    AORTA Ao Root diam: 3.60 cm  MV E velocity: 32.50 cm/s MV A velocity: 69.40 cm/s MV E/A ratio:  0.47      Micro Data:  N/A  Antimicrobials:  Post-surgical      Objective   Blood pressure (!) 148/106, pulse 92, temperature 99.3 F (37.4 C), temperature source Esophageal, resp. rate (!) 22, height 6\' 4"  (  1.93 m), weight 136.1 kg, SpO2 98 %.    Vent Mode: PRVC FiO2 (%):  [50 %-100 %] 50 % Set Rate:  [20 bmp] 20 bmp Vt Set:  [690 mL] 690 mL PEEP:  [10 cmH20] 10 cmH20 Plateau Pressure:  [25 cmH20-29 cmH20] 25 cmH20   Intake/Output Summary (Last 24 hours) at 11/05/2018 1613 Last data filed at 11/05/2018 1600 Gross per 24 hour  Intake 24626.91 ml  Output 15445 ml  Net 9181.91 ml   Filed Weights   11/04/18 2311  Weight: 136.1 kg    Examination: General: Intubated, obese, non-toxic, ill, nad. Initially paralyzed and unresponsive, now following commands.  HENT: Reactive, no nystagmus, no icterus, no JVD. Left subclavian Central venous catheter in place. No hematoma Lungs: Clear bilaterally. No wheezing Cardiovascular: R3, S1 S2, No audible Murmur or gallop Abdomen: Post surgical, distended but not firm, incision clean and dry, no bleeding. Hypoactive BS Extremities: Warm, no mottling, no swelling. Pulses: Palpable bilaterally but weak 1+/2+ Neuro:Following commands, moving upper and lower extremities. GCS: 3-2i-6 (attempts to speak) GU: Pressure foley in place. Femoral access noted for catheter placement for bleeding control during case. No hematoma. No catheter or sheath noted   Assessment & Plan:  1. Hemorrhagic shock 2. Ruptured  AAA 3. Acute renal failure 4. Lactic acidosis 5. Significant blood product need 6. STEMI 7. Cardiomyopathy, concern for global dysfunction related to hemorrhagic shock 8. Post-cardiac arrest 9. Encounter for ventilator wean 10.   Plans: 1. Follow UOP 2. Follow bladder pressure 3. Fluid resuscitation with low CVP on echo 4. Repeat lactic acid 5. UDS--patient uses THC, check for sympathomimetic use 6. Wean ventilator as metabolic processes stabilize 7. PEEP to prevent atelectasis 8. Follow for TRALI, TACO 9. Control Bp 10. Frequent exams  Best practice:  Diet: per surgery Pain/Anxiety/Delirium protocol (if indicated): Sedation wean as tolereated VAP protocol (if indicated):8 ml/kg , peep at 10 DVT prophylaxis: Per surgery GI prophylaxis: Pepcid reduced for GFR Glucose control: Follow Mobility: Vent wean Code Status: Full Family Communication: Discussed with wife Disposition: In CV ICU  Labs   CBC: Recent Labs  Lab 11/04/18 2330  11/05/18 0312  11/05/18 0446  11/05/18 0624 11/05/18 0710 11/05/18 0737 11/05/18 0829 11/05/18 1207  WBC 8.7  --   --   --   --   --   --   --  12.9* 12.2*  --   NEUTROABS 5.5  --   --   --   --   --   --   --   --  9.1*  --   HGB 15.5   < >  --    < >  --    < > 14.3 14.3 16.3 16.1 16.1  HCT 48.7   < >  --    < >  --    < > 42.0 42.0 46.4 47.1 47.0  MCV 98.0  --   --   --   --   --   --   --  86.1 85.2  --   PLT PLATELET CLUMPS NOTED ON SMEAR, COUNT APPEARS ADEQUATE  --  96*  --  128*  --   --   --  95* 102*  --    < > = values in this interval not displayed.    Basic Metabolic Panel: Recent Labs  Lab 11/04/18 2330  11/05/18 7564 11/05/18 0710 11/05/18 0737 11/05/18 0829 11/05/18 1207  NA 138   < > 145 143  145 144 144  K 5.7*   < > 5.5* 5.2* 5.2* 5.1 4.7  CL 106  --   --   --  112* 112* 111  CO2 21*  --   --   --  22 21* 22  GLUCOSE 143*  --   --   --  123* 160* 146*  BUN 14  --   --   --  16 18 21   CREATININE 1.38*  --    --   --  1.54* 1.76* 2.32*  CALCIUM 8.5*  --   --   --  8.3* 8.1* 7.8*  MG  --   --   --   --  1.6*  --   --    < > = values in this interval not displayed.   GFR: Estimated Creatinine Clearance: 50.4 mL/min (A) (by C-G formula based on SCr of 2.32 mg/dL (H)). Recent Labs  Lab 11/04/18 2330 11/05/18 0737 11/05/18 0829 11/05/18 1207 11/05/18 1446  WBC 8.7 12.9* 12.2*  --   --   LATICACIDVEN  --  5.5* 4.2* 4.0* 3.9*    Liver Function Tests: Recent Labs  Lab 11/05/18 0829  AST 61*  ALT 25  ALKPHOS 38  BILITOT 2.0*  PROT 4.2*  ALBUMIN 2.5*   No results for input(s): LIPASE, AMYLASE in the last 168 hours. Recent Labs  Lab 11/05/18 0834  AMMONIA 39*    ABG    Component Value Date/Time   PHART 7.424 11/05/2018 0710   PCO2ART 40.2 11/05/2018 0710   PO2ART 303.0 (H) 11/05/2018 0710   HCO3 27.0 11/05/2018 0710   TCO2 28 11/05/2018 0710   ACIDBASEDEF 3.0 (H) 11/05/2018 0624   O2SAT 100.0 11/05/2018 0710     Coagulation Profile: Recent Labs  Lab 11/05/18 0312 11/05/18 0446 11/05/18 0829  INR 1.57 1.48 1.43    Cardiac Enzymes: Recent Labs  Lab 11/04/18 2330 11/05/18 0829 11/05/18 1446  TROPONINI 0.04* 2.02* 5.71*    HbA1C: No results found for: HGBA1C  CBG: No results for input(s): GLUCAP in the last 168 hours.  Review of Systems:   Unable to obtain from patient Wife endorses sedentary secondary to right hip pain She is unaware of any chest pain but he has had shoulder pain. No DOE  Past Medical History  He,  has a past medical history of AAA (abdominal aortic aneurysm, ruptured) ([place]), Fibromuscular dysplasia of renal artery ([place]), History of cardiac catheterization, Hyperlipidemia, and Hypertension.   Surgical History    Past Surgical History:  Procedure Laterality Date  . CARDIAC SURGERY  2006   stent placement  . HERNIA REPAIR    . INSERTION OF MESH  08/31/2012   Procedure: INSERTION OF MESH;  Surgeon: Imogene Burn. Georgette Dover, MD;  Location:  [place];  Service: General;  Laterality: N/A;  . KNEE SURGERY  2009/2010   right  . KNEE SURGERY  2010   right knee partial replacement  . LEFT HEART CATHETERIZATION WITH CORONARY ANGIOGRAM N/A 05/19/2012   Procedure: LEFT HEART CATHETERIZATION WITH CORONARY ANGIOGRAM;  Surgeon: Pixie Casino, MD;  Location: [facility] CATH LAB;  Service: Cardiovascular;  Laterality: N/A;  . UMBILICAL HERNIA REPAIR  08/31/2012   Procedure: HERNIA REPAIR UMBILICAL ADULT;  Surgeon: Imogene Burn. Georgette Dover, MD;  Location: [place];  Service: General;  Laterality: N/A;  umbilical hernia repair with mesh     Social History   reports that he has never smoked. He has never used  smokeless tobacco. He reports current alcohol use. He reports current drug use. Drug: Marijuana.   Family History   His family history includes Cancer in his paternal uncle and paternal uncle; Heart disease in his father.   Allergies No Known Allergies   Home Medications  Prior to Admission medications   Medication Sig Start Date End Date Taking? Authorizing Provider  aspirin EC 325 MG tablet Take 325 mg by mouth daily.    [provider]  benazepril-hydrochlorthiazide (LOTENSIN HCT) 20-12.5 MG per tablet Take 2 tablets by mouth daily. Takes 2 pills at once    [provider]  NIFEdipine (ADALAT CC) 90 MG 24 hr tablet Take 90 mg by mouth daily.    [provider]  rosuvastatin (CRESTOR) 20 MG tablet Take 20 mg by mouth daily.    [provider]  sildenafil (VIAGRA) 100 MG tablet  10/13/16   [provider]     Critical care time: 110 min

## 2018-11-05 NOTE — Progress Notes (Addendum)
[facility]  Subjective  - sedated on vent   Objective (!) 151/130 93 99.3 F (37.4 C) (Esophageal) (!) 22 97%  Intake/Output Summary (Last 24 hours) at 11/05/2018 1535 Last data filed at 11/05/2018 1400 Gross per 24 hour  Intake 24143.93 ml  Output 15445 ml  Net 8698.93 ml   Abdomen soft obese Extremities 2+ PT right 1+ DP left 50% FIO2 Anuric over last 4-6 hours  Assessment/Planning: Coags corrected with FFP platelet Does not appear to be bleeding hemodynamically stable Will give 2 liters saline now to fluid challenge kidneys will chase with lasix later today if remains anuric  Will start checking CVP will shoot for 10-12 with fluid resusc.  May need to start hemodialysis tomorrow if no improvement  Probable MI Aspirin ok otherwise only supportive care for now  NO TUBE FEEDS without my approval  Ruta Hinds 11/05/2018 3:35 PM --  Laboratory Lab Results: Recent Labs    11/05/18 0737 11/05/18 0829 11/05/18 1207  WBC 12.9* 12.2*  --   HGB 16.3 16.1 16.1  HCT 46.4 47.1 47.0  PLT 95* 102*  --    BMET Recent Labs    11/05/18 0829 11/05/18 1207  NA 144 144  K 5.1 4.7  CL 112* 111  CO2 21* 22  GLUCOSE 160* 146*  BUN 18 21  CREATININE 1.76* 2.32*  CALCIUM 8.1* 7.8*    COAG Lab Results  Component Value Date   INR 1.43 11/05/2018   INR 1.48 11/05/2018   INR 1.57 11/05/2018   No results found for: PTT

## 2018-11-05 NOTE — Progress Notes (Signed)
ETT noted to be 5.5 cm above carina on CXR.  Patient with persistent loss of VT with vent.  Tube advanced 2cm and secured 26 cm at the lip.  Returned tidal volumes coinciding with set Vt.

## 2018-11-05 NOTE — ED Provider Notes (Signed)
[facility] Provider Note   CSN: [id number] Arrival date & time: 11/04/18  2301     History   Chief Complaint Chief Complaint  Patient presents with  . Abdominal Pain    HPI Robert [name] is a 63 y.o. male.  Patient is a 63 year old male with past medical history of known abdominal aortic aneurysm, last noted to be 4.8 cm in June 2019.  He presents today with complaints of lower abdominal pain.  This began earlier in the day and is worsening.  He denies any fevers or chills.  He denies any injury or trauma.  Additional history limited secondary to patient experiencing cardiac arrest prior to my evaluation.  The history is provided by the patient.    Past Medical History:  Diagnosis Date  . AAA (abdominal aortic aneurysm, ruptured) ([place])   . Hyperlipidemia   . Hypertension     Patient Active Problem List   Diagnosis Date Noted  . Ruptured abdominal aortic aneurysm (AAA) ([place]) 11/05/2018  . Umbilical hernia 40/06/2724    Past Surgical History:  Procedure Laterality Date  . CARDIAC SURGERY  2006   stent placement  . HERNIA REPAIR    . INSERTION OF MESH  08/31/2012   Procedure: INSERTION OF MESH;  Surgeon: Imogene Burn. Georgette Dover, MD;  Location: [facility];  Service: General;  Laterality: N/A;  . KNEE SURGERY  2009/2010   right  . KNEE SURGERY  2010   right knee partial replacement  . LEFT HEART CATHETERIZATION WITH CORONARY ANGIOGRAM N/A 05/19/2012   Procedure: LEFT HEART CATHETERIZATION WITH CORONARY ANGIOGRAM;  Surgeon: Pixie Casino, MD;  Location: [facility] CATH LAB;  Service: Cardiovascular;  Laterality: N/A;  . UMBILICAL HERNIA REPAIR  08/31/2012   Procedure: HERNIA REPAIR UMBILICAL ADULT;  Surgeon: Imogene Burn. Georgette Dover, MD;  Location: [place];  Service: General;  Laterality: N/A;  umbilical hernia repair with mesh        Home Medications    Prior to Admission medications   Medication Sig Start Date End Date  Taking? Authorizing Provider  aspirin EC 325 MG tablet Take 325 mg by mouth daily.    [provider]  benazepril-hydrochlorthiazide (LOTENSIN HCT) 20-12.5 MG per tablet Take 2 tablets by mouth daily. Takes 2 pills at once    [provider]  NIFEdipine (ADALAT CC) 90 MG 24 hr tablet Take 90 mg by mouth daily.    [provider]  rosuvastatin (CRESTOR) 20 MG tablet Take 20 mg by mouth daily.    [provider]  sildenafil (VIAGRA) 100 MG tablet  10/13/16   [provider]    Family History Family History  Problem Relation Age of Onset  . Heart disease Father   . Cancer Paternal Uncle        prostate  . Cancer Paternal Uncle        prostate    Social History Social History   Tobacco Use  . Smoking status: Never Smoker  . Smokeless tobacco: Never Used  Substance Use Topics  . Alcohol use: Yes  . Drug use: No     Allergies   Patient has no known allergies.   Review of Systems Review of Systems  Unable to perform ROS: Acuity of condition     Physical Exam Updated Vital Signs BP 115/89 (BP Location: Left Arm)   Pulse (!) 117   Temp (!) 96.9 F (36.1 C) (Tympanic)   Resp (!) 29  Ht 6\' 4"  (1.93 m)   Wt 136.1 kg   SpO2 100%   BMI 36.52 kg/m   Physical Exam Vitals signs and nursing note reviewed.  Constitutional:      General: He is in acute distress.     Appearance: He is well-developed. He is ill-appearing and diaphoretic.  HENT:     Head: Normocephalic and atraumatic.  Neck:     Musculoskeletal: Normal range of motion and neck supple.  Cardiovascular:     Rate and Rhythm: Normal rate and regular rhythm.     Heart sounds: No murmur. No friction rub.  Pulmonary:     Effort: Pulmonary effort is normal. No respiratory distress.     Breath sounds: Normal breath sounds. No wheezing or rales.  Abdominal:     General: Bowel sounds are normal. There is no distension.     Palpations: Abdomen is soft.     Tenderness:  There is abdominal tenderness in the right lower quadrant, suprapubic area and left lower quadrant.  Musculoskeletal: Normal range of motion.  Skin:    General: Skin is warm.     Coloration: Skin is pale.  Neurological:     Mental Status: He is oriented to person, place, and time.     Coordination: Coordination normal.      ED Treatments / Results  Labs (all labs ordered are listed, but only abnormal results are displayed) Labs Reviewed  BASIC METABOLIC PANEL - Abnormal; Notable for the following components:      Result Value   Potassium 5.7 (*)    CO2 21 (*)    Glucose, Bld 143 (*)    Creatinine, Ser 1.38 (*)    Calcium 8.5 (*)    GFR calc non Af Amer 55 (*)    All other components within normal limits  TROPONIN I - Abnormal; Notable for the following components:   Troponin I 0.04 (*)    All other components within normal limits  DIC (DISSEMINATED INTRAVASCULAR COAGULATION) PANEL - Abnormal; Notable for the following components:   Prothrombin Time 18.6 (*)    aPTT 37 (*)    Fibrinogen 158 (*)    Platelets 96 (*)    All other components within normal limits  CBC WITH DIFFERENTIAL/PLATELET  TYPE AND SCREEN  TYPE AND SCREEN  PREPARE FRESH FROZEN PLASMA  PREPARE PLATELET PHERESIS  ABO/RH  PREPARE PLATELET PHERESIS  PREPARE RBC (CROSSMATCH)    EKG EKG Interpretation  Date/Time:  Friday November 04 2018 23:22:42 EST Ventricular Rate:  103 PR Interval:    QRS Duration: 105 QT Interval:  355 QTC Calculation: 465 R Axis:   -29 Text Interpretation:  Ectopic atrial tachycardia, unifocal Borderline left axis deviation Probable lateral infarct, old Repol abnrm, severe global ischemia (LM/MVD) Confirmed by Veryl Speak [phone]) on 11/05/2018 12:00:22 AM   Radiology Ct Angio Chest/abd/pel For Dissection W And/or Wo Contrast  Result Date: 11/05/2018 CLINICAL DATA:  History of abdominal aortic aneurysm. Syncopal episode. Suspected aortic dissection. EXAM: CT ANGIOGRAPHY  CHEST, ABDOMEN AND PELVIS TECHNIQUE: Multidetector CT imaging through the chest, abdomen and pelvis was performed using the standard protocol during bolus administration of intravenous contrast. Multiplanar reconstructed images and MIPs were obtained and reviewed to evaluate the vascular anatomy. CONTRAST:  131mL ISOVUE-370 IOPAMIDOL (ISOVUE-370) INJECTION 76% COMPARISON:  02/18/2018 FINDINGS: CTA CHEST FINDINGS Cardiovascular: Heart is normal size. Minimal calcified plaque over the left anterior descending coronary artery. Thoracic aorta is normal in caliber without evidence of aneurysm or dissection. Visualized  pulmonary arterial system is within normal. Mediastinum/Nodes: No mediastinal or hilar adenopathy. Small hiatal hernia. Lungs/Pleura: Lungs are adequately inflated without focal airspace consolidation or effusion. Airways are normal. Musculoskeletal: Degenerative change of the spine. Review of the MIP images confirms the above findings. CTA ABDOMEN AND PELVIS FINDINGS VASCULAR Aorta: Evidence of patient's known infrarenal fusiform abdominal aortic aneurysm measuring 5 cm in AP diameter (previously 4.1 cm). There is evidence of acute rupture of this aneurysm with site of rupture anteriorly just below the level of the inferior mesenteric artery. Moderate amount of periaortic/retroperitoneal hemorrhage. Below the level of rupture of the aorta is well opacified. Celiac: Normal. SMA: Normal. Renals: Normal. IMA: Unopacified and not visualized. Inflow: Left common iliac artery measures 1.6 cm in diameter (previously 1.8 cm). Right common iliac artery normal in caliber. Veins: Normal. Review of the MIP images confirms the above findings. NON-VASCULAR Hepatobiliary: Normal. Pancreas: Normal. Spleen: Normal. Adrenals/Urinary Tract: Unremarkable. Stomach/Bowel: Mild gastric distension. Small bowel is normal. Appendix is normal. Colon is normal. Lymphatic: No adenopathy. Reproductive: Normal. Other: None.  Musculoskeletal: Degenerative change of the spine and hips. Review of the MIP images confirms the above findings. IMPRESSION: Evidence patient's known fusiform infrarenal abdominal aortic aneurysm measuring 5 cm in AP diameter with evidence of acute rupture with moderate adjacent periaortic/retroperitoneal hemorrhage. Rupture is anteriorly just below the takeoff of the inferior mesenteric artery. Normal thoracic aorta. Minimal aneurysmal dilatation of the left common iliac artery measuring 1.6 cm (previously 1.8 cm). Subtle atherosclerotic coronary artery disease. Critical Value/emergent results were called by telephone at the time of interpretation on 11/05/2018 at 12:05 a.m. to Dr. Veryl Speak , who verbally acknowledged these results. Electronically Signed   By: Marin Olp M.D.   On: 11/05/2018 00:15    Procedures Procedures (including critical care time)  Medications Ordered in ED Medications  0.9 %  sodium chloride infusion (Manually program via Guardrails IV Fluids) (has no administration in time range)  EPINEPHrine (ADRENALIN) 4 mg in dextrose 5 % 250 mL (0.016 mg/mL) infusion (has no administration in time range)  norepinephrine (LEVOPHED) 4mg  in 238mL premix infusion (has no administration in time range)  vasopressin (PITRESSIN) 40 Units in sodium chloride 0.9 % 250 mL (0.16 Units/mL) infusion (has no administration in time range)  0.9 % irrigation (POUR BTL) (4,000 mLs Irrigation Given 11/05/18 0222)  heparin 6,000 Units in sodium chloride 0.9 % 500 mL irrigation (500 mLs Irrigation Given 11/05/18 0222)  iodixanol (VISIPAQUE) 320 MG/ML injection (150 mLs Intravenous Given 11/05/18 0223)  iopamidol (ISOVUE-370) 76 % injection 100 mL (100 mLs Intravenous Contrast Given 11/04/18 2335)  sodium chloride 0.9 % bolus 1,000 mL (0 mLs Intravenous Stopped 11/04/18 2357)  sodium chloride 0.9 % bolus 1,000 mL (0 mLs Intravenous Stopped 11/04/18 2358)  0.9 %  sodium chloride infusion (Manually program  via Guardrails IV Fluids) ( Intravenous New Bag/Given 11/05/18 0008)     Initial Impression / Assessment and Plan / ED Course  I have reviewed the triage vital signs and the nursing notes.  Pertinent labs & imaging results that were available during my care of the patient were reviewed by me and considered in my medical decision making (see chart for details).  Patient with known abdominal aortic aneurysm last known to be 4.8 cm in June 2019.  He presents today with complaints of severe lower abdominal pain that began earlier today.  The patient was asked to give a urine sample.  While he was in the bathroom attempting to give  this specimen and prior to my initial evaluation, the patient fell to the floor and became unresponsive.  I was called emergently to see the patient.  He was on the floor unresponsive with agonal snoring respirations.  I was unable to obtain a pulse and CPR was initiated.  After several minutes of CPR, the patient regained consciousness and came alert and oriented.  He was then lifted off of the floor and into the exam stretcher where he was found to be markedly hypotensive.  IV access was obtained and the patient was given intravenous fluids.  Arrangements were made for a stat CT scan of the chest, abdomen, and pelvis as I suspected a ruptured aneurysm.  The suspicion was confirmed and an emergent call was placed to vascular surgery.  I spoke to Dr. Oneida Alar who agreed to accept the patient in transfer.  [facility] was summoned on an emergent basis for transport.  I have updated the patient and his wife of these findings.  Both understand that patient is critically ill and quite possibly might not survive.  CRITICAL CARE Performed by: Veryl Speak Total critical care time: 70 minutes Critical care time was exclusive of separately billable procedures and treating other patients. Critical care was necessary to treat or prevent imminent or life-threatening deterioration. Critical  care was time spent personally by me on the following activities: development of treatment plan with patient and/or surrogate as well as nursing, discussions with consultants, evaluation of patient's response to treatment, examination of patient, obtaining history from patient or surrogate, ordering and performing treatments and interventions, ordering and review of laboratory studies, ordering and review of radiographic studies, pulse oximetry and re-evaluation of patient's condition.   Final Clinical Impressions(s) / ED Diagnoses   Final diagnoses:  Ruptured abdominal aortic aneurysm (AAA) [facility])    ED Discharge Orders    None       Veryl Speak, MD 11/05/18 [phone]

## 2018-11-05 NOTE — ED Notes (Signed)
Pt transferred to [facility] stretcher and rolling out emergent to [facility] ED. Loren Racer, pt's wife following in POV.

## 2018-11-05 NOTE — Progress Notes (Signed)
CRITICAL VALUE ALERT  Critical Value:  Lactic Acid 5.5  Date & Time Notied:  11/05/2018 0915  Provider Notified: Isac Sarna   Orders Received/Actions taken: will continue to trend levels

## 2018-11-05 NOTE — Progress Notes (Signed)
   Troponin trending up. Overall stable. ECG improved. PLAN:  1. Continue current therapy. 2. Will try to touch base with Dr. Oneida Alar to see when Heparin can be started. Richardson Dopp, PA-C    11/05/2018 5:38 PM

## 2018-11-05 NOTE — Progress Notes (Signed)
EKG CRITICAL VALUE     12 lead EKG performed.  Critical value noted.  Liliane Shi, RN notified.   Genia Plants, CCT 11/05/2018 11:10 AM

## 2018-11-05 NOTE — ED Notes (Signed)
Gave report to Jeri Modena, RN [facility]

## 2018-11-05 NOTE — ED Notes (Signed)
[facility] notified (Tammy) STAT consult to Vascular Surgeon and truck for transport (Emergency)  Contacted ED @ [facility]) and transferred to Dr. Stark Jock

## 2018-11-05 NOTE — Anesthesia Procedure Notes (Signed)
Arterial Line Insertion Start/End2/15/2020 1:10 AM, 11/05/2018 1:25 AM Performed by: Clovis Cao, CRNA, CRNA  Patient location: OR. Preanesthetic checklist: patient identified, IV checked, surgical consent, monitors and equipment checked, pre-op evaluation and timeout performed radial was placed Catheter size: 20 G Hand hygiene performed  and maximum sterile barriers used   Attempts: 1 Procedure performed without using ultrasound guided technique. Ultrasound Notes:anatomy identified, needle tip was noted to be adjacent to the nerve/plexus identified and no ultrasound evidence of intravascular and/or intraneural injection Following insertion, Biopatch and dressing applied. Post procedure assessment: normal  Patient tolerated the procedure well with no immediate complications.

## 2018-11-05 NOTE — Anesthesia Procedure Notes (Signed)
Procedure Name: Intubation Date/Time: 11/05/2018 1:59 AM Performed by: Claris Che, CRNA Pre-anesthesia Checklist: Patient identified, Emergency Drugs available, Suction available, Patient being monitored and Timeout performed Patient Re-evaluated:Patient Re-evaluated prior to induction Oxygen Delivery Method: Circle system utilized Preoxygenation: Pre-oxygenation with 100% oxygen Induction Type: IV induction, Rapid sequence and Cricoid Pressure applied Laryngoscope Size: Glidescope Grade View: Grade I Tube type: Oral Tube size: 8.0 mm Number of attempts: 2 (One attempt with Mac 3- no attempt at placing ETT. DL x 1 with Glidescope with easy placement of ETT) Airway Equipment and Method: Stylet and Video-laryngoscopy Placement Confirmation: ETT inserted through vocal cords under direct vision and positive ETCO2 Secured at: 23 cm Tube secured with: Tape Dental Injury: Teeth and Oropharynx as per pre-operative assessment

## 2018-11-05 NOTE — ED Notes (Signed)
Pt was placed on stretcher and taken to resuscitation room where BP was 66/33 and IV access obtained.

## 2018-11-05 NOTE — Progress Notes (Signed)
CRITICAL VALUE ALERT  Critical Value:  Lactic 4.1  Date & Time Notied:  11/05/2018 1850  Provider Notified: Juanda Chance MD   Orders Received/Actions taken: continue to trend levels

## 2018-11-05 NOTE — Progress Notes (Signed)
  Echocardiogram 2D Echocardiogram has been performed.  Robert [name] 11/05/2018, 10:16 AM

## 2018-11-05 NOTE — Progress Notes (Signed)
Initial Nutrition Assessment  DOCUMENTATION CODES:   Obesity unspecified  INTERVENTION:  - If patient to remain intubated >/= 24 hours, recommend initiation of TF. - Recommend Vital High Protein @ 60 ml/hr with 60 ml Prostat BID. This regimen will provides 1840 kcal, 186 grams of protein, and 1204 ml free water.    NUTRITION DIAGNOSIS:   Inadequate oral intake related to inability to eat as evidenced by NPO status.  GOAL:   Provide needs based on ASPEN/SCCM guidelines  MONITOR:   Vent status, Weight trends, Labs, I & O's, Skin  REASON FOR ASSESSMENT:   Ventilator  ASSESSMENT:    63 y.o. male with medical hx of HTN and hyperlipidemia. He presented to [facility] and diagnosed with a ruptured abdominal aortic aneurysm. He required a brief course of CPR at [facility].  He was then transferred by ambulance directly to the OR at [facility]. He was awake on arrival to the OR.  He had a known abdominal aortic aneurysm and was scheduled for follow-up in December 2019 but did not show.  Patient intubated with NGT in place and clamped. Wife is at bedside. She reports patient has a very good appetite at baseline with no chewing or swallowing issues. His last meal was last night (2/14) when they went out to eat for Valentine's Day dinner and patient ate ribs and a baked potato.   Wife denies any recent weight changes. Per chart review, current weight is 300 lb and weight on 03/14/18 was 305 lb.    Patient is currently intubated on ventilator support MV: 13.5 L/min Temp (24hrs), Avg:98.5 F (36.9 C), Min:94.6 F (34.8 C), Max:100.3 F (37.9 C) Propofol: none BP: 120/102 and MAP: 109  Medications reviewed; 40 mg IV protonix/day.  Labs reviewed; Cl: 112 mmol/l, creatinine: 1.76 mg/dl, Ca: 8.1 mg/dl. IVF; LR @ 50 ml/hr. Drips; precedex @ 0.7 mcg/kg/hr.      NUTRITION - FOCUSED PHYSICAL EXAM:  Completed; no muscle and no fat wasting; mild edema to BLE.  Diet Order:    Diet Order            Diet NPO time specified  Diet effective now              EDUCATION NEEDS:   Not appropriate for education at this time  Skin:  Skin Assessment: Skin Integrity Issues: Skin Integrity Issues:: Incisions Incisions: abdominal (2/15)  Last BM:  PTA/unknown  Height:   Ht Readings from Last 1 Encounters:  11/04/18 6\' 4"  (1.93 m)    Weight:   Wt Readings from Last 1 Encounters:  11/04/18 136.1 kg    Ideal Body Weight:  91.82 kg  BMI:  Body mass index is 36.52 kg/m.  Estimated Nutritional Needs:   Kcal:  1497-1905 kcal (11-14 kcal/kg)  Protein:  >/= 184 grams (2 grams/kg IBW)  Fluid:  >/= 2 L/day     Robert Matin, MS, RD, LDN, [facility] Inpatient Clinical Dietitian Pager # [phone] After hours/weekend pager # [phone]

## 2018-11-05 NOTE — ED Notes (Signed)
Pt was placed on code cart with defib pads and respirations assisted with BMV. Approximately 2 rounds of chest compressions given with spontaneous return of pulse and breathing. Pt became awake and alert and could follow commands. Pt was sinus tachycardic on monitor.

## 2018-11-05 NOTE — Anesthesia Preprocedure Evaluation (Signed)
Anesthesia Evaluation  Patient identified by MRN, date of birth, ID band Patient awake    Reviewed: Patient's Chart, lab work & pertinent test results, Unable to perform ROS - Chart review only  Airway Mallampati: III  TM Distance: >3 FB     Dental  (+) Teeth Intact   Pulmonary     + decreased breath sounds      Cardiovascular hypertension,  Rhythm:Regular Rate:Tachycardia     Neuro/Psych    GI/Hepatic   Endo/Other    Renal/GU      Musculoskeletal   Abdominal (+) + obese,   Peds  Hematology   Anesthesia Other Findings   Reproductive/Obstetrics                             Anesthesia Physical Anesthesia Plan  ASA: IV and emergent  Anesthesia Plan: General   Post-op Pain Management:    Induction: Intravenous  PONV Risk Score and Plan: Dexamethasone and Ondansetron  Airway Management Planned: Oral ETT  Additional Equipment: Arterial line and CVP  Intra-op Plan:   Post-operative Plan: Post-operative intubation/ventilation  Informed Consent: I have reviewed the patients History and Physical, chart, labs and discussed the procedure including the risks, benefits and alternatives for the proposed anesthesia with the patient or authorized representative who has indicated his/her understanding and acceptance.       Plan Discussed with: CRNA and Anesthesiologist  Anesthesia Plan Comments:         Anesthesia Quick Evaluation

## 2018-11-05 NOTE — ED Notes (Signed)
[facility] at bedside transferring patient

## 2018-11-05 NOTE — H&P (Signed)
Patient name: Robert [name] MRN: [id number] DOB: [birth year] Sex: male  HPI: Robert [name] is a 63 y.o. male, seen earlier this evening at [facility] med [facility].  At while he was there he was diagnosed with a ruptured abdominal aortic aneurysm.  Currently had a brief course of CPR at [facility].  He was then transferred by ambulance directly to the operating room 16 at [facility].  The patient was awake on arrival to the hospital.  He was complaining of abdominal pain.  Other medical problems include hyperlipidemia and hypertension.  She had a known abdominal aortic aneurysm and was scheduled for follow-up in December of this past year but did not show.  Past Medical History:  Diagnosis Date  . AAA (abdominal aortic aneurysm, ruptured) ([place])   . Hyperlipidemia   . Hypertension    Past Surgical History:  Procedure Laterality Date  . CARDIAC SURGERY  2006   stent placement  . HERNIA REPAIR    . INSERTION OF MESH  08/31/2012   Procedure: INSERTION OF MESH;  Surgeon: Imogene Burn. Georgette Dover, MD;  Location: [place];  Service: General;  Laterality: N/A;  . KNEE SURGERY  2009/2010   right  . KNEE SURGERY  2010   right knee partial replacement  . LEFT HEART CATHETERIZATION WITH CORONARY ANGIOGRAM N/A 05/19/2012   Procedure: LEFT HEART CATHETERIZATION WITH CORONARY ANGIOGRAM;  Surgeon: Pixie Casino, MD;  Location: [facility] CATH LAB;  Service: Cardiovascular;  Laterality: N/A;  . UMBILICAL HERNIA REPAIR  08/31/2012   Procedure: HERNIA REPAIR UMBILICAL ADULT;  Surgeon: Imogene Burn. Georgette Dover, MD;  Location: [place];  Service: General;  Laterality: N/A;  umbilical hernia repair with mesh    Family History  Problem Relation Age of Onset  . Heart disease Father   . Cancer Paternal Uncle        prostate  . Cancer Paternal Uncle        prostate    SOCIAL HISTORY: Social History   Socioeconomic History  . Marital status: Married    Spouse name: Not on file   . Number of children: Not on file  . Years of education: Not on file  . Highest education level: Not on file  Occupational History  . Not on file  Social Needs  . Financial resource strain: Not on file  . Food insecurity:    Worry: Not on file    Inability: Not on file  . Transportation needs:    Medical: Not on file    Non-medical: Not on file  Tobacco Use  . Smoking status: Never Smoker  . Smokeless tobacco: Never Used  Substance and Sexual Activity  . Alcohol use: Yes  . Drug use: No  . Sexual activity: Not on file  Lifestyle  . Physical activity:    Days per week: Not on file    Minutes per session: Not on file  . Stress: Not on file  Relationships  . Social connections:    Talks on phone: Not on file    Gets together: Not on file    Attends religious service: Not on file    Active member of club or organization: Not on file    Attends meetings of clubs or organizations: Not on file    Relationship status: Not on file  . Intimate partner violence:    Fear of current or ex partner: Not on file    Emotionally abused: Not on file  Physically abused: Not on file    Forced sexual activity: Not on file  Other Topics Concern  . Not on file  Social History Narrative  . Not on file    No Known Allergies  Current Facility-Administered Medications  Medication Dose Route Frequency Provider Last Rate Last Dose  . 0.9 %  sodium chloride infusion (Manually program via Guardrails IV Fluids)   Intravenous Once Clovis Cao, CRNA      . EPINEPHrine (ADRENALIN) 4 mg in dextrose 5 % 250 mL (0.016 mg/mL) infusion  0.5-20 mcg/min Intravenous Titrated Tamyra Fojtik, Jessy Oto, MD      . norepinephrine (LEVOPHED) 4mg  in 24mL premix infusion  0-40 mcg/min Intravenous Titrated Emmajean Ratledge, Jessy Oto, MD      . vasopressin (PITRESSIN) 40 Units in sodium chloride 0.9 % 250 mL (0.16 Units/mL) infusion  0.03 Units/min Intravenous Continuous Cisco Kindt, Jessy Oto, MD       Facility-Administered  Medications Ordered in Other Encounters  Medication Dose Route Frequency Provider Last Rate Last Dose  . 0.9 %  sodium chloride infusion    Continuous PRN Claris Che, CRNA      . calcium chloride injection    Anesthesia Intra-op Claris Che, CRNA   500 mg at 11/05/18 0520  . ceFAZolin (ANCEF) IVPB 2 g/50 mL premix   Intravenous Anesthesia Intra-op Claris Che, CRNA   2 g at 11/05/18 2297  . dexmedetomidine (PRECEDEX) 400 MCG/100ML (4 mcg/mL) infusion    Continuous PRN Claris Che, CRNA 20.4 mL/hr at 11/05/18 0627 0.6 mcg/kg/hr at 11/05/18 9892  . fentaNYL (SUBLIMAZE) injection    Anesthesia Intra-op Jearld Pies, CRNA   100 mcg at 11/05/18 1194  . furosemide (LASIX) injection    Anesthesia Intra-op Claris Che, CRNA   40 mg at 11/05/18 0329  . heparin injection    Anesthesia Intra-op Claris Che, CRNA   10,000 Units at 11/05/18 0345  . lactated ringers infusion    Continuous PRN Claris Che, CRNA      . lidocaine (cardiac) 100 mg/13mL (XYLOCAINE) injection 2%   Tracheal Tube Anesthesia Intra-op Claris Che, CRNA   60 mg at 11/05/18 0152  . midazolam (VERSED) 5 MG/5ML injection    Anesthesia Intra-op Claris Che, CRNA   2 mg at 11/05/18 0615  . phenylephrine (NEO-SYNEPHRINE) 0.04 mg/mL in sodium chloride 0.9 % 250 mL infusion    Continuous PRN Claris Che, CRNA   Stopped at 11/05/18 [phone]  . phenylephrine (NEO-SYNEPHRINE) injection    Anesthesia Intra-op Claris Che, CRNA   80 mcg at 11/05/18 0610  . rocuronium (ZEMURON) injection   Intravenous Anesthesia Intra-op Claris Che, CRNA   50 mg at 11/05/18 0600  . sodium bicarbonate injection    Anesthesia Intra-op Claris Che, CRNA   100 mEq at 11/05/18 0530  . succinylcholine (ANECTINE) injection    Anesthesia Intra-op Claris Che, CRNA   100 mg at 11/05/18 0152  . SUFentanil (SUFENTA) injection    Anesthesia Intra-op Claris Che, CRNA   10 mcg at 11/05/18 0520    ROS:   Unable to  obtain due to emergency case  Physical Examination  Vitals:   11/05/18 0009 11/05/18 0010 11/05/18 0012 11/05/18 0015  BP:   98/83 115/89  Pulse: (!) 118 (!) 120 (!) 117 (!) 117  Resp: (!) 29 (!) 29 20 (!) 29  Temp:      TempSrc:  SpO2: 100% 100% 100% 100%  Weight:      Height:        Body mass index is 36.52 kg/m.  General:  Alert and oriented, complaining of abdominal pain  HEENT: Normal Neck: No JVD Pulmonary: Clear to auscultation bilaterally Cardiac: Regular Rate and Rhythm tachycardia Abdomen: Soft, usually tender obese  Skin: No rash Extremity Pulses: No palpable pedal pulses bilaterally Musculoskeletal: No deformity or edema  Neurologic: Upper and lower extremity motor 5/5 and symmetric   CT scan abdomen pelvis was reviewed which shows a tortuous aortic neck ruptured infrarenal abdominal aortic aneurysm  ASSESSMENT: Abdominal aortic aneurysm   PLAN: Repair of ruptured abdominal aortic aneurysm   Ruta Hinds, MD [facility] Office: [phone] Pager: [phone]

## 2018-11-05 NOTE — Op Note (Signed)
Procedure: Repair of ruptured abdominal aortic aneurysm  Preoperative diagnosis: Ruptured abdominal aortic aneurysm  Postoperative diagnosis: Same  Anesthesia: General  Assistant: Leontine Locket, PA-C  Operative findings: #1 anterior rupture infrarenal abdominal aortic aneurysm  2.  Right common femoral artery 10 French sheath access for balloon occlusion  Operative details: After obtaining implied consent, the patient was taken the operating room.  Patient was placed in supine position operating table.  He was mildly unstable on arrival in the operating room.  His blood pressure was in the mid 80s his heart rate was in the 130s.  He was conscious awake and following commands.  Central line and arterial line were placed by the anesthesia team.  At this point the patient was prepped and draped from the nipples to the knees.  While he was still awake ultrasound was used to identify the right common femoral artery.  Micropuncture needle was used to cannulate this and the micropuncture wire advanced in the right iliac system.  Micropuncture sheath placed over this.  Micropuncture wire was then exchanged for an 035 Bentson wire which was threaded up into the descending thoracic aorta.  A 10 French short sheath was advanced over this into the right iliac system.  The 10 French sheath was pulled back and 2 pro glides were deployed at the 3:00 and 9 o'clock position.  A 12 French dry seal sheath was then advanced all the way up into the abdominal aorta and a MOV balloon was advanced up to around the T12-L1 vertebral body level and inflated for proximal vascular control.  At this point anesthesia induced the patient and endotracheal intubation was performed.  Next a midline laparotomy was performed extending from the xiphoid to the pubis.  There was an umbilical hernia repair with Prolene and mesh which had to be transected.  Incision was carried on through subtenons tissues down the level of fascia.  The  fascia was incised with a full length the incision.  There was a large amount of hematoma within the retroperitoneum.  The small bowel was reflected the right transverse colon was reflected superiorly.  Dissection was fairly tedious and difficult due to the patient's obesity.  Omni retractor was used for assistance in retraction.  I was able to enter the retroperitoneum and there was a fair amount of bleeding from lumbar arteries.  At this point I was able to feel the balloon in the aorta.  I was able to dissect out the aortic neck with sharp and blunt dissection.  An aortic DeBakey clamp was then placed just below the level of what was thought to be the renal arteries.  This was clamped and there was good proximal control.  Several lumbars were ligated with 2-0 silk figure-of-eight sutures.  At this point I was able to find to the left and right common iliac arteries and these were dissected free on the anterior two thirds surface and clamped with Coker clamps.  At this point we had reasonable hemostasis.  A neck was fashioned up near the proximal clamp about 2 cm below it.  An 18 x 9 mm Dacron graft was brought up in the operative field and sewn end-to-end to the proximal aorta using a running 3-0 plain suture.  2 pledgeted sutures were placed along the posterior wall where there was a slight area of puckered material.  Clamp was then removed and the anastomosis was tested.  There was some mild needle hole bleeding but overall it was fairly hemostatic.  Graft was then pulled down and cut the length of the distal anastomosis.  At this point we noticed there was some bleeding from the inferior vena cava adjacent to the distal aorta.  This was repaired with a single 3-0 Prolene pledgeted suture.  I then fashioned the neck for the distal anastomosis and the graft was cut the length and sewn end-to-end to the distal abdominal aorta using a running 3-0 Prolene suture.  Spread completion anastomosis over then was for  blood backbled and thoroughly flushed.  Estimates was secured clamps released flow was first released to the left leg followed by the right leg there was a brief pressure drop going to each leg followed by recovery.  Before we released flow to the right leg the 10 French sheath was aspirated several times and some thrombus was removed we got to clean aspirations and I pulled the sheath and secured the pro glides down to obtain hemostasis.  The nick in the right groin was closed with a Vicryl stitch.  One additional Prolene suture was placed in the anterior wall of the distal anastomosis with a pledget.  This obtained hemostasis distally.  At this point the rest of the retroperitoneum had some generalized oozing but overall was fairly hemostatic.  There is no real retroperitoneal tissue to close over the aorta so a bovine pericardial patch was brought up in the operative field and sewn over the anterior wall of the graft using a running 3-0 Vicryl suture.  The Omni retractor was then removed and the viscera were returned to their normal position.  The colon and small bowel were pink and viable.  The fascia was reapproximated using a running #1 PDS suture.  Skin was closed with staples.  Patient had a dorsalis pedis Doppler signal in the left foot and a posterior tibial Doppler signal in the right foot at the conclusion of the case.  Instrument sponge needle count was correct the end of the case.  Patient was taken to the ICU and reasonably stable but still tachycardic condition on a ventilator.  Ruta Hinds, MD [facility] Office: [phone] Pager: [phone]

## 2018-11-05 NOTE — Anesthesia Procedure Notes (Addendum)
Central Venous Catheter Insertion Performed by: Roberts Gaudy, MD Start/End2/15/2020 1:55 AM, 11/05/2018 2:00 AM Preanesthetic checklist: patient identified, IV checked, site marked, surgical consent, monitors and equipment checked, pre-op evaluation and timeout performed Position: Trendelenburg Hand hygiene performed , maximum sterile barriers used  and Seldinger technique used Catheter size: 12 Fr Total catheter length 15. Central line was placed.Triple lumen Procedure performed without using ultrasound guided technique. Attempts: 2 Following insertion, line sutured, dressing applied and Biopatch. Post procedure assessment: blood return through all ports, free fluid flow and no air  Additional procedure comments: Attempted R. IJ X 2 with ultrasound. Vein collapsed. Unable to thread wire. Decision made to proceed to L. Subclavian.[name] Kitchen

## 2018-11-06 ENCOUNTER — Inpatient Hospital Stay (HOSPITAL_COMMUNITY): Payer: 59

## 2018-11-06 DIAGNOSIS — I713 Abdominal aortic aneurysm, ruptured: Secondary | ICD-10-CM

## 2018-11-06 DIAGNOSIS — I5043 Acute on chronic combined systolic (congestive) and diastolic (congestive) heart failure: Secondary | ICD-10-CM

## 2018-11-06 LAB — CBC
HCT: 43.5 % (ref 39.0–52.0)
Hemoglobin: 14.9 g/dL (ref 13.0–17.0)
MCH: 29.6 pg (ref 26.0–34.0)
MCHC: 34.3 g/dL (ref 30.0–36.0)
MCV: 86.3 fL (ref 80.0–100.0)
Platelets: 81 10*3/uL — ABNORMAL LOW (ref 150–400)
RBC: 5.04 MIL/uL (ref 4.22–5.81)
RDW: 17.4 % — AB (ref 11.5–15.5)
WBC: 13.2 10*3/uL — ABNORMAL HIGH (ref 4.0–10.5)
nRBC: 0.3 % — ABNORMAL HIGH (ref 0.0–0.2)

## 2018-11-06 LAB — PREPARE PLATELET PHERESIS
UNIT DIVISION: 0
Unit division: 0
Unit division: 0
Unit division: 0
Unit division: 0

## 2018-11-06 LAB — URINALYSIS, ROUTINE W REFLEX MICROSCOPIC
Bilirubin Urine: NEGATIVE
GLUCOSE, UA: NEGATIVE mg/dL
Ketones, ur: NEGATIVE mg/dL
Leukocytes,Ua: NEGATIVE
Nitrite: NEGATIVE
Protein, ur: NEGATIVE mg/dL
Specific Gravity, Urine: 1.01 (ref 1.005–1.030)
pH: 5 (ref 5.0–8.0)

## 2018-11-06 LAB — COMPREHENSIVE METABOLIC PANEL
ALK PHOS: 44 U/L (ref 38–126)
ALT: 49 U/L — ABNORMAL HIGH (ref 0–44)
AST: 145 U/L — ABNORMAL HIGH (ref 15–41)
Albumin: 2.8 g/dL — ABNORMAL LOW (ref 3.5–5.0)
Anion gap: 11 (ref 5–15)
BUN: 40 mg/dL — ABNORMAL HIGH (ref 8–23)
CO2: 24 mmol/L (ref 22–32)
Calcium: 7.6 mg/dL — ABNORMAL LOW (ref 8.9–10.3)
Chloride: 110 mmol/L (ref 98–111)
Creatinine, Ser: 4.59 mg/dL — ABNORMAL HIGH (ref 0.61–1.24)
GFR calc Af Amer: 15 mL/min — ABNORMAL LOW (ref >60)
GFR calc non Af Amer: 13 mL/min — ABNORMAL LOW (ref >60)
Glucose, Bld: 144 mg/dL — ABNORMAL HIGH (ref 70–99)
Potassium: 5.1 mmol/L (ref 3.5–5.1)
Sodium: 145 mmol/L (ref 135–145)
TOTAL PROTEIN: 5.3 g/dL — AB (ref 6.5–8.1)
Total Bilirubin: 0.7 mg/dL (ref 0.3–1.2)

## 2018-11-06 LAB — BLOOD GAS, ARTERIAL
Acid-base deficit: 1.2 mmol/L (ref 0.0–2.0)
Bicarbonate: 22.6 mmol/L (ref 20.0–28.0)
Drawn by: 511911
FIO2: 50
MECHVT: 690 mL
O2 Saturation: 95.1 %
PEEP: 10 cmH2O
Patient temperature: 98.6
RATE: 20 resp/min
pCO2 arterial: 35.4 mmHg (ref 32.0–48.0)
pH, Arterial: 7.422 (ref 7.350–7.450)
pO2, Arterial: 79.8 mmHg — ABNORMAL LOW (ref 83.0–108.0)

## 2018-11-06 LAB — POCT I-STAT 7, (LYTES, BLD GAS, ICA,H+H)
Acid-base deficit: 2 mmol/L (ref 0.0–2.0)
Acid-base deficit: 2 mmol/L (ref 0.0–2.0)
Bicarbonate: 21.7 mmol/L (ref 20.0–28.0)
Bicarbonate: 21.6 mmol/L (ref 20.0–28.0)
CALCIUM ION: 1.04 mmol/L — AB (ref 1.15–1.40)
Calcium, Ion: 1.03 mmol/L — ABNORMAL LOW (ref 1.15–1.40)
HCT: 41 % (ref 39.0–52.0)
HCT: 39 % (ref 39.0–52.0)
Hemoglobin: 13.9 g/dL (ref 13.0–17.0)
Hemoglobin: 13.3 g/dL (ref 13.0–17.0)
O2 Saturation: 93 %
O2 Saturation: 93 %
POTASSIUM: 5.1 mmol/L (ref 3.5–5.1)
Patient temperature: 35.8
Patient temperature: 96.3
Potassium: 4.5 mmol/L (ref 3.5–5.1)
Sodium: 144 mmol/L (ref 135–145)
Sodium: 143 mmol/L (ref 135–145)
TCO2: 23 mmol/L (ref 22–32)
TCO2: 23 mmol/L (ref 22–32)
pCO2 arterial: 33 mmHg (ref 32.0–48.0)
pCO2 arterial: 31 mmHg — ABNORMAL LOW (ref 32.0–48.0)
pH, Arterial: 7.421 (ref 7.350–7.450)
pH, Arterial: 7.447 (ref 7.350–7.450)
pO2, Arterial: 62 mmHg — ABNORMAL LOW (ref 83.0–108.0)
pO2, Arterial: 60 mmHg — ABNORMAL LOW (ref 83.0–108.0)

## 2018-11-06 LAB — BPAM PLATELET PHERESIS
BLOOD PRODUCT EXPIRATION DATE: 202002172359
Blood Product Expiration Date: 202002152359
Blood Product Expiration Date: 202002152359
Blood Product Expiration Date: 202002162359
Blood Product Expiration Date: 202002162359
ISSUE DATE / TIME: 202002150142
ISSUE DATE / TIME: 202002150142
ISSUE DATE / TIME: 202002150405
ISSUE DATE / TIME: 202002150405
ISSUE DATE / TIME: 202002151114
UNIT TYPE AND RH: 9500
UNIT TYPE AND RH: 7300
Unit Type and Rh: 5100
Unit Type and Rh: 2800
Unit Type and Rh: 6200

## 2018-11-06 LAB — MRSA PCR SCREENING: MRSA by PCR: NEGATIVE

## 2018-11-06 LAB — TROPONIN I: Troponin I: 1 ng/mL (ref <0.03)

## 2018-11-06 LAB — AMYLASE: Amylase: 193 U/L — ABNORMAL HIGH (ref 28–100)

## 2018-11-06 LAB — MAGNESIUM: Magnesium: 2.1 mg/dL (ref 1.7–2.4)

## 2018-11-06 LAB — CREATININE, URINE, RANDOM: Creatinine, Urine: 86.02 mg/dL

## 2018-11-06 LAB — NA AND K (SODIUM & POTASSIUM), RAND UR
POTASSIUM UR: 42 mmol/L
Sodium, Ur: 101 mmol/L

## 2018-11-06 LAB — LACTIC ACID, PLASMA
Lactic Acid, Venous: 4.3 mmol/L (ref 0.5–1.9)
Lactic Acid, Venous: 2.7 mmol/L (ref 0.5–1.9)

## 2018-11-06 MED ORDER — FENTANYL BOLUS VIA INFUSION
50.0000 ug | INTRAVENOUS | Status: DC | PRN
  Filled 2018-11-06: qty 50

## 2018-11-06 MED ORDER — FENTANYL CITRATE (PF) 100 MCG/2ML IJ SOLN: 50.0000 ug | Freq: Once | INTRAMUSCULAR | Status: DC

## 2018-11-06 MED ORDER — FUROSEMIDE 10 MG/ML IJ SOLN
160.0000 mg | Freq: Once | INTRAVENOUS | Status: AC
  Administered 2018-11-06: 160 mg via INTRAVENOUS
  Filled 2018-11-06: qty 10

## 2018-11-06 MED ORDER — FENTANYL 2500MCG IN NS 250ML (10MCG/ML) PREMIX INFUSION: 25.0000 ug/h | INTRAVENOUS | Status: DC

## 2018-11-06 NOTE — Progress Notes (Signed)
Pt. Still oliguric, bladder scan showed 66mL. Last CVP 20, intra-abdominal pressure 21. Detterding MD notified. No new orders at this time.

## 2018-11-06 NOTE — Progress Notes (Signed)
Bilateral renal artery duplex exam completed. Limited study due to patient's condition. More details please see preliminary notes on CV PROC under chart review.  Jarelyn Bambach H Emarie Paul(RDMS RVT) 11/06/18 12:23 PM

## 2018-11-06 NOTE — Consult Note (Addendum)
Renal Service Consult Note [facility]  Robert [name] 11/06/2018 Robert [name] Requesting Physician:  Dr Oneida Alar  Reason for Consult:  AKI after AAA rupture and repair HPI: The patient is a 63 y.o. year-old with hx of HTN, HL, CAD w/ stent who presented on 2/14 to outside ED w/ abd pain and ruptured AAA.  He underwent some CPR there and was transferred directly to [place] where he underwent repair of anterior infrarenal AAA.  Postop has AKI. ECHO showed EF 25%.  Seen by cards thinking low EF may be due to hypoperfusion /shock of cardiac arrest rather than to acute MI.  Started on IV lasix and is making urine now today 100 cc/hr, was oliguric prior. CXR today showing LLL consolidation, no edema. CVP around 16.   Patient unable to give any history.  Last creat is from 2013 at 1.06.  Admit creat here was 1.38.    ROS  n/a   Past Medical History  Past Medical History:  Diagnosis Date  . AAA (abdominal aortic aneurysm, ruptured) ([place])   . Fibromuscular dysplasia of renal artery (H[facility])    s/p angioplasty to both renal arteries in 2006  . History of cardiac catheterization    LHC in 2013:  no sig CAD  . Hyperlipidemia   . Hypertension    Past Surgical History  Past Surgical History:  Procedure Laterality Date  . CARDIAC SURGERY  2006   stent placement  . HERNIA REPAIR    . INSERTION OF MESH  08/31/2012   Procedure: INSERTION OF MESH;  Surgeon: Imogene Burn. Georgette Dover, MD;  Location: [place];  Service: General;  Laterality: N/A;  . KNEE SURGERY  2009/2010   right  . KNEE SURGERY  2010   right knee partial replacement  . LEFT HEART CATHETERIZATION WITH CORONARY ANGIOGRAM N/A 05/19/2012   Procedure: LEFT HEART CATHETERIZATION WITH CORONARY ANGIOGRAM;  Surgeon: Pixie Casino, MD;  Location: [facility] CATH LAB;  Service: Cardiovascular;  Laterality: N/A;  . UMBILICAL HERNIA REPAIR  08/31/2012   Procedure: HERNIA REPAIR UMBILICAL ADULT;  Surgeon: Imogene Burn.  Georgette Dover, MD;  Location: [place];  Service: General;  Laterality: N/A;  umbilical hernia repair with mesh   Family History  Family History  Problem Relation Age of Onset  . Heart disease Father   . Cancer Paternal Uncle        prostate  . Cancer Paternal Uncle        prostate   Social History  reports that he has never smoked. He has never used smokeless tobacco. He reports current alcohol use. He reports current drug use. Drug: Marijuana. Allergies No Known Allergies Home medications Prior to Admission medications   Medication Sig Start Date End Date Taking? Authorizing Provider  aspirin EC 325 MG tablet Take 325 mg by mouth daily.    [provider]  benazepril-hydrochlorthiazide (LOTENSIN HCT) 20-12.5 MG per tablet Take 2 tablets by mouth daily. Takes 2 pills at once    [provider]  NIFEdipine (ADALAT CC) 90 MG 24 hr tablet Take 90 mg by mouth daily.    [provider]  rosuvastatin (CRESTOR) 20 MG tablet Take 20 mg by mouth daily.    [provider]  sildenafil (VIAGRA) 100 MG tablet  10/13/16   [provider]   Liver Function Tests Recent Labs  Lab 11/05/18 0829 11/06/18 0430  AST 61* 145*  ALT 25 49*  ALKPHOS 38 44  BILITOT 2.0* 0.7  PROT 4.2* 5.3*  ALBUMIN 2.5* 2.8*   Recent Labs  Lab 11/06/18 0430  AMYLASE 193*   CBC Recent Labs  Lab 11/04/18 2330  11/05/18 0737 11/05/18 0829  11/06/18 0430 11/06/18 0448 11/06/18 1232  WBC 8.7  --  12.9* 12.2*  --  13.2*  --   --   NEUTROABS 5.5  --   --  9.1*  --   --   --   --   HGB 15.5   < > 16.3 16.1   < > 14.9 13.9 13.3  HCT 48.7   < > 46.4 47.1   < > 43.5 41.0 39.0  MCV 98.0  --  86.1 85.2  --  86.3  --   --   PLT PLATELET CLUMPS NOTED ON SMEAR, COUNT APPEARS ADEQUATE   < > 95* 102*  --  81*  --   --    < > = values in this interval not displayed.   Basic Metabolic Panel Recent Labs  Lab 11/04/18 2330  11/05/18 0710 11/05/18 0737 11/05/18 0829  11/05/18 1207 11/06/18 0430 11/06/18 0448 11/06/18 1232  NA 138   < > 143 145 144 144 145 144 143  K 5.7*   < > 5.2* 5.2* 5.1 4.7 5.1 5.1 4.5  CL 106  --   --  112* 112* 111 110  --   --   CO2 21*  --   --  22 21* 22 24  --   --   GLUCOSE 143*  --   --  123* 160* 146* 144*  --   --   BUN 14  --   --  16 18 21  40*  --   --   CREATININE 1.38*  --   --  1.54* 1.76* 2.32* 4.59*  --   --   CALCIUM 8.5*  --   --  8.3* 8.1* 7.8* 7.6*  --   --    < > = values in this interval not displayed.   Iron/TIBC/Ferritin/ %Sat No results found for: IRON, TIBC, FERRITIN, IRONPCTSAT  Vitals:   11/06/18 1800 11/06/18 1900 11/06/18 1943 11/06/18 2000  BP: 122/82 135/81 (!) 161/82 (!) 138/91  Pulse: 80 84 83 83  Resp: 20 (!) 21 (!) 22 (!) 21  Temp:      TempSrc:      SpO2: 90% 91% 94% 92%  Weight:      Height:       Exam Gen intubated obese AAM , responds to simple questions No rash, cyanosis or gangrene Sclera anicteric, throat w ETT No jvd or bruits Chest clear bilat to bases RRR no MRG Abd soft ntnd no mass or ascites +bs, very obese and protuberant, large dressing midline GU normal male w foley passing clear yellow urine MS no joint effusions or deformity Ext 1+ diffuse bilat LE edema feet to hips, no wounds or ulcers Neuro as above    Home meds:  - aspirin 325/ rosuvastatin 20 qd  - benazepril-HCT 20-12.5 take 2 pills qd/ nifedipine 900 qd    UA 2/16 - neg protein, 20-50 rbc/ 11-20 wbc, rare bact, 0-5 epis  UNa 101/ Ucreat 86  UOP 800cc - 300cc - 800 cc the last 3 days including today  I/O total = 28L in and 17 L out  ECHO reduced LVEF 20-25%, reduced RV function  Renal US >> 11.5- 12.5 cm kidneys, no hydro  CXR - no edema, possible LLL  infiltrate  Assessment: 1. AKI - due to shock/ arrest / CPR.  Was oliguric now making urine on IV lasix.  Cont supportive care. No indication RRT yet.  Will watch closely, will need CRRT if RRT required.   2. AAA rupture- sp repair  2/15 3. Cardiac arrest (in ED) 4. CM EF 20-25% -by echo, cards following. IV lasix running.  5. H/o HTN - BP's labile now 6. VDRF  P: 1. AS above.       [facility] 11/06/2018, 8:52 PM

## 2018-11-06 NOTE — Progress Notes (Signed)
NAME:  Robert [name], MRN:  [id number], DOB:  10-04-1955, LOS: 1 ADMISSION DATE:  11/04/2018, CONSULTATION DATE:  2.15.2020 REFERRING MD:  Oneida Alar, MD CHIEF COMPLAINT:  Post-operative AAA-ruptured  Brief History   Evaluated in CV ICU post exploratory lap for ruptured AAA. Significant amount of blood loss estimated at 1400 cc. Received multiple units of blood, platelets and FFP. Intubated with glidescope without difficulty. Hypotensive initially, now off pressors. Given paralyzing agent and sedation prior to arrival to unit. Had left subclavian CVC and right radial arterial catheter placed.  No vascular reanastamosis. Had graft placed infrarenal. Bowel appeared "purple" but improved at end of case. No bowel perforation or enterotomy.  Has history of AAA and renal artery stenosis requiring angioplasty.  Found to have ECG changes post-operatively this morning and stat echocardiogram shows global reduction in EF of 20%  History of present illness    63 y.o.male,presented to [facility] med center with abdominal pain. Pain became severe was there he was diagnosed with a ruptured abdominal aortic aneurysm. Became syncopal and pulseless requiring a  brief course of CPR at [facility].Was not intubated at the time.  Transferred by EMS directly to the operating room at [facility]. The patient was awake on arrival to the hospital and complaining of abdominal pain.  Known abdominal aortic aneurysm and scheduled for follow-up in December 2019 but did not show up.  Interim history/subjective:  Patient had post-operative ECG changes. Echo shows globally reduced function.  Poor UOP with elevated CVP. No worsening respiratory failure. Started on PSV wean Following commands No hemodynamic lability Abdominal pressures 17-20 but no elevation in ventilator pressures Wife discloses that patient snores and has apneic episodes when sleeping ECG personally reviewed. ST improved in V2-3, T waves inverted  lateral and inferior  Past Medical History   Past Medical History:  Diagnosis Date  . AAA (abdominal aortic aneurysm, ruptured) ([place])   . Fibromuscular dysplasia of renal artery (HCC)    s/p angioplasty to both renal arteries in 2006  . History of cardiac catheterization    LHC in 2013:  no sig CAD  . Hyperlipidemia   . Hypertension    Past Surgical History:  Procedure Laterality Date  . CARDIAC SURGERY  2006   stent placement  . HERNIA REPAIR    . INSERTION OF MESH  08/31/2012   Procedure: INSERTION OF MESH;  Surgeon: Imogene Burn. Georgette Dover, MD;  Location: [place];  Service: General;  Laterality: N/A;  . KNEE SURGERY  2009/2010   right  . KNEE SURGERY  2010   right knee partial replacement  . LEFT HEART CATHETERIZATION WITH CORONARY ANGIOGRAM N/A 05/19/2012   Procedure: LEFT HEART CATHETERIZATION WITH CORONARY ANGIOGRAM;  Surgeon: Pixie Casino, MD;  Location: [facility] CATH LAB;  Service: Cardiovascular;  Laterality: N/A;  . UMBILICAL HERNIA REPAIR  08/31/2012   Procedure: HERNIA REPAIR UMBILICAL ADULT;  Surgeon: Imogene Burn. Georgette Dover, MD;  Location: [place];  Service: General;  Laterality: N/A;  umbilical hernia repair with mesh     Significant Hospital Events   Acute renal failure Massive transfusion  Consults:  [facility] Cardiology  Procedures:  Repair of AAA rupture: 2.15.2020 Placement of CVC: 2.14.2020 Placement of Arterial Cannula: 2.14.2020 Intubation for surgery: 2.14.2020  Significant Diagnostic Tests:  2.15.2020: Renal US--no significant findings  CT-scan of abdomen and pelvis Echo: IMPRESSIONS   1. The left ventricle has severely reduced systolic function, with an ejection fraction of 20-25%. The cavity size  was normal. There is moderately increased left ventricular wall thickness. Left ventricular diastolic Doppler parameters are consistent  with impaired relaxation Left ventricular diffuse hypokinesis. 2. The right ventricle  has moderately reduced systolic function. The cavity was mildly enlarged. There is mildly increased right ventricular wall thickness. 3. The mitral valve is normal in structure. No evidence of mitral valve stenosis. No significant regurgitation. 4. The tricuspid valve is normal in structure. 5. The aortic valve is tricuspid no stenosis of the aortic valve. 6. The pulmonic valve was normal in structure. 7. The aortic root and ascending aorta are normal in size and structure. 8. No complete TR doppler jet so unable to estimate PA systolic pressure.  FINDINGS Left Ventricle: The left ventricle has severely reduced systolic function, with an ejection fraction of 20-25%. The cavity size was normal. There is moderately increased left ventricular wall thickness. Left ventricular diastolic Doppler parameters are  consistent with impaired relaxation Left ventricular diffuse hypokinesis. Right Ventricle: The right ventricle has moderately reduced systolic function. The cavity was mildly enlarged. There is mildly increased right ventricular wall thickness. Left Atrium: left atrial size was normal in size Right Atrium: right atrial size was normal in size Right atrial pressure is estimated at 0 mmHg. Interatrial Septum: No atrial level shunt detected by color flow Doppler. Pericardium: There is no evidence of pericardial effusion. Mitral Valve: The mitral valve is normal in structure. Mitral valve regurgitation is not visualized by color flow Doppler. No evidence of mitral valve stenosis. Tricuspid Valve: The tricuspid valve is normal in structure. Tricuspid valve regurgitation was not visualized by color flow Doppler. Aortic Valve: The aortic valve is tricuspid Aortic valve regurgitation was not visualized by color flow Doppler. There is no stenosis of the aortic valve. Pulmonic Valve: The pulmonic valve was normal in structure. Pulmonic valve regurgitation is not visualized by color flow  Doppler. Aorta: The aortic root and ascending aorta are normal in size and structure. Venous: The inferior vena cava was not well visualized.  LEFT VENTRICLE PLAX 2D (Teich) LV EF: 44.8 % Diastology LVIDd: 4.10 cm LV e' lateral: 4.38 cm/s LVIDs: 3.20 cm LV E/e' lateral: 7.4 LV PW: 1.10 cm LV e' medial: 3.57 cm/s LV IVS: 1.10 cm LV E/e' medial: 9.1 LVOT diam: 2.50 cm LV SV: 33 ml LVOT Area: 4.91 cm  RIGHT VENTRICLE RV S prime: 5.35 cm/s TAPSE (M-mode): 0.8 cm  LEFT ATRIUM Index RIGHT ATRIUM Index LA diam: 3.80 cm 1.44 cm/m RA Pressure: 0 mmHg LA Vol (A2C): 34.6 ml 13.15 ml/m RA Area: 13.80 cm LA Vol (A4C): 42.8 ml 16.26 ml/m RA Volume: 31.70 ml 12.04 ml/m LA Biplane Vol: 39.4 ml 14.97 ml/m  AORTA Ao Root diam: 3.60 cm  MV E velocity: 32.50 cm/s MV A velocity: 69.40 cm/s MV E/A ratio: 0.47   Antimicrobials:  Post surgical   Objective   Blood pressure 116/82, pulse 74, temperature (!) 96.3 F (35.7 C), resp. rate 20, height 6\' 4"  (1.93 m), weight 136.1 kg, SpO2 93 %. CVP:  [11 mmHg-20 mmHg] 16 mmHg  Vent Mode: CPAP;PSV FiO2 (%):  [50 %] 50 % Set Rate:  [20 bmp] 20 bmp Vt Set:  [690 mL] 690 mL PEEP:  [5 cmH20-10 cmH20] 5 cmH20 Pressure Support:  [5 cmH20] 5 cmH20 Plateau Pressure:  [20 cmH20-25 cmH20] 22 cmH20   Intake/Output Summary (Last 24 hours) at 11/06/2018 1057 Last data filed at 11/06/2018 1000 Gross per 24 hour  Intake 4648.02 ml  Output 980 ml  Net 3668.02 ml   Filed Weights   11/04/18 2311  Weight: 136.1 kg   Examination: General: Intubated, obese, non-toxic, responsive. Lifts head off of bed HENT: Left subclavian CVC clean, non-erythematous. Intact. Mucous membranes dry, PEARL, no icterus or nystagmus Lungs: Clear, minimal rhonchi Cardiovascular: R3, S1S2. Vitals reviewed. No murmur, no gallop Abdomen: Minimal  distension, soft, minimal tenderness, incision clean and dry. Bowel sounds hypoactive-nil Extremities: mild edema. Some furrowing of skin on left foot. Anasarca upper and lower. No swelling or erythema Neuro: GCS: 4-3i-6. Attempts to speak and answer questions. Lifts head, no focal deficits GU: Foley intact and in-place. Necessary for acute renal failure  Resolved Hospital Problem list    Assessment & Plan:  1. Hemorrhagic shock-resolved 2. Ruptured AAA-s/p repair 3. Acute renal failure-worsening 4. Lactic acidosis-improved 5. Significant blood product need 6. STEMI-cardiology following 7. Cardiomyopathy, concern for global dysfunction related to hemorrhagic shock 8. Post-cardiac arrest-neurologic exam intact 9. Encounter for ventilator wean-on-going 10. Intubated with glidescope without difficulty.   Plans: 1. Nephrology consult. Will await liberation from mechanical ventilation until determination for dialysis catheter is made 2. Urine electrolytes 3. Await UA and UDS 4. NPO until cleared by surgery 5. Hold IVF 6. Adjust medications for GFR. Have discontinued Morphine. Use dilaudid or fentanyl for pain 7. Follow weaning parameters. 8. Repeat troponin 9. Repeat lactic acid  Best practice:  Diet: NPO per surgery Pain/Anxiety/Delirium protocol (if indicated): analgesia as noted above VAP protocol (if indicated): Yes DVT prophylaxis: Chemical DVT On hold per surgery. SCD's GI prophylaxis: Protonix Glucose control: Following Mobility: On ventilator wean Code Status: Full Family Communication: At bedside with vascular surgery team Disposition: Remain in CVICU  Labs   CBC: Recent Labs  Lab 11/04/18 2330  11/05/18 0312  11/05/18 0446  11/05/18 0737 11/05/18 0829 11/05/18 1207 11/06/18 0430 11/06/18 0448  WBC 8.7  --   --   --   --   --  12.9* 12.2*  --  13.2*  --   NEUTROABS 5.5  --   --   --   --   --   --  9.1*  --   --   --   HGB 15.5   < >  --    < >  --    < >  16.3 16.1 16.1 14.9 13.9  HCT 48.7   < >  --    < >  --    < > 46.4 47.1 47.0 43.5 41.0  MCV 98.0  --   --   --   --   --  86.1 85.2  --  86.3  --   PLT PLATELET CLUMPS NOTED ON SMEAR, COUNT APPEARS ADEQUATE  --  96*  --  128*  --  95* 102*  --  81*  --    < > = values in this interval not displayed.    Basic Metabolic Panel: Recent Labs  Lab 11/04/18 2330  11/05/18 0737 11/05/18 0829 11/05/18 1207 11/06/18 0430 11/06/18 0448  NA 138   < > 145 144 144 145 144  K 5.7*   < > 5.2* 5.1 4.7 5.1 5.1  CL 106  --  112* 112* 111 110  --   CO2 21*  --  22 21* 22 24  --   GLUCOSE 143*  --  123* 160* 146* 144*  --   BUN 14  --  16 18 21  40*  --   CREATININE 1.38*  --  1.54* 1.76* 2.32* 4.59*  --   CALCIUM 8.5*  --  8.3* 8.1* 7.8* 7.6*  --   MG  --   --  1.6*  --   --  2.1  --    < > = values in this interval not displayed.   GFR: Estimated Creatinine Clearance: 25.5 mL/min (A) (by C-G formula based on SCr of 4.59 mg/dL (H)). Recent Labs  Lab 11/04/18 2330  11/05/18 0737 11/05/18 0829 11/05/18 1207 11/05/18 1446 11/05/18 1814 11/05/18 2005 11/06/18 0430  WBC 8.7  --  12.9* 12.2*  --   --   --   --  13.2*  LATICACIDVEN  --    < > 5.5* 4.2* 4.0* 3.9* 4.1* 4.3*  --    < > = values in this interval not displayed.    Liver Function Tests: Recent Labs  Lab 11/05/18 0829 11/06/18 0430  AST 61* 145*  ALT 25 49*  ALKPHOS 38 44  BILITOT 2.0* 0.7  PROT 4.2* 5.3*  ALBUMIN 2.5* 2.8*   Recent Labs  Lab 11/06/18 0430  AMYLASE 193*   Recent Labs  Lab 11/05/18 0834  AMMONIA 39*    ABG    Component Value Date/Time   PHART 7.421 11/06/2018 0448   PCO2ART 33.0 11/06/2018 0448   PO2ART 62.0 (L) 11/06/2018 0448   HCO3 21.7 11/06/2018 0448   TCO2 23 11/06/2018 0448   ACIDBASEDEF 2.0 11/06/2018 0448   O2SAT 93.0 11/06/2018 0448     Coagulation Profile: Recent Labs  Lab 11/05/18 0312 11/05/18 0446 11/05/18 0829  INR 1.57 1.48 1.43    Cardiac Enzymes: Recent Labs    Lab 11/04/18 2330 11/05/18 0829 11/05/18 1446 11/05/18 2005  TROPONINI 0.04* 2.02* 5.71* 5.23*    HbA1C: No results found for: HGBA1C  CBG: No results for input(s): GLUCAP in the last 168 hours.  Review of Systems:   No formal diagnosis of sleep apnea but wife affirms snoring and apnea      Social History   reports that he has never smoked. He has never used smokeless tobacco. He reports current alcohol use. He reports current drug use. Drug: Marijuana.   Family History   His family history includes Cancer in his paternal uncle and paternal uncle; Heart disease in his father.   Allergies No Known Allergies   Home Medications  Prior to Admission medications   Medication Sig Start Date End Date Taking? Authorizing Provider  aspirin EC 325 MG tablet Take 325 mg by mouth daily.    [provider]  benazepril-hydrochlorthiazide (LOTENSIN HCT) 20-12.5 MG per tablet Take 2 tablets by mouth daily. Takes 2 pills at once    [provider]  NIFEdipine (ADALAT CC) 90 MG 24 hr tablet Take 90 mg by mouth daily.    [provider]  rosuvastatin (CRESTOR) 20 MG tablet Take 20 mg by mouth daily.    [provider]  sildenafil (VIAGRA) 100 MG tablet  10/13/16   [provider]     Critical care time: 60 minutes

## 2018-11-06 NOTE — Progress Notes (Signed)
Patient ID: Robert [name], male   DOB: 18-Dec-1955, 63 y.o.   MRN: [id number]      PCP-Cardiologist: No primary care provider on file.   Subjective:    Patient remains intubated.  He awakens and can follow commands.   UOP 325 cc yesterday despite Lasix 80 mg IV and IV fluid boluses.  CVP 16-18 today.  Creatinine 1.38 => 4.25.    Troponin peaked at 5.71 => 5.23.  ECG showed resolution in concave up STE and inferior TWIs by yesterday evening. ECG this morning with no STE or Qs, nonspecific anterolateral TWIs.   Echo: EF 20-25%, diffuse hypokinesis, moderate LVH, moderately decreased RV systolic function with mild dilation.    Objective:   Weight Range: 136.1 kg Body mass index is 36.52 kg/m.   Vital Signs:   Temp:  [96.3 F (35.7 C)-100.3 F (37.9 C)] 96.3 F (35.7 C) (02/16 0800) Pulse Rate:  [72-93] 72 (02/16 0900) Resp:  [20-26] 20 (02/16 0900) BP: (96-151)/(69-130) 108/81 (02/16 0900) SpO2:  [92 %-100 %] 93 % (02/16 0900) Arterial Line BP: (89-159)/(55-96) 116/68 (02/16 0900) FiO2 (%):  [50 %] 50 % (02/16 0850) Last BM Date: (PTA)  Weight change: Filed Weights   11/04/18 2311  Weight: 136.1 kg    Intake/Output:   Intake/Output Summary (Last 24 hours) at 11/06/2018 0932 Last data filed at 11/06/2018 0900 Gross per 24 hour  Intake 4628.46 ml  Output 940 ml  Net 3688.46 ml      Physical Exam    General:  Intubated but awakens.  HEENT: Normal Neck: Supple. JVP difficult but appears elevated. Carotids 2+ bilat.  No lymphadenopathy or thyromegaly appreciated. Cor: PMI nondisplaced. Regular rate & rhythm. No rubs, gallops or murmurs. Lungs: Clear Abdomen: Soft, nontender, Moderately distended. No hepatosplenomegaly. No bruits or masses. Good bowel sounds. Extremities: No cyanosis, clubbing, rash, edema Neuro: Follows commands.    Telemetry   NSR 70s (personally reviewed)  EKG    NSR, no STE or Qs, nonspecific anterolateral TWIs.   Labs     CBC Recent Labs    11/04/18 2330  11/05/18 0829  11/06/18 0430 11/06/18 0448  WBC 8.7   < > 12.2*  --  13.2*  --   NEUTROABS 5.5  --  9.1*  --   --   --   HGB 15.5   < > 16.1   < > 14.9 13.9  HCT 48.7   < > 47.1   < > 43.5 41.0  MCV 98.0   < > 85.2  --  86.3  --   PLT PLATELET CLUMPS NOTED ON SMEAR, COUNT APPEARS ADEQUATE   < > 102*  --  81*  --    < > = values in this interval not displayed.   Basic Metabolic Panel Recent Labs    11/05/18 0737  11/05/18 1207 11/06/18 0430 11/06/18 0448  NA 145   < > 144 145 144  K 5.2*   < > 4.7 5.1 5.1  CL 112*   < > 111 110  --   CO2 22   < > 22 24  --   GLUCOSE 123*   < > 146* 144*  --   BUN 16   < > 21 40*  --   CREATININE 1.54*   < > 2.32* 4.59*  --   CALCIUM 8.3*   < > 7.8* 7.6*  --   MG 1.6*  --   --  2.1  --    < > =  values in this interval not displayed.   Liver Function Tests Recent Labs    11/05/18 0829 11/06/18 0430  AST 61* 145*  ALT 25 49*  ALKPHOS 38 44  BILITOT 2.0* 0.7  PROT 4.2* 5.3*  ALBUMIN 2.5* 2.8*   Recent Labs    11/06/18 0430  AMYLASE 193*   Cardiac Enzymes Recent Labs    11/05/18 0829 11/05/18 1446 11/05/18 2005  TROPONINI 2.02* 5.71* 5.23*    BNP: BNP (last 3 results) No results for input(s): BNP in the last 8760 hours.  ProBNP (last 3 results) No results for input(s): PROBNP in the last 8760 hours.   D-Dimer Recent Labs    11/05/18 0312 11/05/18 0446  DDIMER >20.00* >20.00*   Hemoglobin A1C No results for input(s): HGBA1C in the last 72 hours. Fasting Lipid Panel No results for input(s): CHOL, HDL, LDLCALC, TRIG, CHOLHDL, LDLDIRECT in the last 72 hours. Thyroid Function Tests Recent Labs    11/05/18 0829  TSH 1.585    Other results:   Imaging    US Renal  Result Date: 11/05/2018 CLINICAL DATA:  Acute renal failure, obesity. EXAM: RENAL / URINARY TRACT ULTRASOUND COMPLETE COMPARISON:  None. FINDINGS: Right Kidney: Renal measurements: 12.5 x 5.2 x 5.6 cm =  volume: 198 mL . Echogenicity within normal limits. No mass or hydronephrosis visualized. Left Kidney: Renal measurements: 11.5 x 6.3 x 5.7 cm = volume: 218 mL. Echogenicity within normal limits. No mass or hydronephrosis visualized. Suboptimal visualization due to nearby surgical changes/gauze. Bladder: Appears normal for degree of bladder distention. IMPRESSION: Normal bilateral renal ultrasound, with mild study limitations detailed above. Electronically Signed   By: Franki Cabot M.D.   On: 11/05/2018 20:22   Dg Chest Port 1 View  Result Date: 11/06/2018 CLINICAL DATA:  Postop day 1 ruptured abdominal aortic aneurysm repair. Ventilator dependent. EXAM: PORTABLE CHEST 1 VIEW COMPARISON:  11/05/2018 and earlier. FINDINGS: Endotracheal tube tip in satisfactory position approximately 4 cm above the carina. Nasogastric tube courses below the diaphragm into the stomach. External pacing pads. Suboptimal inspiration. Consolidation in the LEFT LOWER LOBE, unchanged since yesterday. Lungs remain clear otherwise. Pulmonary vascularity normal. Cardiac silhouette upper normal in size. IMPRESSION: 1.  Support apparatus satisfactory. 2. Stable dense LEFT LOWER LOBE atelectasis (favored over pneumonia). 3. No new abnormalities. Electronically Signed   By: Evangeline Dakin M.D.   On: 11/06/2018 07:44   Dg Chest Port 1 View  Result Date: 11/05/2018 CLINICAL DATA:  Evaluate ETT. EXAM: PORTABLE CHEST 1 VIEW COMPARISON:  November 05, 2018 FINDINGS: The ETT terminates 5.5 cm above the carina and 4 cm below the thoracic inlet in good position. An NG tube terminates below today's film. A left central line terminates in the SVC. No pneumothorax. Atelectasis and possibly a tiny effusion in the left base. The cardiomediastinal silhouette is stable. No other changes. A transcutaneous pacer lead partially obscures the left chest. IMPRESSION: 1. The ETT is in good position.  Other support apparatus as above. 2. Mild atelectasis and  possible tiny associated effusion in the left base. Electronically Signed   By: Dorise Bullion III M.D   On: 11/05/2018 19:51      Medications:     Scheduled Medications: . sodium chloride   Intravenous Once  . aspirin  81 mg Oral Daily  . atorvastatin  80 mg Oral q1800  . chlorhexidine gluconate (MEDLINE KIT)  15 mL Mouth Rinse BID  . Chlorhexidine Gluconate Cloth  6 each Topical Daily  .  mouth rinse  15 mL Mouth Rinse 10 times per day  . pantoprazole (PROTONIX) IV  40 mg Intravenous QHS  . sodium chloride flush  10-40 mL Intracatheter Q12H     Infusions: . sodium chloride    . dexmedetomidine 0.9 mcg/kg/hr (11/06/18 0900)  . epinephrine    . furosemide    . lactated ringers 10 mL/hr at 11/06/18 0900  . norepinephrine (LEVOPHED) Adult infusion    . vasopressin (PITRESSIN) infusion - *FOR SHOCK*       PRN Medications:  sodium chloride, acetaminophen **OR** acetaminophen, bisacodyl, hydrALAZINE, labetalol, metoprolol tartrate, morphine injection, ondansetron, phenol, potassium chloride, sodium chloride flush     Assessment/Plan   1. AAA rupture: S/p operative repair 2/15.  2. Cardiac arrest: With CPR in ER at [facility] prior to transfer for AAA repair.  3. Acute systolic CHF: Echo with EF 20-25%, diffuse hypokinesis with moderate LVH, moderately decreased RV systolic function.  Troponin peaked at 5.71, ECG with transient concave up anterior STE yesterday, now resolved.  Creatinine now up to 4.59.  We did not take him to cath lab yesterday as he had just had AAA repair and would not have been anticoagulation candidate yet.  Now has AKI.   Based on diffuse HK on echo also involving RV, cardiomyopathy may be due to hypoperfusion/shock related to cardiac arrest rather than due to acute MI.  He is now oliguric with CVP 16-18 range.  - Will give Lasix 160 mg IV x 1 this morning.  If minimal response, will likely need CVVH for volume management.  - Continue ASA/statin.  Would  ideally have coronary angiography but with improved ECG and AKI, not at this point.  4. AKI: Suspect related to hypoperfusion injury with cardiac arrest/CPR then AAA surgery.   - As above, will give Lasix bolus and if no response, likely will need CVVH.  - Will need to involve renal service.  5. Acute hypoxemic respiratory failure: CXR with LLL atelectasis.  He is awake on vent.  Volume overload may make weaning difficult.  - Per CCM.   CRITICAL CARE Performed by: Loralie Champagne  Total critical care time: 35 minutes  Critical care time was exclusive of separately billable procedures and treating other patients.  Critical care was necessary to treat or prevent imminent or life-threatening deterioration.  Critical care was time spent personally by me on the following activities: development of treatment plan with patient and/or surrogate as well as nursing, discussions with consultants, evaluation of patient's response to treatment, examination of patient, obtaining history from patient or surrogate, ordering and performing treatments and interventions, ordering and review of laboratory studies, ordering and review of radiographic studies, pulse oximetry and re-evaluation of patient's condition.   Length of Stay: 1  Loralie Champagne, MD  11/06/2018, 9:32 AM  Advanced Heart Failure Team Pager [phone] (M-F; 7a - 4p)  Please contact [facility] for night-coverage after hours (4p -7a ) and weekends on [email or web address]

## 2018-11-06 NOTE — Anesthesia Postprocedure Evaluation (Signed)
Anesthesia Post Note  Patient: Robert [name]  Procedure(s) Performed: ANEURYSM ABDOMINAL AORTIC REPAIR WITH HEMASHIELD GOLD VELOUR VASCULAR GRAFT (N/A )     Patient location during evaluation: SICU Anesthesia Type: General Level of consciousness: sedated and patient remains intubated per anesthesia plan Pain management: pain level controlled Vital Signs Assessment: post-procedure vital signs reviewed and stable Respiratory status: patient remains intubated per anesthesia plan and patient on ventilator - see flowsheet for VS Cardiovascular status: stable Postop Assessment: no apparent nausea or vomiting Anesthetic complications: no    Last Vitals:  Vitals:   11/06/18 1400 11/06/18 1500  BP: 128/88 (!) 131/98  Pulse: 79 73  Resp: 20 19  Temp:    SpO2: (!) 88% 92%    Last Pain:  Vitals:   11/06/18 0400  TempSrc: Esophageal  PainSc: 0-No pain                 Chalyn Amescua COKER

## 2018-11-06 NOTE — Progress Notes (Signed)
[facility]  Subjective  - responsive on vent   Objective 108/81 72 (!) 96.3 F (35.7 C) 20 93%  Intake/Output Summary (Last 24 hours) at 11/06/2018 1054 Last data filed at 11/06/2018 0900 Gross per 24 hour  Intake 4607.38 ml  Output 940 ml  Net 3667.38 ml   Abdomen obese soft  Extremities PT pulse right DP left  Assessment/Planning: S/p rupture AAA overall stable Acute renal failure probably ATN hopefully recover over next few days.  Critical service contacting nephrology today. RA pressure on ECHO 0 but CVP yesterday was 17.  Difficult to know if he is hyper or euvolemic at this point but I would suspect he is still third spacing and needs fluid.  Will defer to renal opinion.  Elevated cardiac enzymes spoke with DR Marigene Ehlers probably CPR and demand related BP heart rate reasonable EF 25% on ECHO  VDRF minimal vent settings hopefully extubate soon per [facility] 11/06/2018 10:54 AM --  Laboratory Lab Results: Recent Labs    11/05/18 0829  11/06/18 0430 11/06/18 0448  WBC 12.2*  --  13.2*  --   HGB 16.1   < > 14.9 13.9  HCT 47.1   < > 43.5 41.0  PLT 102*  --  81*  --    < > = values in this interval not displayed.   BMET Recent Labs    11/05/18 1207 11/06/18 0430 11/06/18 0448  NA 144 145 144  K 4.7 5.1 5.1  CL 111 110  --   CO2 22 24  --   GLUCOSE 146* 144*  --   BUN 21 40*  --   CREATININE 2.32* 4.59*  --   CALCIUM 7.8* 7.6*  --     COAG Lab Results  Component Value Date   INR 1.43 11/05/2018   INR 1.48 11/05/2018   INR 1.57 11/05/2018   No results found for: PTT

## 2018-11-06 NOTE — Progress Notes (Signed)
PT Cancellation Note  Patient Details Name: Robert [name] MRN: [id number] DOB: April 25, 1956   Cancelled Treatment:    Reason Eval/Treat Not Completed: Medical issues which prohibited therapy. Pt sedated on vent with PEEP of 10.  Ellamae Sia, PT, DPT Acute Rehabilitation Services Pager [phone] Office [phone]    Willy Eddy 11/06/2018, 1:14 PM

## 2018-11-07 ENCOUNTER — Inpatient Hospital Stay (HOSPITAL_COMMUNITY): Payer: 59

## 2018-11-07 ENCOUNTER — Encounter (HOSPITAL_COMMUNITY): Payer: Self-pay | Admitting: Vascular Surgery

## 2018-11-07 DIAGNOSIS — I5021 Acute systolic (congestive) heart failure: Secondary | ICD-10-CM

## 2018-11-07 DIAGNOSIS — I469 Cardiac arrest, cause unspecified: Secondary | ICD-10-CM

## 2018-11-07 DIAGNOSIS — I429 Cardiomyopathy, unspecified: Secondary | ICD-10-CM

## 2018-11-07 DIAGNOSIS — J9601 Acute respiratory failure with hypoxia: Secondary | ICD-10-CM

## 2018-11-07 DIAGNOSIS — N179 Acute kidney failure, unspecified: Secondary | ICD-10-CM

## 2018-11-07 DIAGNOSIS — N170 Acute kidney failure with tubular necrosis: Secondary | ICD-10-CM

## 2018-11-07 LAB — TYPE AND SCREEN
ABO/RH(D): O POS
ABO/RH(D): O POS
Antibody Screen: NEGATIVE
Antibody Screen: NEGATIVE
UNIT DIVISION: 0
UNIT DIVISION: 0
UNIT DIVISION: 0
Unit division: 0
Unit division: 0
Unit division: 0
Unit division: 0
Unit division: 0
Unit division: 0
Unit division: 0
Unit division: 0
Unit division: 0
Unit division: 0
Unit division: 0
Unit division: 0
Unit division: 0
Unit division: 0
Unit division: 0
Unit division: 0
Unit division: 0
Unit division: 0
Unit division: 0
Unit division: 0
Unit division: 0
Unit division: 0
Unit division: 0
Unit division: 0
Unit division: 0
Unit division: 0
Unit division: 0
Unit division: 0
Unit division: 0
Unit division: 0
Unit division: 0

## 2018-11-07 LAB — PREPARE FRESH FROZEN PLASMA
UNIT DIVISION: 0
UNIT DIVISION: 0
UNIT DIVISION: 0
Unit division: 0
Unit division: 0
Unit division: 0
Unit division: 0
Unit division: 0
Unit division: 0
Unit division: 0
Unit division: 0
Unit division: 0

## 2018-11-07 LAB — POCT I-STAT 7, (LYTES, BLD GAS, ICA,H+H)
Acid-base deficit: 4 mmol/L — ABNORMAL HIGH (ref 0.0–2.0)
Acid-base deficit: 1 mmol/L (ref 0.0–2.0)
Bicarbonate: 21.7 mmol/L (ref 20.0–28.0)
Bicarbonate: 22.9 mmol/L (ref 20.0–28.0)
Calcium, Ion: 0.97 mmol/L — ABNORMAL LOW (ref 1.15–1.40)
Calcium, Ion: 1.03 mmol/L — ABNORMAL LOW (ref 1.15–1.40)
HCT: 39 % (ref 39.0–52.0)
HCT: 39 % (ref 39.0–52.0)
HEMOGLOBIN: 13.3 g/dL (ref 13.0–17.0)
Hemoglobin: 13.3 g/dL (ref 13.0–17.0)
O2 Saturation: 99 %
O2 Saturation: 97 %
Patient temperature: 35.1
Patient temperature: 36.5
Potassium: 5.3 mmol/L — ABNORMAL HIGH (ref 3.5–5.1)
Potassium: 4.5 mmol/L (ref 3.5–5.1)
Sodium: 143 mmol/L (ref 135–145)
Sodium: 143 mmol/L (ref 135–145)
TCO2: 23 mmol/L (ref 22–32)
TCO2: 24 mmol/L (ref 22–32)
pCO2 arterial: 35.7 mmHg (ref 32.0–48.0)
pCO2 arterial: 32.7 mmHg (ref 32.0–48.0)
pH, Arterial: 7.382 (ref 7.350–7.450)
pH, Arterial: 7.451 — ABNORMAL HIGH (ref 7.350–7.450)
pO2, Arterial: 133 mmHg — ABNORMAL HIGH (ref 83.0–108.0)
pO2, Arterial: 83 mmHg (ref 83.0–108.0)

## 2018-11-07 LAB — BPAM FFP
BLOOD PRODUCT EXPIRATION DATE: 202002202359
BLOOD PRODUCT EXPIRATION DATE: 202002202359
BLOOD PRODUCT EXPIRATION DATE: 202002202359
Blood Product Expiration Date: 202002202359
Blood Product Expiration Date: 202002202359
Blood Product Expiration Date: 202002202359
Blood Product Expiration Date: 202002202359
Blood Product Expiration Date: 202002202359
Blood Product Expiration Date: 202002202359
Blood Product Expiration Date: 202002202359
Blood Product Expiration Date: 202002202359
Blood Product Expiration Date: 202002202359
Blood Product Expiration Date: 202002202359
Blood Product Expiration Date: 202002202359
Blood Product Expiration Date: 202002202359
Blood Product Expiration Date: 202002202359
Blood Product Expiration Date: 202002202359
Blood Product Expiration Date: 202002212359
ISSUE DATE / TIME: 202002151116
ISSUE DATE / TIME: 202002151116
ISSUE DATE / TIME: 202002150137
ISSUE DATE / TIME: 202002150137
ISSUE DATE / TIME: 202002150137
ISSUE DATE / TIME: 202002150137
ISSUE DATE / TIME: 202002150250
ISSUE DATE / TIME: 202002150250
ISSUE DATE / TIME: 202002150250
ISSUE DATE / TIME: 202002150250
ISSUE DATE / TIME: 202002150423
ISSUE DATE / TIME: 202002150423
ISSUE DATE / TIME: 202002150423
ISSUE DATE / TIME: 202002150423
ISSUE DATE / TIME: 202002151023
ISSUE DATE / TIME: 202002161114
UNIT TYPE AND RH: 5100
Unit Type and Rh: 5100
Unit Type and Rh: 5100
Unit Type and Rh: 5100
Unit Type and Rh: 5100
Unit Type and Rh: 5100
Unit Type and Rh: 5100
Unit Type and Rh: 5100
Unit Type and Rh: 5100
Unit Type and Rh: 5100
Unit Type and Rh: 5100
Unit Type and Rh: 5100
Unit Type and Rh: 5100
Unit Type and Rh: 5100
Unit Type and Rh: 5100
Unit Type and Rh: 5100
Unit Type and Rh: 5100
Unit Type and Rh: 9500

## 2018-11-07 LAB — BPAM RBC
BLOOD PRODUCT EXPIRATION DATE: 202003102359
BLOOD PRODUCT EXPIRATION DATE: 202003102359
BLOOD PRODUCT EXPIRATION DATE: 202003122359
BLOOD PRODUCT EXPIRATION DATE: 202003122359
BLOOD PRODUCT EXPIRATION DATE: 202003142359
BLOOD PRODUCT EXPIRATION DATE: 202003142359
Blood Product Expiration Date: 202003152359
Blood Product Expiration Date: 202003162359
Blood Product Expiration Date: 202003102359
Blood Product Expiration Date: 202003102359
Blood Product Expiration Date: 202003102359
Blood Product Expiration Date: 202003112359
Blood Product Expiration Date: 202003122359
Blood Product Expiration Date: 202003122359
Blood Product Expiration Date: 202003122359
Blood Product Expiration Date: 202003122359
Blood Product Expiration Date: 202003122359
Blood Product Expiration Date: 202003122359
Blood Product Expiration Date: 202003122359
Blood Product Expiration Date: 202003122359
Blood Product Expiration Date: 202003122359
Blood Product Expiration Date: 202003122359
Blood Product Expiration Date: 202003122359
Blood Product Expiration Date: 202003122359
Blood Product Expiration Date: 202003122359
Blood Product Expiration Date: 202003122359
Blood Product Expiration Date: 202003142359
Blood Product Expiration Date: 202003142359
Blood Product Expiration Date: 202003142359
Blood Product Expiration Date: 202003142359
Blood Product Expiration Date: 202003142359
Blood Product Expiration Date: 202003142359
Blood Product Expiration Date: 202003142359
Blood Product Expiration Date: 202003142359
ISSUE DATE / TIME: 202002142345
ISSUE DATE / TIME: 202002142345
ISSUE DATE / TIME: 202002150113
ISSUE DATE / TIME: 202002150113
ISSUE DATE / TIME: 202002150113
ISSUE DATE / TIME: 202002150113
ISSUE DATE / TIME: 202002150207
ISSUE DATE / TIME: 202002150207
ISSUE DATE / TIME: 202002150207
ISSUE DATE / TIME: 202002150207
ISSUE DATE / TIME: 202002150246
ISSUE DATE / TIME: 202002150246
ISSUE DATE / TIME: 202002150246
ISSUE DATE / TIME: 202002150246
ISSUE DATE / TIME: 202002150325
ISSUE DATE / TIME: 202002150325
ISSUE DATE / TIME: 202002150325
ISSUE DATE / TIME: 202002151132
ISSUE DATE / TIME: 202002151330
ISSUE DATE / TIME: 202002151717
ISSUE DATE / TIME: 202002151834
ISSUE DATE / TIME: 202002151834
ISSUE DATE / TIME: 202002151834
ISSUE DATE / TIME: 202002151834
ISSUE DATE / TIME: 202002151839
ISSUE DATE / TIME: 202002151839
ISSUE DATE / TIME: 202002151839
ISSUE DATE / TIME: 202002151839
UNIT TYPE AND RH: 5100
UNIT TYPE AND RH: 5100
Unit Type and Rh: 9500
Unit Type and Rh: 9500
Unit Type and Rh: 5100
Unit Type and Rh: 5100
Unit Type and Rh: 5100
Unit Type and Rh: 5100
Unit Type and Rh: 5100
Unit Type and Rh: 5100
Unit Type and Rh: 5100
Unit Type and Rh: 5100
Unit Type and Rh: 5100
Unit Type and Rh: 5100
Unit Type and Rh: 5100
Unit Type and Rh: 5100
Unit Type and Rh: 5100
Unit Type and Rh: 5100
Unit Type and Rh: 5100
Unit Type and Rh: 5100
Unit Type and Rh: 5100
Unit Type and Rh: 5100
Unit Type and Rh: 5100
Unit Type and Rh: 5100
Unit Type and Rh: 5100
Unit Type and Rh: 5100
Unit Type and Rh: 5100
Unit Type and Rh: 5100
Unit Type and Rh: 5100
Unit Type and Rh: 5100
Unit Type and Rh: 5100
Unit Type and Rh: 5100
Unit Type and Rh: 5100
Unit Type and Rh: 5100

## 2018-11-07 LAB — COMPREHENSIVE METABOLIC PANEL
ALT: 19 U/L (ref 0–44)
AST: 66 U/L — ABNORMAL HIGH (ref 15–41)
Albumin: 2.6 g/dL — ABNORMAL LOW (ref 3.5–5.0)
Alkaline Phosphatase: 48 U/L (ref 38–126)
Anion gap: 16 — ABNORMAL HIGH (ref 5–15)
BUN: 63 mg/dL — ABNORMAL HIGH (ref 8–23)
CO2: 21 mmol/L — ABNORMAL LOW (ref 22–32)
Calcium: 7.8 mg/dL — ABNORMAL LOW (ref 8.9–10.3)
Chloride: 108 mmol/L (ref 98–111)
Creatinine, Ser: 5.6 mg/dL — ABNORMAL HIGH (ref 0.61–1.24)
GFR calc Af Amer: 12 mL/min — ABNORMAL LOW (ref >60)
GFR calc non Af Amer: 10 mL/min — ABNORMAL LOW (ref >60)
Glucose, Bld: 138 mg/dL — ABNORMAL HIGH (ref 70–99)
Potassium: 4.5 mmol/L (ref 3.5–5.1)
SODIUM: 145 mmol/L (ref 135–145)
Total Bilirubin: 1 mg/dL (ref 0.3–1.2)
Total Protein: 5.6 g/dL — ABNORMAL LOW (ref 6.5–8.1)

## 2018-11-07 LAB — BASIC METABOLIC PANEL
Anion gap: 14 (ref 5–15)
BUN: 68 mg/dL — AB (ref 8–23)
CALCIUM: 8.1 mg/dL — AB (ref 8.9–10.3)
CO2: 22 mmol/L (ref 22–32)
Chloride: 108 mmol/L (ref 98–111)
Creatinine, Ser: 5.23 mg/dL — ABNORMAL HIGH (ref 0.61–1.24)
GFR calc Af Amer: 13 mL/min — ABNORMAL LOW (ref >60)
GFR calc non Af Amer: 11 mL/min — ABNORMAL LOW (ref >60)
Glucose, Bld: 131 mg/dL — ABNORMAL HIGH (ref 70–99)
Potassium: 4.3 mmol/L (ref 3.5–5.1)
Sodium: 144 mmol/L (ref 135–145)

## 2018-11-07 LAB — HEMOGLOBIN A1C
HEMOGLOBIN A1C: 5.5 % (ref 4.8–5.6)
Mean Plasma Glucose: 111 mg/dL

## 2018-11-07 LAB — CBC WITH DIFFERENTIAL/PLATELET
Abs Immature Granulocytes: 0.1 10*3/uL — ABNORMAL HIGH (ref 0.00–0.07)
Basophils Absolute: 0 10*3/uL (ref 0.0–0.1)
Basophils Relative: 0 %
Eosinophils Absolute: 0 10*3/uL (ref 0.0–0.5)
Eosinophils Relative: 0 %
HCT: 42.5 % (ref 39.0–52.0)
HEMOGLOBIN: 14.3 g/dL (ref 13.0–17.0)
Immature Granulocytes: 1 %
LYMPHS PCT: 7 %
Lymphs Abs: 1 10*3/uL (ref 0.7–4.0)
MCH: 29.6 pg (ref 26.0–34.0)
MCHC: 33.6 g/dL (ref 30.0–36.0)
MCV: 88 fL (ref 80.0–100.0)
Monocytes Absolute: 0.9 10*3/uL (ref 0.1–1.0)
Monocytes Relative: 6 %
Neutro Abs: 11.9 10*3/uL — ABNORMAL HIGH (ref 1.7–7.7)
Neutrophils Relative %: 86 %
Platelets: 75 10*3/uL — ABNORMAL LOW (ref 150–400)
RBC: 4.83 MIL/uL (ref 4.22–5.81)
RDW: 17.4 % — ABNORMAL HIGH (ref 11.5–15.5)
WBC: 13.9 10*3/uL — ABNORMAL HIGH (ref 4.0–10.5)
nRBC: 0 % (ref 0.0–0.2)

## 2018-11-07 LAB — CALCIUM, IONIZED

## 2018-11-07 LAB — BLOOD PRODUCT ORDER (VERBAL) VERIFICATION

## 2018-11-07 LAB — PHOSPHORUS: Phosphorus: 7.2 mg/dL — ABNORMAL HIGH (ref 2.5–4.6)

## 2018-11-07 LAB — MAGNESIUM: Magnesium: 2.2 mg/dL (ref 1.7–2.4)

## 2018-11-07 MED ORDER — HYDRALAZINE HCL 20 MG/ML IJ SOLN
10.0000 mg | INTRAMUSCULAR | Status: DC | PRN
  Administered 2018-11-08 – 2018-11-10 (×5): 10 mg via INTRAVENOUS
  Filled 2018-11-07 (×5): qty 1

## 2018-11-07 MED ORDER — ORAL CARE MOUTH RINSE
15.0000 mL | Freq: Two times a day (BID) | OROMUCOSAL | Status: DC
  Administered 2018-11-08: 15 mL via OROMUCOSAL

## 2018-11-07 MED ORDER — METOPROLOL TARTRATE 5 MG/5ML IV SOLN
5.0000 mg | INTRAVENOUS | Status: DC | PRN
  Administered 2018-11-08 – 2018-11-11 (×6): 5 mg via INTRAVENOUS
  Filled 2018-11-07 (×7): qty 5

## 2018-11-07 MED ORDER — MORPHINE SULFATE (PF) 2 MG/ML IV SOLN
2.0000 mg | INTRAVENOUS | Status: DC | PRN
  Administered 2018-11-07 – 2018-11-10 (×3): 2 mg via INTRAVENOUS
  Filled 2018-11-07 (×2): qty 1

## 2018-11-07 MED ORDER — LACTATED RINGERS IV SOLN
INTRAVENOUS | Status: DC
  Administered 2018-11-07 – 2018-11-11 (×5): via INTRAVENOUS

## 2018-11-07 NOTE — Progress Notes (Signed)
[place] Progress Note Patient Name: Robert [name] DOB: August 11, 1956 MRN: [id number]   Date of Service  11/07/2018  HPI/Events of Note  Hypertension - BP = 174/100 and HR = 98.  Patient was on Hydralazine and Metoprolol IV, however, orders have expired.   eICU Interventions  Will order: 1. Hydralazine 10 mg IV Q 4 hours PRN SBP > 170 or DBP > 100.  2. Metoprolol 5 mg IV Q 3 hours PRN SBP > 170 or DBP > 100. Hold for HR < 60.      Intervention Category Major Interventions: Hypertension - evaluation and management  Sommer,Steven Eugene 11/07/2018, 11:13 PM

## 2018-11-07 NOTE — Progress Notes (Signed)
[facility]  Subjective  - awake extubated has abdominal pain   Objective (!) 168/96 (!) 103 97.8 F (36.6 C) (Oral) (!) 27 96%  Intake/Output Summary (Last 24 hours) at 11/07/2018 1729 Last data filed at 11/07/2018 1700 Gross per 24 hour  Intake 403.14 ml  Output 1225 ml  Net -821.86 ml   Incision dressing intact no drainage  Assessment/Planning: POD #2 s/p rupture AAA  Renal failure non oliguric creatinine seems to have peaked gently hydrate for NG losses  Ileus Ng til has BM  OOB ambulate  Ruta Hinds 11/07/2018 5:29 PM --  Laboratory Lab Results: Recent Labs    11/06/18 0430  11/07/18 0429 11/07/18 0432  WBC 13.2*  --   --  13.9*  HGB 14.9   < > 13.3 14.3  HCT 43.5   < > 39.0 42.5  PLT 81*  --   --  75*   < > = values in this interval not displayed.   BMET Recent Labs    11/07/18 0432 11/07/18 1441  NA 145 144  K 4.5 4.3  CL 108 108  CO2 21* 22  GLUCOSE 138* 131*  BUN 63* 68*  CREATININE 5.60* 5.23*  CALCIUM 7.8* 8.1*    COAG Lab Results  Component Value Date   INR 1.43 11/05/2018   INR 1.48 11/05/2018   INR 1.57 11/05/2018   No results found for: PTT

## 2018-11-07 NOTE — Progress Notes (Signed)
Progress Note  Patient Name: Robert [name] Date of Encounter: 11/07/2018  Primary Cardiologist: Dr. Aundra Dubin  Subjective   Intubated on vent; awake  Inpatient Medications    Scheduled Meds: . sodium chloride   Intravenous Once  . aspirin  81 mg Oral Daily  . atorvastatin  80 mg Oral q1800  . chlorhexidine gluconate (MEDLINE KIT)  15 mL Mouth Rinse BID  . Chlorhexidine Gluconate Cloth  6 each Topical Daily  . mouth rinse  15 mL Mouth Rinse 10 times per day  . pantoprazole (PROTONIX) IV  40 mg Intravenous QHS  . sodium chloride flush  10-40 mL Intracatheter Q12H   Continuous Infusions: . sodium chloride    . dexmedetomidine 0.5 mcg/kg/hr (11/07/18 0800)   PRN Meds: sodium chloride, acetaminophen **OR** acetaminophen, bisacodyl, fentaNYL, hydrALAZINE, metoprolol tartrate, ondansetron, phenol, sodium chloride flush   Vital Signs    Vitals:   11/07/18 0600 11/07/18 0700 11/07/18 0800 11/07/18 0809  BP: (!) 142/93 (!) 142/95 (!) 158/94 (!) 155/71  Pulse: 74 75 83 89  Resp: _0 (!) 21  Temp:      TempSrc:      SpO2: 98% 98% 99% 97%  Weight:      Height:        Intake/Output Summary (Last 24 hours) at 11/07/2018 0823 Last data filed at 11/07/2018 0800 Gross per 24 hour  Intake 569.51 ml  Output 1470 ml  Net -900.49 ml    I/O since admission: +[id number]  [facility] Weights   11/04/18 2311  Weight: 136.1 kg    Telemetry    Sinus at 73 - Personally Reviewed  ECG   F/U 2/15 ECG (independently read by me): NSR with improvement in STE and residual T changes inferolaterally   2/15 ECG (independently read by me): NST 82; STE V2-3 inferior T changes  Physical Exam    BP (!) 155/71   Pulse 89   Temp 97.7 F (36.5 C) (Esophageal)   Resp (!) 21   Ht _1  (1.93 m)   Wt 136.1 kg   SpO2 97%   BMI 36.52 kg/m  General: Alert, oriented, no distress.  Skin: normal turgor, no rashes, warm and dry HEENT: Normocephalic, atraumatic. Pupils equal round and  reactive to light; sclera anicteric; extraocular muscles intact;  Nose without nasal septal hypertrophy Mouth/Parynx benign; Mallinpatti scale intubated Neck: No JVD, no carotid bruits; normal carotid upstroke Lungs: clear to ausculatation and percussion; no wheezing or rales Chest wall: without tenderness to palpitation Heart: PMI not displaced, RRR, s1 s2 normal, 1/6 systolic murmur, no diastolic murmur, no rubs, gallops, thrills, or heaves Abdomen: distended; soft, nontender; no hepatosplenomehaly, BS+; abdominal aorta nontender and not dilated by palpation. Back: no CVA tenderness Pulses 2+ Musculoskeletal: full range of motion, normal strength, no joint deformities Extremities: no clubbing cyanosis or edema, Homan's sign negative  Neurologic: grossly nonfocal; Cranial nerves grossly wnl Psychologic: Normal mood and affect   Labs    Chemistry Recent Labs  Lab 11/05/18 0829 11/05/18 1207 11/06/18 0430  11/06/18 1232 11/07/18 0429 11/07/18 0432  NA 144 144 145   < > 143 143 145  K 5.1 4.7 5.1   < > 4.5 4.5 4.5  CL 112* 111 110  --   --   --  108  CO2 21* 22 24  --   --   --  21*  GLUCOSE 160* 146* 144*  --   --   --  138*  BUN  18 21 40*  --   --   --  63*  CREATININE 1.76* 2.32* 4.59*  --   --   --  5.60*  CALCIUM 8.1* 7.8* 7.6*  --   --   --  7.8*  PROT 4.2*  --  5.3*  --   --   --  5.6*  ALBUMIN 2.5*  --  2.8*  --   --   --  2.6*  AST 61*  --  145*  --   --   --  66*  ALT 25  --  49*  --   --   --  19  ALKPHOS 38  --  44  --   --   --  48  BILITOT 2.0*  --  0.7  --   --   --  1.0  GFRNONAA 41* 29* 13*  --   --   --  10*  GFRAA 47* 34* 15*  --   --   --  12*  ANIONGAP _0 --   --   --  16*   < > = values in this interval not displayed.     Hematology Recent Labs  Lab 11/05/18 0829  11/06/18 0430  11/06/18 1232 11/07/18 0429 11/07/18 0432  WBC 12.2*  --  13.2*  --   --   --  13.9*  RBC 5.53  --  5.04  --   --   --  4.83  HGB 16.1   < > 14.9   < > 13.3  13.3 14.3  HCT 47.1   < > 43.5   < > 39.0 39.0 42.5  MCV 85.2  --  86.3  --   --   --  88.0  MCH 29.1  --  29.6  --   --   --  29.6  MCHC 34.2  --  34.3  --   --   --  33.6  RDW 16.2*  --  17.4*  --   --   --  17.4*  PLT 102*  --  81*  --   --   --  75*   < > = values in this interval not displayed.    Cardiac Enzymes Recent Labs  Lab 11/05/18 0829 11/05/18 1446 11/05/18 2005 11/06/18 1108  TROPONINI 2.02* 5.71* 5.23* 1.00*   No results for input(s): TROPIPOC in the last 168 hours.   BNPNo results for input(s): BNP, PROBNP in the last 168 hours.   DDimer  Recent Labs  Lab 11/05/18 0312 11/05/18 0446  DDIMER >20.00* >20.00*     Lipid Panel  No results found for: CHOL, TRIG, HDL, CHOLHDL, VLDL, LDLCALC, LDLDIRECT   Radiology    US Renal  Result Date: 11/05/2018 CLINICAL DATA:  Acute renal failure, obesity. EXAM: RENAL / URINARY TRACT ULTRASOUND COMPLETE COMPARISON:  None. FINDINGS: Right Kidney: Renal measurements: 12.5 x 5.2 x 5.6 cm = volume: 198 mL . Echogenicity within normal limits. No mass or hydronephrosis visualized. Left Kidney: Renal measurements: 11.5 x 6.3 x 5.7 cm = volume: 218 mL. Echogenicity within normal limits. No mass or hydronephrosis visualized. Suboptimal visualization due to nearby surgical changes/gauze. Bladder: Appears normal for degree of bladder distention. IMPRESSION: Normal bilateral renal ultrasound, with mild study limitations detailed above. Electronically Signed   By: Franki Cabot M.D.   On: 11/05/2018 20:22   Dg Chest Port 1 View  Result Date: 11/07/2018 CLINICAL DATA:  Pulmonary edema.  Respiratory  difficulty. EXAM: PORTABLE CHEST 1 VIEW COMPARISON:  11/06/2018 FINDINGS: Endotracheal tube, NG tube, left subclavian central venous catheter are stable. Normal heart size. Low lung volumes. Increased right basilar atelectasis. Stable left basilar consolidation. Stable left pleural effusion. No pneumothorax. IMPRESSION: Right basilar atelectasis  is worse. Stable left basilar consolidation and left pleural effusion. Electronically Signed   By: Marybelle Killings M.D.   On: 11/07/2018 07:48   Dg Chest Port 1 View  Result Date: 11/06/2018 CLINICAL DATA:  Postop day 1 ruptured abdominal aortic aneurysm repair. Ventilator dependent. EXAM: PORTABLE CHEST 1 VIEW COMPARISON:  11/05/2018 and earlier. FINDINGS: Endotracheal tube tip in satisfactory position approximately 4 cm above the carina. Nasogastric tube courses below the diaphragm into the stomach. External pacing pads. Suboptimal inspiration. Consolidation in the LEFT LOWER LOBE, unchanged since yesterday. Lungs remain clear otherwise. Pulmonary vascularity normal. Cardiac silhouette upper normal in size. IMPRESSION: 1.  Support apparatus satisfactory. 2. Stable dense LEFT LOWER LOBE atelectasis (favored over pneumonia). 3. No new abnormalities. Electronically Signed   By: Evangeline Dakin M.D.   On: 11/06/2018 07:44   Dg Chest Port 1 View  Result Date: 11/05/2018 CLINICAL DATA:  Evaluate ETT. EXAM: PORTABLE CHEST 1 VIEW COMPARISON:  November 05, 2018 FINDINGS: The ETT terminates 5.5 cm above the carina and 4 cm below the thoracic inlet in good position. An NG tube terminates below today's film. A left central line terminates in the SVC. No pneumothorax. Atelectasis and possibly a tiny effusion in the left base. The cardiomediastinal silhouette is stable. No other changes. A transcutaneous pacer lead partially obscures the left chest. IMPRESSION: 1. The ETT is in good position.  Other support apparatus as above. 2. Mild atelectasis and possible tiny associated effusion in the left base. Electronically Signed   By: Dorise Bullion III M.D   On: 11/05/2018 19:51   Vas US Renal Artery Duplex  Result Date: 11/06/2018 ABDOMINAL VISCERAL Indications: Acute episode on Chronic Renal failure;              AAA rupture. Limitations: Air/bowel gas, obesity and Patient is in unconsciousness, unable to cooperate with  breathing technic and immobility of body. Comparison Study: No comparison study available. Performing Technologist: Rudell Cobb  Examination Guidelines: A complete evaluation includes B-mode imaging, spectral Doppler, color Doppler, and power Doppler as needed of all accessible portions of each vessel. Bilateral testing is considered an integral part of a complete examination. Limited examinations for reoccurring indications may be performed as noted.  Duplex Findings: +----------+--------+--------+------+--------+ MesentericPSV cm/sEDV cm/sPlaqueComments +----------+--------+--------+------+--------+ Aorta Mid   135      63                  +----------+--------+--------+------+--------+  +------------------+--------+--------+------------------------------+ Right Renal ArteryPSV cm/sEDV cm/s           Comment             +------------------+--------+--------+------------------------------+ Origin                            unable to see due to bowel gas +------------------+--------+--------+------------------------------+ Proximal             94      38                                  +------------------+--------+--------+------------------------------+ Mid  149      55                                  +------------------+--------+--------+------------------------------+ Distal               50      23                                  +------------------+--------+--------+------------------------------+ +-----------------+--------+--------+------------------------------+ Left Renal ArteryPSV cm/sEDV cm/s           Comment             +-----------------+--------+--------+------------------------------+ Origin                           unable to see due to bowel gas +-----------------+--------+--------+------------------------------+ Proximal           158      59                                   +-----------------+--------+--------+------------------------------+ Mid                 57      22                                  +-----------------+--------+--------+------------------------------+ Distal              82      24                                  +-----------------+--------+--------+------------------------------+ +------------+--------+--------+----+-----------+--------+--------+----+ Right KidneyPSV cm/sEDV cm/sRI  Left KidneyPSV cm/sEDV cm/sRI   +------------+--------+--------+----+-----------+--------+--------+----+ Upper Pole  17      7       0.60Upper Pole 27      13      0.53 +------------+--------+--------+----+-----------+--------+--------+----+ Mid         24      11      0.54Mid        28      11      0.62 +------------+--------+--------+----+-----------+--------+--------+----+ Lower Pole  16      6       0.62Lower Pole 32      14      0.57 +------------+--------+--------+----+-----------+--------+--------+----+ Hilar       48      20      0.58Hilar      40      12      0.70 +------------+--------+--------+----+-----------+--------+--------+----+ +------------------+-----+------------------+-----+ Right Kidney           Left Kidney             +------------------+-----+------------------+-----+ RAR                    RAR                     +------------------+-----+------------------+-----+ RAR (manual)      1.10 RAR (manual)      1.17  +------------------+-----+------------------+-----+ Cortex                 Cortex                  +------------------+-----+------------------+-----+  Cortex thickness       Corex thickness         +------------------+-----+------------------+-----+ Kidney length (cm)13.90Kidney length (cm)14.10 +------------------+-----+------------------+-----+  Summary: Renal:  Right: No evidence of right renal artery stenosis. Normal right        Resisitive Index. RRV flow present.  Left:  No evidence of left renal artery stenosis. Normal left        Resistive Index. LRV flow present. Mesenteric:  SMA and Celiac artery can not be able to evaluate due to bowel gas and patient body habitus.  *See table(s) above for measurements and observations.  Diagnosing physician: Ruta Hinds MD  Electronically signed by Ruta Hinds MD on 11/06/2018 at 2:16:17 PM.    Final     Cardiac Studies   11/05/18 ECHO IMPRESSIONS   1. The left ventricle has severely reduced systolic function, with an ejection fraction of 20-25%. The cavity size was normal. There is moderately increased left ventricular wall thickness. Left ventricular diastolic Doppler parameters are consistent  with impaired relaxation Left ventricular diffuse hypokinesis.  2. The right ventricle has moderately reduced systolic function. The cavity was mildly enlarged. There is mildly increased right ventricular wall thickness.  3. The mitral valve is normal in structure. No evidence of mitral valve stenosis. No significant regurgitation.  4. The tricuspid valve is normal in structure.  5. The aortic valve is tricuspid no stenosis of the aortic valve.  6. The pulmonic valve was normal in structure.  7. The aortic root and ascending aorta are normal in size and structure.  8. No complete TR doppler jet so unable to estimate PA systolic pressure.  Patient Profile     Robert [name] is a 63 y.o. male with a hx of AAA, HTN, HLP, fibromuscular dysplasia s/p bilateral renal artery angioplasty in 2006 and underwent for AAA rupture  Assessment & Plan    1. S/p Ruptured AAA evening of 2/14 with emergent operative repair early  2/15 am  2. CPR with transient STE in [place] prior to transfer;  Resolution of STE on subsequent ECG.  3. Acute systolic heart failure;  EF 20 - 25% on echo; trop peal 5.71 with trending downward.  Diffuse hypokinesis probably contributed by transient shock/cardiac arrest  4. AKI  Cr  increased to 5.6 seen by nephrology making urine output 575 last night with lasix yesterday.   5. HTN:  BP elevated at 150 - 160s; receiving hydralazine PRN   6. Respiratory Failure; awake on vent; atelectasis on cxr.  Will ultimately need R and Left heart cath if renal function improves, Doubt acute MI and cardiomyopathy related to shock/hypoperfusion and cardiac arrest. If BP continues to be elevated with hydralazine can consider iv NTG which will also benefit pre-load reduction.   CC; 40 minutes  Signed, Troy Sine, MD, [facility] 11/07/2018, 8:23 AM

## 2018-11-07 NOTE — Progress Notes (Signed)
SLP Cancellation Note  Patient Details Name: Robert [name] MRN: [id number] DOB: 01/04/1956   Cancelled treatment:       Reason Eval/Treat Not Completed: Patient not medically ready. Orders received for swallow evaluation. Per chart, he is intubated and NPO per surgery. SLP will f/u for swallow evaluation as able once he is extubated at medically cleared for POs.   [name] Ellaree Gear 11/07/2018, 3:15 PM  Nuala Alpha, M.A. [place] Acute Environmental education officer [phone] Office [phone]

## 2018-11-07 NOTE — Progress Notes (Signed)
OT Cancellation Note  Patient Details Name: Robert [name] MRN: [id number] DOB: April 16, 1956   Cancelled Treatment:    Reason Eval/Treat Not Completed: Patient not medically ready.  Remains on vent with PEEP of 10.  Will reattempt as appropriate.  Lucille Passy, OTR/L [place] Pager [phone] Office [phone]   Lucille Passy M 11/07/2018, 12:29 PM

## 2018-11-07 NOTE — Progress Notes (Signed)
NAME:  Robert [name], MRN:  [id number], DOB:  April 04, 1956, LOS: 2 ADMISSION DATE:  11/04/2018, CONSULTATION DATE:  2.15.2020 REFERRING MD:  Oneida Alar, MD CHIEF COMPLAINT:  Post-operative AAA-ruptured  Brief History   62 y.o.male,presented to [facility] med center with abdominal pain. Pain became severe was there he was diagnosed with a ruptured abdominal aortic aneurysm. Became syncopal and pulseless requiring a  brief course of CPR at [facility].Was not intubated at the time.  Transferred by EMS directly to the operating room at [facility]. The patient was awake on arrival to the hospital and complaining of abdominal pain. Known abdominal aortic aneurysm and scheduled for follow-up in December 2019 but did not follow up.  In OR he had a significant amount of blood loss estimated at 1400 cc. Received multiple units of blood, platelets and FFP. Intubated with glidescope without difficulty. Hypotensive initially, now off pressors. Given paralyzing agent and sedation prior to arrival to unit. Had left subclavian CVC and right radial arterial catheter placed. No vascular reanastamosis. Had graft placed infrarenal. Bowel appeared "purple" but improved at end of case. No bowel perforation or enterotomy.  Has history of AAA and renal artery stenosis requiring angioplasty. Found to have ECG changes post-operatively this morning and stat ECHO shows global reduction in EF of 20%.  Interim history/subjective:  RN reports pt on low dose precedex.  Pt awake, follows commands on precedex.  1.4L UOP in last 24 hours.   Past Medical History  AAA - ruptured 2/14  Fibromuscular dysphasia of renal artery s/p angioplasty to both renal arteries  HLD HTN  Hernia repair   Significant Hospital Events   2/14  Admit with ruptured AAA, brief CPR in ER.  OR details as above.   2/16  Following commands, on precedex.  AKI  Consults:  CCM Cardiology  Procedures:  Repair of AAA 2/15  TLC 2/14 >>  Arterial Line  2/14 >>  ETT 2/14 >>   Significant Diagnostic Tests:  CTA Chest/Abd/Pelvis 2/14 >> known fusiform infrarenal abdominal aortic aneurysm measuring 5 cm in AP diameter with evidence of acute rupture with moderate adjacent periaortic / retroperitoneal hemorrhage.  Rupture is anteriorly just below the takeoff of the inferior mesenteric artery ECHO 2/15 >> LVEF 20-25%, moderately increased LV wall thickness, RV moderately reduced systolic function, aortic root and ascending aorta normal in size, no TR doppler jet to estimate PA systolic pressure US Renal 2/15 >> normal bilateral renal ultrasound  Antimicrobials:    Objective   Blood pressure (!) 151/95, pulse 81, temperature 97.7 F (36.5 C), temperature source Core, resp. rate 18, height 6\' 4"  (1.93 m), weight 136.1 kg, SpO2 97 %. CVP:  [7 mmHg-15 mmHg] 15 mmHg  Vent Mode: PRVC FiO2 (%):  [50 %] 50 % Set Rate:  [20 bmp] 20 bmp Vt Set:  [690 mL] 690 mL PEEP:  [10 cmH20] 10 cmH20 Plateau Pressure:  [22 cmH20-26 cmH20] 23 cmH20   Intake/Output Summary (Last 24 hours) at 11/07/2018 1150 Last data filed at 11/07/2018 0800 Gross per 24 hour  Intake 489.75 ml  Output 1430 ml  Net -940.25 ml   Filed Weights   11/04/18 2311  Weight: 136.1 kg   Examination: General:  Adult male lying in bed on vent in NAD HEENT: MM pink/moist, ETT, NGT Neuro: Awakens to voice, follows commands / communicates appropriately, normal strength  CV: s1s2 rrr, no m/r/g PULM: even/non-labored, lungs bilaterally clear  GI: abd soft, midline abd dressing clean/dry/intact  Extremities:  warm/dry, trace edema  Skin: no rashes or lesions  Resolved Hospital Problem list   Hemorrhagic Shock  Lactic Acidosis   Assessment & Plan:   Ruptured AAA s/p Repair  P: Post operative recommendations per VVS  NPO post surgery > await VVS approval   Cardiac Arrest STEMI  Acute sCHF / Cardiomyopathy - concern for global dysfunction related to hemorrhagic shock Hx HTN - on  ACE-I as outpatient  P: Cardiology following Tele monitoring  Will ultimately need L/RHC > priors have been clean and no mention of atherosclerotic disease on CT  PRN Hydralazine, NTG if needed for BP control   Acute Hypoxic Respiratory Failure  -in setting of ruptured AAA, cardiac arrest  P: PRVC 8cc/kg as rest mode SBT wean with goal for extubation  Follow intermittent CXR  Hold diuresis given AKI   AKI Fibromuscular Dyplasia s/p Bilateral Renal Angioplasty (2006) P: Appreciate Nephrology  Trend BMP / urinary output Replace electrolytes as indicated Avoid nephrotoxic agents, ensure adequate renal perfusion KVO IVF  Renal dose medications    Electrolyte Disturbance: hyperphosphatemia  P: Monitor lytes, replace as indicated   At Risk Malnutrition  P: NPO for now Consider TF > await ok from VVS  Best practice:  Diet: NPO per surgery Pain/Anxiety/Delirium protocol (if indicated): in place VAP protocol (if indicated): Yes DVT prophylaxis: Chemical DVT On hold per surgery. SCD's GI prophylaxis: Protonix Glucose control: Following Mobility: as tolerated  Code Status: Full Family Communication: Wife updated at bedside 2/17 Disposition: ICU   Labs   CBC: Recent Labs  Lab 11/04/18 2330  11/05/18 0446  11/05/18 0737 11/05/18 0829  11/06/18 0430 11/06/18 0448 11/06/18 1232 11/07/18 0429 11/07/18 0432  WBC 8.7  --   --   --  12.9* 12.2*  --  13.2*  --   --   --  13.9*  NEUTROABS 5.5  --   --   --   --  9.1*  --   --   --   --   --  11.9*  HGB 15.5   < >  --    < > 16.3 16.1   < > 14.9 13.9 13.3 13.3 14.3  HCT 48.7   < >  --    < > 46.4 47.1   < > 43.5 41.0 39.0 39.0 42.5  MCV 98.0  --   --   --  86.1 85.2  --  86.3  --   --   --  88.0  PLT PLATELET CLUMPS NOTED ON SMEAR, COUNT APPEARS ADEQUATE   < > 128*  --  95* 102*  --  81*  --   --   --  75*   < > = values in this interval not displayed.    Basic Metabolic Panel: Recent Labs  Lab 11/05/18 0737  11/05/18 0829 11/05/18 1207 11/06/18 0430 11/06/18 0448 11/06/18 1232 11/07/18 0429 11/07/18 0432  NA 145 144 144 145 144 143 143 145  K 5.2* 5.1 4.7 5.1 5.1 4.5 4.5 4.5  CL 112* 112* 111 110  --   --   --  108  CO2 22 21* 22 24  --   --   --  21*  GLUCOSE 123* 160* 146* 144*  --   --   --  138*  BUN 16 18 21  40*  --   --   --  63*  CREATININE 1.54* 1.76* 2.32* 4.59*  --   --   --  5.60*  CALCIUM 8.3* 8.1* 7.8* 7.6*  --   --   --  7.8*  MG 1.6*  --   --  2.1  --   --   --  2.2  PHOS  --   --   --   --   --   --   --  7.2*   GFR: Estimated Creatinine Clearance: 20.9 mL/min (A) (by C-G formula based on SCr of 5.6 mg/dL (H)). Recent Labs  Lab 11/05/18 0737 11/05/18 0829  11/05/18 1446 11/05/18 1814 11/05/18 2005 11/06/18 0430 11/06/18 1441 11/07/18 0432  WBC 12.9* 12.2*  --   --   --   --  13.2*  --  13.9*  LATICACIDVEN 5.5* 4.2*   < > 3.9* 4.1* 4.3*  --  2.7*  --    < > = values in this interval not displayed.    Liver Function Tests: Recent Labs  Lab 11/05/18 0829 11/06/18 0430 11/07/18 0432  AST 61* 145* 66*  ALT 25 49* 19  ALKPHOS 38 44 48  BILITOT 2.0* 0.7 1.0  PROT 4.2* 5.3* 5.6*  ALBUMIN 2.5* 2.8* 2.6*   Recent Labs  Lab 11/06/18 0430  AMYLASE 193*   Recent Labs  Lab 11/05/18 0834  AMMONIA 39*    ABG    Component Value Date/Time   PHART 7.451 (H) 11/07/2018 0429   PCO2ART 32.7 11/07/2018 0429   PO2ART 83.0 11/07/2018 0429   HCO3 22.9 11/07/2018 0429   TCO2 24 11/07/2018 0429   ACIDBASEDEF 1.0 11/07/2018 0429   O2SAT 97.0 11/07/2018 0429     Coagulation Profile: Recent Labs  Lab 11/05/18 0312 11/05/18 0446 11/05/18 0829  INR 1.57 1.48 1.43    Cardiac Enzymes: Recent Labs  Lab 11/04/18 2330 11/05/18 0829 11/05/18 1446 11/05/18 2005 11/06/18 1108  TROPONINI 0.04* 2.02* 5.71* 5.23* 1.00*    HbA1C: Hgb A1c MFr Bld  Date/Time Value Ref Range Status  11/05/2018 08:34 AM 5.5 4.8 - 5.6 % Final    Comment:    (NOTE)          Prediabetes: 5.7 - 6.4         Diabetes: >6.4         Glycemic control for adults with diabetes: <7.0     CBG: No results for input(s): GLUCAP in the last 168 hours.  Critical care time: 43 minutes    Noe Gens, NP-C [facility] Pgr: [phone] or if no answer [phone] 11/07/2018, 11:50 AM

## 2018-11-07 NOTE — Progress Notes (Signed)
Contacted MD sommers about pt. SBP. MD ordered Hydralazine PRN and Metoprolol PRN and says to give 2 hours in between giving each dose. Last metoprolol was given 2210. MD says to give hydralazine at 0000. Pt. Current SBP 171

## 2018-11-07 NOTE — Progress Notes (Signed)
Unable to obtain daily weight d/t bed malfunction

## 2018-11-07 NOTE — Progress Notes (Signed)
Admit: 11/04/2018 LOS: 2  70M s/p AAA rupture repaired 2/15 with AKI (Admit SCr 1.38) from ATN  Subjective:  [name] Kitchen SCr cont to worsen, 5.6. K 4.5, HCO3 21  . 1.4L UOP yesterday . Wife at bedside, updated . Full Vent support  . No maint IVFs . > 10L positive .   02/16 0701 - 02/17 0700 In: 585.8 [I.V.:371.2; IV Piggyback:69.5] Out: 1440 [ZOXWR:6045]  Filed Weights   11/04/18 2311  Weight: 136.1 kg    Scheduled Meds: . sodium chloride   Intravenous Once  . aspirin  81 mg Oral Daily  . atorvastatin  80 mg Oral q1800  . chlorhexidine gluconate (MEDLINE KIT)  15 mL Mouth Rinse BID  . Chlorhexidine Gluconate Cloth  6 each Topical Daily  . mouth rinse  15 mL Mouth Rinse 10 times per day  . pantoprazole (PROTONIX) IV  40 mg Intravenous QHS  . sodium chloride flush  10-40 mL Intracatheter Q12H   Continuous Infusions: . sodium chloride    . dexmedetomidine 0.5 mcg/kg/hr (11/07/18 0800)   PRN Meds:.sodium chloride, acetaminophen **OR** acetaminophen, bisacodyl, fentaNYL, hydrALAZINE, metoprolol tartrate, ondansetron, phenol, sodium chloride flush  Current Labs: reviewed    Physical Exam:  Blood pressure (!) 155/71, pulse 89, temperature (!) 97.5 F (36.4 C), temperature source Core, resp. rate (!) 21, height _0  (1.93 m), weight 136.1 kg, SpO2 97 %. Intubated, sedated ETT in place Regular, nl s1s2 Coarse Bs b/l No sig LEE Midline incision bandaged No rashes  A 1. AKI, admit SCr 1/38; 2/2 ATN from #2 + #3 + contrast exposrue 2. S/p AAA rupture and repair 2/15 3. Cardiac Arrest at time of presentation 4. Acute sCHF LVEF 25% 5. Hx/o HTN, on ACEi as outpt 6. VDRF 7. Hyperphosphatemia  P . No RRT indications.   [name] Kitchen BID labs for now . Follow P for now . WIll cont to follow closely  Pearson Grippe MD 11/07/2018, 9:08 AM  Recent Labs  Lab 11/05/18 1207 11/06/18 0430  11/06/18 1232 11/07/18 0429 11/07/18 0432  NA 144 145   < > 143 143 145  K 4.7 5.1   < > 4.5 4.5 4.5   CL 111 110  --   --   --  108  CO2 22 24  --   --   --  21*  GLUCOSE 146* 144*  --   --   --  138*  BUN 21 40*  --   --   --  63*  CREATININE 2.32* 4.59*  --   --   --  5.60*  CALCIUM 7.8* 7.6*  --   --   --  7.8*  PHOS  --   --   --   --   --  7.2*   < > = values in this interval not displayed.   Recent Labs  Lab 11/04/18 2330  11/05/18 0829  11/06/18 0430  11/06/18 1232 11/07/18 0429 11/07/18 0432  WBC 8.7   < > 12.2*  --  13.2*  --   --   --  13.9*  NEUTROABS 5.5  --  9.1*  --   --   --   --   --  11.9*  HGB 15.5   < > 16.1   < > 14.9   < > 13.3 13.3 14.3  HCT 48.7   < > 47.1   < > 43.5   < > 39.0 39.0 42.5  MCV 98.0   < > 85.2  --  86.3  --   --   --  88.0  PLT PLATELET CLUMPS NOTED ON SMEAR, COUNT APPEARS ADEQUATE   < > 102*  --  81*  --   --   --  75*   < > = values in this interval not displayed.

## 2018-11-07 NOTE — Procedures (Signed)
Extubation Procedure Note  Patient Details:   Name: Robert [name] DOB: 1956/04/28 MRN: [id number]   Airway Documentation:    Vent end date: 11/07/18 Vent end time: 1515   Evaluation  O2 sats: stable throughout Complications: No apparent complications Patient did tolerate procedure well. Bilateral Breath Sounds: Clear, Diminished   Yes   Patient was extubated to a 5L [place] without any complications, dyspnea or stridor noted. Patient was instructed on IS x 5, highest goal achieved was 786mL. Positive cuff leak.  Rowan Pollman, Eddie North 11/07/2018, 3:15 PM

## 2018-11-08 ENCOUNTER — Inpatient Hospital Stay (HOSPITAL_COMMUNITY): Payer: 59

## 2018-11-08 DIAGNOSIS — I10 Essential (primary) hypertension: Secondary | ICD-10-CM

## 2018-11-08 DIAGNOSIS — I469 Cardiac arrest, cause unspecified: Secondary | ICD-10-CM

## 2018-11-08 DIAGNOSIS — N179 Acute kidney failure, unspecified: Secondary | ICD-10-CM

## 2018-11-08 LAB — CBC WITH DIFFERENTIAL/PLATELET
Abs Immature Granulocytes: 0.07 10*3/uL (ref 0.00–0.07)
BASOS ABS: 0 10*3/uL (ref 0.0–0.1)
Basophils Relative: 0 %
Eosinophils Absolute: 0 10*3/uL (ref 0.0–0.5)
Eosinophils Relative: 0 %
HCT: 41.4 % (ref 39.0–52.0)
Hemoglobin: 13.5 g/dL (ref 13.0–17.0)
Immature Granulocytes: 1 %
Lymphocytes Relative: 7 %
Lymphs Abs: 0.9 10*3/uL (ref 0.7–4.0)
MCH: 29 pg (ref 26.0–34.0)
MCHC: 32.6 g/dL (ref 30.0–36.0)
MCV: 89 fL (ref 80.0–100.0)
Monocytes Absolute: 0.6 10*3/uL (ref 0.1–1.0)
Monocytes Relative: 5 %
Neutro Abs: 11 10*3/uL — ABNORMAL HIGH (ref 1.7–7.7)
Neutrophils Relative %: 87 %
Platelets: 92 10*3/uL — ABNORMAL LOW (ref 150–400)
RBC: 4.65 MIL/uL (ref 4.22–5.81)
RDW: 16.9 % — ABNORMAL HIGH (ref 11.5–15.5)
WBC: 12.7 10*3/uL — AB (ref 4.0–10.5)
nRBC: 0.2 % (ref 0.0–0.2)

## 2018-11-08 LAB — BASIC METABOLIC PANEL
Anion gap: 11 (ref 5–15)
BUN: 57 mg/dL — ABNORMAL HIGH (ref 8–23)
CO2: 25 mmol/L (ref 22–32)
CREATININE: 2.57 mg/dL — AB (ref 0.61–1.24)
Calcium: 8.6 mg/dL — ABNORMAL LOW (ref 8.9–10.3)
Chloride: 112 mmol/L — ABNORMAL HIGH (ref 98–111)
GFR calc Af Amer: 30 mL/min — ABNORMAL LOW (ref >60)
GFR calc non Af Amer: 26 mL/min — ABNORMAL LOW (ref >60)
Glucose, Bld: 114 mg/dL — ABNORMAL HIGH (ref 70–99)
Potassium: 3.9 mmol/L (ref 3.5–5.1)
Sodium: 148 mmol/L — ABNORMAL HIGH (ref 135–145)

## 2018-11-08 LAB — PHOSPHORUS: Phosphorus: 4 mg/dL (ref 2.5–4.6)

## 2018-11-08 LAB — MAGNESIUM: Magnesium: 2.4 mg/dL (ref 1.7–2.4)

## 2018-11-08 MED ORDER — CARVEDILOL 6.25 MG PO TABS
6.2500 mg | ORAL_TABLET | Freq: Two times a day (BID) | ORAL | Status: DC
  Administered 2018-11-08 – 2018-11-09 (×2): 6.25 mg via ORAL
  Filled 2018-11-08 (×2): qty 1

## 2018-11-08 MED ORDER — AMLODIPINE BESYLATE 10 MG PO TABS
10.0000 mg | ORAL_TABLET | Freq: Every day | ORAL | Status: DC
  Administered 2018-11-08 – 2018-11-11 (×4): 10 mg via ORAL
  Filled 2018-11-08 (×4): qty 1

## 2018-11-08 MED FILL — Sodium Chloride Irrigation Soln 0.9%: Qty: 6000 | Status: AC

## 2018-11-08 MED FILL — Sodium Chloride IV Soln 0.9%: INTRAVENOUS | Qty: 2000 | Status: AC

## 2018-11-08 MED FILL — Heparin Sodium (Porcine) Inj 1000 Unit/ML: INTRAMUSCULAR | Qty: 30 | Status: AC

## 2018-11-08 NOTE — Progress Notes (Addendum)
NAME:  Robert [name], MRN:  [id number], DOB:  10/21/1955, LOS: 3 ADMISSION DATE:  11/04/2018, CONSULTATION DATE:  2.15.2020 REFERRING MD:  Oneida Alar, MD CHIEF COMPLAINT:  Post-operative AAA-ruptured  Brief History   62 y.o.male,presented to [facility] med center with abdominal pain. Pain became severe was there he was diagnosed with a ruptured abdominal aortic aneurysm. Became syncopal and pulseless requiring a  brief course of CPR at [facility].Was not intubated at the time.  Transferred by EMS directly to the operating room at [facility]. The patient was awake on arrival to the hospital and complaining of abdominal pain. Known abdominal aortic aneurysm and scheduled for follow-up in December 2019 but did not follow up.  In OR he had a significant amount of blood loss estimated at 1400 cc. Received multiple units of blood, platelets and FFP. Intubated with glidescope without difficulty. Hypotensive initially, now off pressors. Given paralyzing agent and sedation prior to arrival to unit. Had left subclavian CVC and right radial arterial catheter placed. No vascular reanastamosis. Had graft placed infrarenal. Bowel appeared "purple" but improved at end of case. No bowel perforation or enterotomy.  Has history of AAA and renal artery stenosis requiring angioplasty. Found to have ECG changes post-operatively this morning and stat ECHO shows global reduction in EF of 20%.  Interim history/subjective:  Extubated 2/17>> OOB in chair on 5 L [place]  Past Medical History  AAA - ruptured 2/14  Fibromuscular dysphasia of renal artery s/p angioplasty to both renal arteries  HLD HTN  Hernia repair   Significant Hospital Events   2/14  Admit with ruptured AAA, brief CPR in ER.  OR details as above.   2/16  Following commands, on precedex.  AKI 2/17  Extubated to [place] 5 L  Consults:  CCM Cardiology Nephrology  Procedures:  Repair of AAA 2/15  TLC 2/14 >>  Arterial Line 2/14 >>  ETT 2/14 >>    Significant Diagnostic Tests:  CTA Chest/Abd/Pelvis 2/14 >> known fusiform infrarenal abdominal aortic aneurysm measuring 5 cm in AP diameter with evidence of acute rupture with moderate adjacent periaortic / retroperitoneal hemorrhage.  Rupture is anteriorly just below the takeoff of the inferior mesenteric artery ECHO 2/15 >> LVEF 20-25%, moderately increased LV wall thickness, RV moderately reduced systolic function, aortic root and ascending aorta normal in size, no TR doppler jet to estimate PA systolic pressure US Renal 2/15 >> normal bilateral renal ultrasound  Antimicrobials:    Objective   Blood pressure (!) 185/96, pulse 98, temperature 98.5 F (36.9 C), temperature source Oral, resp. rate (!) 27, height 6\' 4"  (1.93 m), weight (!) 145.2 kg, SpO2 98 %. CVP:  [4 mmHg-16 mmHg] 4 mmHg      Intake/Output Summary (Last 24 hours) at 11/08/2018 1230 Last data filed at 11/08/2018 0900 Gross per 24 hour  Intake 793.51 ml  Output 2385 ml  Net -1591.49 ml   Filed Weights   11/04/18 2311 11/08/18 0500  Weight: 136.1 kg (!) 145.2 kg   Examination: General: Adult male OOB in chair on 5 L [place] in NAD HEENT: NCAT, MM pink/moist, Nasal cannula Neuro: Awake, alert and oriented x 3, MAE x 4 CV: s1s2 rrr, no m/r/g PULM: Bilateral chest excursion, even/non-labored, lungs bilaterally clear , diminished per bases GI: abd soft, midline abd dressing clean/dry/intact , obese Extremities: warm/dry, 1+ edema , no obvious deformities, brisk capillary refill Skin: no rashes or lesions, warm and dry  Resolved Hospital Problem list   Hemorrhagic Shock  Lactic Acidosis   Assessment & Plan:   Ruptured AAA s/p Repair  P: Post operative recommendations per VVS  NPO post surgery > await VVS approval  Will need swallow evaluation   Cardiac Arrest STEMI  Acute sCHF / Cardiomyopathy - concern for global dysfunction related to hemorrhagic shock Hx HTN - on ACE-I as outpatient  P: Cardiology  following Tele monitoring  Will ultimately need L/RHC > priors have been clean and no mention of atherosclerotic disease on CT  PRN Hydralazine, NTG if needed for BP control   Acute Hypoxic Respiratory Failure  -in setting of ruptured AAA, cardiac arrest  Extubated 2/17 Doing well on 5 L [place] P: Wean oxygen  for sats  > 94% Aggressive pulmonary toilet Mobilize, OOB to chair IS Q 1 while awake Follow intermittent CXR  Hold diuresis given AKI   AKI Fibromuscular Dyplasia s/p Bilateral Renal Angioplasty (2006) P: Appreciate Nephrology Assist Trend BMP / urinary output Replace electrolytes as indicated Avoid nephrotoxic agents, ensure adequate renal perfusion KVO IVF  Renal dose medications    Electrolyte Disturbance: hyperphosphatemia  P: Monitor lytes, replace as indicated   At Risk Malnutrition  P: NPO for now Consider PO diet  > await ok from VVS   PCCM will sign off as patient is extubated , doing well and transferring out of the ICU. Please re-consult if we can be of any further assistance in Mr. Langenbach's care.   Best practice:  Diet: NPO per surgery Pain/Anxiety/Delirium protocol (if indicated): Not indicated VAP protocol (if indicated): Yes DVT prophylaxis: Chemical DVT On hold per surgery. SCD's GI prophylaxis: Protonix Glucose control:Trend   Mobility: as tolerated / OOB Code Status: Full Family Communication: Wife updated at bedside 2/18 Disposition: OK for transfer  Labs   CBC: Recent Labs  Lab 11/04/18 2330  11/05/18 0737 11/05/18 0829  11/06/18 0430 11/06/18 0448 11/06/18 1232 11/07/18 0429 11/07/18 0432 11/08/18 0500  WBC 8.7  --  12.9* 12.2*  --  13.2*  --   --   --  13.9* 12.7*  NEUTROABS 5.5  --   --  9.1*  --   --   --   --   --  11.9* 11.0*  HGB 15.5   < > 16.3 16.1   < > 14.9 13.9 13.3 13.3 14.3 13.5  HCT 48.7   < > 46.4 47.1   < > 43.5 41.0 39.0 39.0 42.5 41.4  MCV 98.0  --  86.1 85.2  --  86.3  --   --   --  88.0 89.0  PLT  PLATELET CLUMPS NOTED ON SMEAR, COUNT APPEARS ADEQUATE   < > 95* 102*  --  81*  --   --   --  75* 92*   < > = values in this interval not displayed.    Basic Metabolic Panel: Recent Labs  Lab 11/05/18 0737 11/05/18 0829 11/05/18 1207 11/06/18 0430 11/06/18 0448 11/06/18 1232 11/07/18 0429 11/07/18 0432 11/07/18 1441 11/08/18 0500  NA 145 144 144 145 144 143 143 145 144  --   K 5.2* 5.1 4.7 5.1 5.1 4.5 4.5 4.5 4.3  --   CL 112* 112* 111 110  --   --   --  108 108  --   CO2 22 21* 22 24  --   --   --  21* 22  --   GLUCOSE 123* 160* 146* 144*  --   --   --  138* 131*  --  BUN 16 18 21  40*  --   --   --  63* 68*  --   CREATININE 1.54* 1.76* 2.32* 4.59*  --   --   --  5.60* 5.23*  --   CALCIUM 8.3* 8.1* 7.8* 7.6*  --   --   --  7.8* 8.1*  --   MG 1.6*  --   --  2.1  --   --   --  2.2  --  2.4  PHOS  --   --   --   --   --   --   --  7.2*  --  4.0   GFR: Estimated Creatinine Clearance: 23.1 mL/min (A) (by C-G formula based on SCr of 5.23 mg/dL (H)). Recent Labs  Lab 11/05/18 0829  11/05/18 1446 11/05/18 1814 11/05/18 2005 11/06/18 0430 11/06/18 1441 11/07/18 0432 11/08/18 0500  WBC 12.2*  --   --   --   --  13.2*  --  13.9* 12.7*  LATICACIDVEN 4.2*   < > 3.9* 4.1* 4.3*  --  2.7*  --   --    < > = values in this interval not displayed.    Liver Function Tests: Recent Labs  Lab 11/05/18 0829 11/06/18 0430 11/07/18 0432  AST 61* 145* 66*  ALT 25 49* 19  ALKPHOS 38 44 48  BILITOT 2.0* 0.7 1.0  PROT 4.2* 5.3* 5.6*  ALBUMIN 2.5* 2.8* 2.6*   Recent Labs  Lab 11/06/18 0430  AMYLASE 193*   Recent Labs  Lab 11/05/18 0834  AMMONIA 39*    ABG    Component Value Date/Time   PHART 7.451 (H) 11/07/2018 0429   PCO2ART 32.7 11/07/2018 0429   PO2ART 83.0 11/07/2018 0429   HCO3 22.9 11/07/2018 0429   TCO2 24 11/07/2018 0429   ACIDBASEDEF 1.0 11/07/2018 0429   O2SAT 97.0 11/07/2018 0429     Coagulation Profile: Recent Labs  Lab 11/05/18 0312 11/05/18 0446  11/05/18 0829  INR 1.57 1.48 1.43    Cardiac Enzymes: Recent Labs  Lab 11/04/18 2330 11/05/18 0829 11/05/18 1446 11/05/18 2005 11/06/18 1108  TROPONINI 0.04* 2.02* 5.71* 5.23* 1.00*    HbA1C: Hgb A1c MFr Bld  Date/Time Value Ref Range Status  11/05/2018 08:34 AM 5.5 4.8 - 5.6 % Final    Comment:    (NOTE)         Prediabetes: 5.7 - 6.4         Diabetes: >6.4         Glycemic control for adults with diabetes: <7.0     CBG: No results for input(s): GLUCAP in the last 168 hours.  Critical care time: 30 minutes    Magdalen Spatz, AGACNP-BC [place] Pager # [phone] After 4 pm please page [phone] 11/08/2018, 12:30 PM

## 2018-11-08 NOTE — Progress Notes (Signed)
SLP Cancellation Note  Patient Details Name: Robert [name] MRN: [id number] DOB: 06-07-1956   Cancelled treatment:       Reason Eval/Treat Not Completed: Patient not medically ready. We await medical clearance to start POs. Will f/u as able.   [name] 11/08/2018, 9:50 AM  [name], M.A. [place] Acute Environmental education officer [phone] Office [phone]

## 2018-11-08 NOTE — Progress Notes (Signed)
[facility]  Subjective  - wants to know when he can go back to work   Objective (!) 185/96 98 98.5 F (36.9 C) (Oral) (!) 27 98%  Intake/Output Summary (Last 24 hours) at 11/08/2018 1223 Last data filed at 11/08/2018 0900 Gross per 24 hour  Intake 793.51 ml  Output 2385 ml  Net -1591.49 ml   Abdomen incision clean Feet pink warm  Assessment/Planning: Hypertension being addressed by Cardiology  Renal failure followed by Nephrology having some recovery.  Keep foley for now  Rupture AAA incision healing ileus resolving small amount of clears today less than 500 ml  Transfer 4e  Ruta Hinds 11/08/2018 12:23 PM --  Laboratory Lab Results: Recent Labs    11/07/18 0432 11/08/18 0500  WBC 13.9* 12.7*  HGB 14.3 13.5  HCT 42.5 41.4  PLT 75* 92*   BMET Recent Labs    11/07/18 0432 11/07/18 1441  NA 145 144  K 4.5 4.3  CL 108 108  CO2 21* 22  GLUCOSE 138* 131*  BUN 63* 68*  CREATININE 5.60* 5.23*  CALCIUM 7.8* 8.1*    COAG Lab Results  Component Value Date   INR 1.43 11/05/2018   INR 1.48 11/05/2018   INR 1.57 11/05/2018   No results found for: PTT

## 2018-11-08 NOTE — Progress Notes (Signed)
Admit: 11/04/2018 LOS: 3  55M s/p AAA rupture repaired 2/15 with AKI (Admit SCr 1.38) from ATN  Subjective:  . Slightly improved serum creatinine yesterday afternoon, morning labs pending . 2.5 L urine output . Extubated successfully, in chair this morning, conversant . Blood pressures have been elevated . On LR at 50 ml/hour . Amlodipine and carvedilol initiated by cardiology earlier this morning  02/17 0701 - 02/18 0700 In: 794.5 [I.V.:774.5] Out: 2575 [Urine:2575]  Filed Weights   11/04/18 2311 11/08/18 0500  Weight: 136.1 kg (!) 145.2 kg    Scheduled Meds: . aspirin  81 mg Oral Daily  . atorvastatin  80 mg Oral q1800  . Chlorhexidine Gluconate Cloth  6 each Topical Daily  . mouth rinse  15 mL Mouth Rinse BID  . pantoprazole (PROTONIX) IV  40 mg Intravenous QHS  . sodium chloride flush  10-40 mL Intracatheter Q12H   Continuous Infusions: . sodium chloride    . lactated ringers 50 mL/hr at 11/08/18 0700   PRN Meds:.sodium chloride, acetaminophen **OR** acetaminophen, bisacodyl, fentaNYL, hydrALAZINE, metoprolol tartrate, morphine injection, ondansetron, phenol, sodium chloride flush  Current Labs: reviewed    Physical Exam:  Blood pressure (!) 161/91, pulse (!) 102, temperature 99.1 F (37.3 C), temperature source Oral, resp. rate (!) 24, height 6\' 4"  (1.93 m), weight (!) 145.2 kg, SpO2 90 %. Intubated, sedated ETT in place Regular, nl s1s2 Coarse Bs b/l No sig LEE Midline incision bandaged No rashes  A 1. Nonoliguric AKI, admit SCr 1/38; 2/2 ATN from #2 + #3 + contrast exposrue 2. S/p AAA rupture and repair 2/15 3. Cardiac Arrest at time of presentation 4. Acute sCHF LVEF 25% 5. Hx/o HTN, on ACEi as outpt; uncontrolled 6. VDRF, extubated 2/17; resolved 7. Hyperphosphatemia  P . Strong suggestion of recovering GFR, continue to follow; doubt will need dialysis . Agree with hypertensive regimen . Expect phosphorus to improve with recover GFR . WIll cont to  follow closely  Pearson Grippe MD 11/08/2018, 9:13 AM  Recent Labs  Lab 11/06/18 0430  11/07/18 0429 11/07/18 0432 11/07/18 1441 11/08/18 0500  NA 145   < > 143 145 144  --   K 5.1   < > 4.5 4.5 4.3  --   CL 110  --   --  108 108  --   CO2 24  --   --  21* 22  --   GLUCOSE 144*  --   --  138* 131*  --   BUN 40*  --   --  63* 68*  --   CREATININE 4.59*  --   --  5.60* 5.23*  --   CALCIUM 7.6*  --   --  7.8* 8.1*  --   PHOS  --   --   --  7.2*  --  4.0   < > = values in this interval not displayed.   Recent Labs  Lab 11/05/18 0829  11/06/18 0430  11/07/18 0429 11/07/18 0432 11/08/18 0500  WBC 12.2*  --  13.2*  --   --  13.9* 12.7*  NEUTROABS 9.1*  --   --   --   --  11.9* 11.0*  HGB 16.1   < > 14.9   < > 13.3 14.3 13.5  HCT 47.1   < > 43.5   < > 39.0 42.5 41.4  MCV 85.2  --  86.3  --   --  88.0 89.0  PLT 102*  --  81*  --   --  75* 92*   < > = values in this interval not displayed.

## 2018-11-08 NOTE — Evaluation (Signed)
Occupational Therapy Evaluation Patient Details Name: Robert [name] MRN: [id number] DOB: 08-09-1956 Today's Date: 11/08/2018    History of Present Illness Pt adm with ruptured AAA and underwent emergent repair on 2/15. Pt with AKI which is improving . PMH - cardiac stent, rt partial knee replacement.   Clinical Impression   Patient is s/p see above surgery resulting in functional limitations due to the deficits listed below (see OT problem list). Pt currently min guard (A) with basic transfer however mod (A) for LB. Pt will needed continued education LB ae and energy conservation.  Patient will benefit from skilled OT acutely to increase independence and safety with ADLS to allow discharge hhot pending progress.     Follow Up Recommendations  Home health OT    Equipment Recommendations  Other (comment)(RW bariatric pending progress)    Recommendations for Other Services       Precautions / Restrictions Precautions Precautions: None Precaution Comments: avoid pulling / pushing with UE as much as possible use of heart pillow for coughing      Mobility Bed Mobility               General bed mobility comments: up in chair due to pt reports bed is too short even in extended form  Transfers Overall transfer level: Needs assistance Equipment used: Rolling walker (2 wheeled) Transfers: Sit to/from Stand Sit to Stand: Min guard         General transfer comment: educated on scoot hips to [place] of chair and then powering up    Balance Overall balance assessment: No apparent balance deficits (not formally assessed)                                         ADL either performed or assessed with clinical judgement   ADL Overall ADL's : Needs assistance/impaired Eating/Feeding: Modified independent Eating/Feeding Details (indicate cue type and reason): ice chips Grooming: Set up;Sitting   Upper Body Bathing: Moderate assistance   Lower Body  Bathing: Moderate assistance Lower Body Bathing Details (indicate cue type and reason): able to figure 4 cross L LE only       Lower Body Dressing Details (indicate cue type and reason): reports prior to event having shot in R hip due to pain . pt reports inability to cross R LE due to hip pain. will need further education on LB ae Toilet Transfer: Min Psychiatric nurse Details (indicate cue type and reason): requires bil UE on chair simulated transfer   Toileting - Clothing Manipulation Details (indicate cue type and reason): reports unable to complete early today. educated on toilet tongs but not demonstrated.     Functional mobility during ADLs: Min guard;Rolling walker(o2 ) General ADL Comments: pt requires RW for balance at this time.      Vision         Perception     Praxis      Pertinent Vitals/Pain Pain Assessment: Faces Faces Pain Scale: Hurts little more Pain Location: abdomen Pain Descriptors / Indicators: Grimacing;Guarding Pain Intervention(s): Monitored during session;Repositioned     Hand Dominance Right   Extremity/Trunk Assessment Upper Extremity Assessment Upper Extremity Assessment: Overall WFL for tasks assessed   Lower Extremity Assessment Lower Extremity Assessment: Overall WFL for tasks assessed   Cervical / Trunk Assessment Cervical / Trunk Assessment: Other exceptions(reports pain in chest area) Cervical / Trunk Exceptions: pt reports  decr ability to abduct and interally rotate for peri care   Communication Communication Communication: No difficulties   Cognition Arousal/Alertness: Awake/alert Behavior During Therapy: WFL for tasks assessed/performed Overall Cognitive Status: Within Functional Limits for tasks assessed                                     General Comments  BP elevated 180s / 100s .  pt with stable elevated pressure sitting vs after transfer. 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 due to only works on [employer]      Prior Functioning/Environment Level of Independence: Independent        Comments: works        OT Problem List: Decreased strength;Decreased activity tolerance;Impaired balance (sitting and/or standing);Decreased knowledge of use of DME or AE;Decreased knowledge of precautions;Cardiopulmonary status limiting activity;Obesity;Pain      OT Treatment/Interventions: Self-care/ADL training;Therapeutic exercise;Neuromuscular education;Energy conservation;DME and/or AE instruction;Manual therapy;Modalities;Therapeutic activities;Patient/family education;Balance training    OT Goals(Current goals can be found in the care plan section) Acute Rehab OT Goals Patient Stated Goal: go home OT Goal Formulation: With patient/family Time For Goal Achievement: 11/29/18 Potential to Achieve Goals: Good  OT Frequency: Min 3X/week   Barriers to D/C:            Co-evaluation              AM-PAC OT "6 Clicks" Daily Activity     Outcome Measure Help from another person eating meals?: None Help from another person taking care of personal grooming?: A Little Help from another person toileting, which includes using toliet, bedpan, or urinal?: A Lot Help from another person bathing (including washing, rinsing, drying)?: A Lot Help from another person to put on and taking off regular upper body clothing?: A Little Help from another person to put on and taking off regular lower  body clothing?: A Lot 6 Click Score: 16   End of Session Equipment Utilized During Treatment: Rolling walker;Oxygen Nurse Communication: Mobility status;Precautions  Activity Tolerance: Patient tolerated treatment well Patient left: in chair;with call bell/phone within reach;with family/visitor present  OT Visit Diagnosis: Unsteadiness on feet (R26.81);Muscle weakness (generalized) (M62.81)                Time: 5009-3818 OT Time Calculation (min): 28 min Charges:  OT General Charges $OT Visit: 1 Visit OT Evaluation $OT Eval Moderate Complexity: 1 Mod OT Treatments $Self Care/Home Management : 8-22 mins   Jeri Modena, OTR/L  Acute Rehabilitation Services Pager: [phone] Office: [phone] .   Jeri Modena 11/08/2018, 1:52 PM

## 2018-11-08 NOTE — Progress Notes (Signed)
Nutrition Follow-up  DOCUMENTATION CODES:   Obesity unspecified  INTERVENTION:   - Follow for diet advancement and supplement as appropriate  NUTRITION DIAGNOSIS:   Inadequate oral intake related to inability to eat as evidenced by NPO status.  Ongoing  GOAL:   Patient will meet greater than or equal to 90% of their needs  Unmet  MONITOR:   Diet advancement, I & O's, Skin, Weight trends, Labs  REASON FOR ASSESSMENT:   Ventilator    ASSESSMENT:    63 y.o. male with medical hx of HTN and hyperlipidemia. He presented to [facility] and diagnosed with a ruptured abdominal aortic aneurysm. He required a brief course of CPR at [facility].  He was then transferred by ambulance directly to the OR at [facility]. He was awake on arrival to the OR.  He had a known abdominal aortic aneurysm and was scheduled for follow-up in December 2019 but did not show.  2/15 - s/p emergent operative repair of ruptured AAA 2/17 - extubated 2/18 - NGT removed  Pt awaiting swallow evaluation. Per vascular surgery, ileus resolving and pt can have a small amount of clears today.  Pt taking some ice chips at time of visit. States he hopes to advance his diet slowly to see how he feels. Pt denies any abdominal pain, nausea. Abdomen taut.  Medications reviewed and include: Protonix IVF: LR @ 50 ml/hr  Labs reviewed: BUN 68 (H), creatinine 5.23 (H)  UOP: 2575 ml x 24 hours I/O's: +8.7 L since admit  Diet Order:   Diet Order            Diet NPO time specified  Diet effective now              EDUCATION NEEDS:   Not appropriate for education at this time  Skin:  Skin Assessment: Skin Integrity Issues: Incisions: abdominal (2/15)  Last BM:  2/18 large type 4  Height:   Ht Readings from Last 1 Encounters:  11/04/18 6\' 4"  (1.93 m)    Weight:   Wt Readings from Last 1 Encounters:  11/08/18 (!) 145.2 kg    Ideal Body Weight:  91.82 kg  BMI:  Body mass index is  38.96 kg/m.  Estimated Nutritional Needs:   Kcal:  2703-5009  Protein:  120-135 grams  Fluid:  >/= 2 L/day    Gaynell Face, MS, RD, LDN Inpatient Clinical Dietitian Pager: [phone] Weekend/After Hours: [phone]

## 2018-11-08 NOTE — Progress Notes (Signed)
Progress Note  Patient Name: Robert [name] Date of Encounter: 11/08/2018  Primary Cardiologist: Dr. Aundra Dubin  Subjective   Extubated yesterday; alert, no chest pain  Inpatient Medications    Scheduled Meds: . aspirin  81 mg Oral Daily  . atorvastatin  80 mg Oral q1800  . Chlorhexidine Gluconate Cloth  6 each Topical Daily  . mouth rinse  15 mL Mouth Rinse BID  . pantoprazole (PROTONIX) IV  40 mg Intravenous QHS  . sodium chloride flush  10-40 mL Intracatheter Q12H   Continuous Infusions: . sodium chloride    . lactated ringers 50 mL/hr at 11/08/18 0700   PRN Meds: sodium chloride, acetaminophen **OR** acetaminophen, bisacodyl, fentaNYL, hydrALAZINE, metoprolol tartrate, morphine injection, ondansetron, phenol, sodium chloride flush   Vital Signs    Vitals:   11/08/18 0500 11/08/18 0600 11/08/18 0700 11/08/18 0835  BP: (!) 161/91     Pulse: 92 (!) 102    Resp: (!) 22 (!) 25 (!) 24   Temp:    99.1 F (37.3 C)  TempSrc:    Oral  SpO2: 96% 90%    Weight: (!) 145.2 kg     Height:        Intake/Output Summary (Last 24 hours) at 11/08/2018 0859 Last data filed at 11/08/2018 0700 Gross per 24 hour  Intake 760.85 ml  Output 2525 ml  Net -1764.15 ml    I/O since admission: +8806  [facility] Weights   11/04/18 2311 11/08/18 0500  Weight: 136.1 kg (!) 145.2 kg    Telemetry    Sinus at 00 - Personally Reviewed  ECG   F/U 2/15 ECG (independently read by me): NSR with improvement in STE and residual T changes inferolaterally   2/15 ECG (independently read by me): NST 82; STE V2-3 inferior T changes  Physical Exam   BP (!) 161/91   Pulse (!) 102   Temp 99.1 F (37.3 C) (Oral)   Resp (!) 24   Ht 6\' 4"  (1.93 m)   Wt (!) 145.2 kg   SpO2 90%   BMI 38.96 kg/m  General: Alert, oriented, no distress.  Skin: normal turgor, no rashes, warm and dry HEENT: Normocephalic, atraumatic. Pupils equal round and reactive to light; sclera anicteric; extraocular muscles  intact;  Nose without nasal septal hypertrophy Mouth/Parynx benign; Mallinpatti  3 Neck: No JVD, no carotid bruits; normal carotid upstroke Lungs: clear to ausculatation and percussion; no wheezing or rales Chest wall: without tenderness to palpitation Heart: PMI not displaced, RRR in the upper 90s, s1 s2 normal, 1/6 systolic murmur, no diastolic murmur, no rubs, gallops, thrills, or heaves Abdomen: soft, nontender; no hepatosplenomehaly, BS+; abdominal aorta nontender and not dilated by palpation. Back: no CVA tenderness Pulses 2+ Musculoskeletal: full range of motion, normal strength, no joint deformities Extremities: no clubbing cyanosis or edema, Homan's sign negative  Neurologic: grossly nonfocal; Cranial nerves grossly wnl Psychologic: Normal mood and affect  Labs    Chemistry Recent Labs  Lab 11/05/18 0829  11/06/18 0430  11/07/18 0429 11/07/18 0432 11/07/18 1441  NA 144   < > 145   < > 143 145 144  K 5.1   < > 5.1   < > 4.5 4.5 4.3  CL 112*   < > 110  --   --  108 108  CO2 21*   < > 24  --   --  21* 22  GLUCOSE 160*   < > 144*  --   --  138* 131*  BUN 18   < > 40*  --   --  63* 68*  CREATININE 1.76*   < > 4.59*  --   --  5.60* 5.23*  CALCIUM 8.1*   < > 7.6*  --   --  7.8* 8.1*  PROT 4.2*  --  5.3*  --   --  5.6*  --   ALBUMIN 2.5*  --  2.8*  --   --  2.6*  --   AST 61*  --  145*  --   --  66*  --   ALT 25  --  49*  --   --  19  --   ALKPHOS 38  --  44  --   --  48  --   BILITOT 2.0*  --  0.7  --   --  1.0  --   GFRNONAA 41*   < > 13*  --   --  10* 11*  GFRAA 47*   < > 15*  --   --  12* 13*  ANIONGAP 11   < > 11  --   --  16* 14   < > = values in this interval not displayed.     Hematology Recent Labs  Lab 11/06/18 0430  11/07/18 0429 11/07/18 0432 11/08/18 0500  WBC 13.2*  --   --  13.9* 12.7*  RBC 5.04  --   --  4.83 4.65  HGB 14.9   < > 13.3 14.3 13.5  HCT 43.5   < > 39.0 42.5 41.4  MCV 86.3  --   --  88.0 89.0  MCH 29.6  --   --  29.6 29.0  MCHC  34.3  --   --  33.6 32.6  RDW 17.4*  --   --  17.4* 16.9*  PLT 81*  --   --  75* 92*   < > = values in this interval not displayed.    Cardiac Enzymes Recent Labs  Lab 11/05/18 0829 11/05/18 1446 11/05/18 2005 11/06/18 1108  TROPONINI 2.02* 5.71* 5.23* 1.00*   No results for input(s): TROPIPOC in the last 168 hours.   BNPNo results for input(s): BNP, PROBNP in the last 168 hours.   DDimer  Recent Labs  Lab 11/05/18 0312 11/05/18 0446  DDIMER >20.00* >20.00*     Lipid Panel  No results found for: CHOL, TRIG, HDL, CHOLHDL, VLDL, LDLCALC, LDLDIRECT   Radiology    Dg Abd 1 View  Result Date: 11/08/2018 CLINICAL DATA:  Encounter for feeding tube placement EXAM: ABDOMEN - 1 VIEW COMPARISON:  Three days ago FINDINGS: A gastric suction tube tip overlaps the stomach. A weighted feeding tube is not seen. Retrocardiac opacity and air bronchogram. IMPRESSION: Enteric tube tip over the stomach. Electronically Signed   By: Monte Fantasia M.D.   On: 11/08/2018 07:48   Dg Chest Port 1 View  Result Date: 11/07/2018 CLINICAL DATA:  Pulmonary edema.  Respiratory difficulty. EXAM: PORTABLE CHEST 1 VIEW COMPARISON:  11/06/2018 FINDINGS: Endotracheal tube, NG tube, left subclavian central venous catheter are stable. Normal heart size. Low lung volumes. Increased right basilar atelectasis. Stable left basilar consolidation. Stable left pleural effusion. No pneumothorax. IMPRESSION: Right basilar atelectasis is worse. Stable left basilar consolidation and left pleural effusion. Electronically Signed   By: Marybelle Killings M.D.   On: 11/07/2018 07:48   Vas US Renal Artery Duplex  Result Date: 11/06/2018 ABDOMINAL VISCERAL Indications: Acute episode on Chronic Renal  failure;              AAA rupture. Limitations: Air/bowel gas, obesity and Patient is in unconsciousness, unable to cooperate with breathing technic and immobility of body. Comparison Study: No comparison study available. Performing  Technologist: Rudell Cobb  Examination Guidelines: A complete evaluation includes B-mode imaging, spectral Doppler, color Doppler, and power Doppler as needed of all accessible portions of each vessel. Bilateral testing is considered an integral part of a complete examination. Limited examinations for reoccurring indications may be performed as noted.  Duplex Findings: +----------+--------+--------+------+--------+ MesentericPSV cm/sEDV cm/sPlaqueComments +----------+--------+--------+------+--------+ Aorta Mid   135      63                  +----------+--------+--------+------+--------+  +------------------+--------+--------+------------------------------+ Right Renal ArteryPSV cm/sEDV cm/s           Comment             +------------------+--------+--------+------------------------------+ Origin                            unable to see due to bowel gas +------------------+--------+--------+------------------------------+ Proximal             94      38                                  +------------------+--------+--------+------------------------------+ Mid                 149      55                                  +------------------+--------+--------+------------------------------+ Distal               50      23                                  +------------------+--------+--------+------------------------------+ +-----------------+--------+--------+------------------------------+ Left Renal ArteryPSV cm/sEDV cm/s           Comment             +-----------------+--------+--------+------------------------------+ Origin                           unable to see due to bowel gas +-----------------+--------+--------+------------------------------+ Proximal           158      59                                  +-----------------+--------+--------+------------------------------+ Mid                 57      22                                   +-----------------+--------+--------+------------------------------+ Distal              82      24                                  +-----------------+--------+--------+------------------------------+ +------------+--------+--------+----+-----------+--------+--------+----+ Right KidneyPSV cm/sEDV cm/sRI  Left KidneyPSV cm/sEDV cm/sRI   +------------+--------+--------+----+-----------+--------+--------+----+ Upper Pole  17      7       0.60Upper Pole 27      13      0.53 +------------+--------+--------+----+-----------+--------+--------+----+ Mid         24      11      0.54Mid        28      11      0.62 +------------+--------+--------+----+-----------+--------+--------+----+ Lower Pole  16      6       0.62Lower Pole 32      14      0.57 +------------+--------+--------+----+-----------+--------+--------+----+ Hilar       48      20      0.58Hilar      40      12      0.70 +------------+--------+--------+----+-----------+--------+--------+----+ +------------------+-----+------------------+-----+ Right Kidney           Left Kidney             +------------------+-----+------------------+-----+ RAR                    RAR                     +------------------+-----+------------------+-----+ RAR (manual)      1.10 RAR (manual)      1.17  +------------------+-----+------------------+-----+ Cortex                 Cortex                  +------------------+-----+------------------+-----+ Cortex thickness       Corex thickness         +------------------+-----+------------------+-----+ Kidney length (cm)13.90Kidney length (cm)14.10 +------------------+-----+------------------+-----+  Summary: Renal:  Right: No evidence of right renal artery stenosis. Normal right        Resisitive Index. RRV flow present. Left:  No evidence of left renal artery stenosis. Normal left        Resistive Index. LRV flow present. Mesenteric:  SMA and Celiac artery  can not be able to evaluate due to bowel gas and patient body habitus.  *See table(s) above for measurements and observations.  Diagnosing physician: Ruta Hinds MD  Electronically signed by Ruta Hinds MD on 11/06/2018 at 2:16:17 PM.    Final     Cardiac Studies   11/05/18 ECHO IMPRESSIONS   1. The left ventricle has severely reduced systolic function, with an ejection fraction of 20-25%. The cavity size was normal. There is moderately increased left ventricular wall thickness. Left ventricular diastolic Doppler parameters are consistent  with impaired relaxation Left ventricular diffuse hypokinesis.  2. The right ventricle has moderately reduced systolic function. The cavity was mildly enlarged. There is mildly increased right ventricular wall thickness.  3. The mitral valve is normal in structure. No evidence of mitral valve stenosis. No significant regurgitation.  4. The tricuspid valve is normal in structure.  5. The aortic valve is tricuspid no stenosis of the aortic valve.  6. The pulmonic valve was normal in structure.  7. The aortic root and ascending aorta are normal in size and structure.  8. No complete TR doppler jet so unable to estimate PA systolic pressure.  Patient Profile     Robert [name] is a 63 y.o. male with a hx of AAA, HTN, HLP, fibromuscular dysplasia s/p bilateral renal artery angioplasty in 2006 and underwent for AAA rupture  Assessment & Plan  1. S/p Ruptured AAA evening of 2/14 with emergent operative repair early  2/15 am  2. CPR with transient STE in [place] prior to transfer;  Resolution of STE on subsequent ECG. Will repeat ECG today.  3. Acute systolic heart failure;  EF 20 - 25% on echo; trop peak 5.71 with trending downward.  Diffuse hypokinesis probably contributed by transient shock/cardiac arrest  4. AKI  Cr increased to 5.6 seen by nephrology making urine. 2500 cc urine output yesterday, Cr turned the corner now 5.23;  hopefully recovering  5. HTN:  Continues to be elevated; now 185/96;   Home meds reviewed: olmesartan 40 mg, clonidine.  Will not  re-initiate with AKI.  Will start amlodipine 10 mg daily and with LV dysfunction coreg initally at 6.25 mg bid and probably titrated as BP and HR allow to 25 mg bid. PRN Hydralazine if needed.   Will ultimately need R and Left heart cath after recovery of renal function, Doubt acute MI and cardiomyopathy related to shock/hypoperfusion and cardiac arrest.      Signed, Troy Sine, MD, [facility] 11/08/2018, 8:59 AM

## 2018-11-08 NOTE — Evaluation (Signed)
Physical Therapy Evaluation Patient Details Name: Robert [name] MRN: [id number] DOB: 1956/06/28 Today's Date: 11/08/2018   History of Present Illness  Pt adm with ruptured AAA and underwent emergent repair on 2/15. Pt with AKI which is improving . PMH - cardiac stent, rt partial knee replacement.  Clinical Impression  Pt admitted with above diagnosis and presents to PT with functional limitations due to deficits listed below (See PT problem list). Pt needs skilled PT to maximize independence and safety to allow discharge to home with wife. Should make good progress and be able to return to prior living situation.      Follow Up Recommendations No PT follow up;Supervision - Intermittent    Equipment Recommendations  None recommended by PT    Recommendations for Other Services       Precautions / Restrictions Precautions Precautions: None      Mobility  Bed Mobility               General bed mobility comments: Pt up in chair  Transfers Overall transfer level: Needs assistance Equipment used: Rolling walker (2 wheeled) Transfers: Sit to/from Stand Sit to Stand: Min assist         General transfer comment: assist to bring hips up   Ambulation/Gait Ambulation/Gait assistance: Min guard Gait Distance (Feet): 200 Feet Assistive device: Rolling walker (2 wheeled) Gait Pattern/deviations: Step-through pattern;Decreased stride length;Trunk flexed Gait velocity: decr Gait velocity interpretation: >2.62 ft/sec, indicative of community ambulatory General Gait Details: assist for safety and lines. Pt with HR to 130. Verbal cues to stand more erect  Stairs            Wheelchair Mobility    Modified Rankin (Stroke Patients Only)       Balance Overall balance assessment: No apparent balance deficits (not formally assessed)                                           Pertinent Vitals/Pain Pain Assessment: Faces Faces Pain Scale: Hurts  little more Pain Location: abdomen Pain Descriptors / Indicators: Grimacing;Guarding Pain Intervention(s): Limited activity within patient's tolerance;Monitored during session    Home Living Family/patient expects to be discharged to:: Private residence Living Arrangements: Spouse/significant other Available Help at Discharge: Family Type of Home: House Home Access: Stairs to enter Entrance Stairs-Rails: None Technical brewer of Steps: 3   Home Equipment: Environmental consultant - 2 wheels      Prior Function Level of Independence: Independent         Comments: works     Journalist, newspaper        Extremity/Trunk Assessment   Upper Extremity Assessment Upper Extremity Assessment: Defer to OT evaluation    Lower Extremity Assessment Lower Extremity Assessment: Overall WFL for tasks assessed       Communication   Communication: No difficulties  Cognition Arousal/Alertness: Awake/alert Behavior During Therapy: WFL for tasks assessed/performed Overall Cognitive Status: Within Functional Limits for tasks assessed                                        General Comments      Exercises     Assessment/Plan    PT Assessment Patient needs continued PT services  PT Problem List Decreased activity tolerance;Decreased mobility;Pain       PT  Treatment Interventions DME instruction;Gait training;Stair training;Functional mobility training;Therapeutic activities;Therapeutic exercise;Patient/family education    PT Goals (Current goals can be found in the Care Plan section)  Acute Rehab PT Goals Patient Stated Goal: go home PT Goal Formulation: With patient Time For Goal Achievement: 11/22/18 Potential to Achieve Goals: Good    Frequency Min 3X/week   Barriers to discharge Inaccessible home environment stairs to enter    Co-evaluation               AM-PAC PT "6 Clicks" Mobility  Outcome Measure Help needed turning from your back to your side while  in a flat bed without using bedrails?: A Little Help needed moving from lying on your back to sitting on the side of a flat bed without using bedrails?: A Little Help needed moving to and from a bed to a chair (including a wheelchair)?: A Little Help needed standing up from a chair using your arms (e.g., wheelchair or bedside chair)?: A Little Help needed to walk in hospital room?: A Little Help needed climbing 3-5 steps with a railing? : A Little 6 Click Score: 18    End of Session Equipment Utilized During Treatment: Oxygen Activity Tolerance: Patient tolerated treatment well Patient left: in chair;with call bell/phone within reach;with family/visitor present Nurse Communication: Mobility status PT Visit Diagnosis: Other abnormalities of gait and mobility (R26.89);Pain Pain - part of body: (abdomen)    Time: 8638-1771 PT Time Calculation (min) (ACUTE ONLY): 22 min   Charges:   PT Evaluation $PT Eval Moderate Complexity: [place] Pager [phone] Office [place] 11/08/2018, 12:05 PM

## 2018-11-09 ENCOUNTER — Inpatient Hospital Stay (HOSPITAL_COMMUNITY): Payer: 59

## 2018-11-09 DIAGNOSIS — E785 Hyperlipidemia, unspecified: Secondary | ICD-10-CM

## 2018-11-09 LAB — COMPREHENSIVE METABOLIC PANEL
ALBUMIN: 2.7 g/dL — AB (ref 3.5–5.0)
ALT: 21 U/L (ref 0–44)
AST: 50 U/L — ABNORMAL HIGH (ref 15–41)
Alkaline Phosphatase: 54 U/L (ref 38–126)
Anion gap: 6 (ref 5–15)
BUN: 46 mg/dL — ABNORMAL HIGH (ref 8–23)
CO2: 28 mmol/L (ref 22–32)
Calcium: 8.7 mg/dL — ABNORMAL LOW (ref 8.9–10.3)
Chloride: 114 mmol/L — ABNORMAL HIGH (ref 98–111)
Creatinine, Ser: 1.89 mg/dL — ABNORMAL HIGH (ref 0.61–1.24)
GFR calc Af Amer: 43 mL/min — ABNORMAL LOW (ref >60)
GFR calc non Af Amer: 37 mL/min — ABNORMAL LOW (ref >60)
GLUCOSE: 107 mg/dL — AB (ref 70–99)
Potassium: 4.1 mmol/L (ref 3.5–5.1)
Sodium: 148 mmol/L — ABNORMAL HIGH (ref 135–145)
Total Bilirubin: 2 mg/dL — ABNORMAL HIGH (ref 0.3–1.2)
Total Protein: 5.8 g/dL — ABNORMAL LOW (ref 6.5–8.1)

## 2018-11-09 LAB — CBC WITH DIFFERENTIAL/PLATELET
Abs Immature Granulocytes: 0.11 10*3/uL — ABNORMAL HIGH (ref 0.00–0.07)
Basophils Absolute: 0 10*3/uL (ref 0.0–0.1)
Basophils Relative: 0 %
Eosinophils Absolute: 0.1 10*3/uL (ref 0.0–0.5)
Eosinophils Relative: 1 %
HEMATOCRIT: 40.3 % (ref 39.0–52.0)
Hemoglobin: 12.6 g/dL — ABNORMAL LOW (ref 13.0–17.0)
Immature Granulocytes: 1 %
Lymphocytes Relative: 8 %
Lymphs Abs: 1 10*3/uL (ref 0.7–4.0)
MCH: 29.1 pg (ref 26.0–34.0)
MCHC: 31.3 g/dL (ref 30.0–36.0)
MCV: 93.1 fL (ref 80.0–100.0)
Monocytes Absolute: 0.8 10*3/uL (ref 0.1–1.0)
Monocytes Relative: 7 %
Neutro Abs: 9.9 10*3/uL — ABNORMAL HIGH (ref 1.7–7.7)
Neutrophils Relative %: 83 %
Platelets: 103 10*3/uL — ABNORMAL LOW (ref 150–400)
RBC: 4.33 MIL/uL (ref 4.22–5.81)
RDW: 16.5 % — ABNORMAL HIGH (ref 11.5–15.5)
WBC: 12 10*3/uL — ABNORMAL HIGH (ref 4.0–10.5)
nRBC: 0.2 % (ref 0.0–0.2)

## 2018-11-09 LAB — MAGNESIUM: Magnesium: 2.3 mg/dL (ref 1.7–2.4)

## 2018-11-09 LAB — PHOSPHORUS: PHOSPHORUS: 2.9 mg/dL (ref 2.5–4.6)

## 2018-11-09 MED ORDER — CARVEDILOL 12.5 MG PO TABS
12.5000 mg | ORAL_TABLET | Freq: Two times a day (BID) | ORAL | Status: DC
  Administered 2018-11-09 – 2018-11-10 (×2): 12.5 mg via ORAL
  Filled 2018-11-09 (×2): qty 1

## 2018-11-09 NOTE — Evaluation (Signed)
Clinical/Bedside Swallow Evaluation Patient Details  Name: Robert [name] MRN: [id number] Date of Birth: 1955-10-31  Today's Date: 11/09/2018 Time: SLP Start Time (ACUTE ONLY): 4481 SLP Stop Time (ACUTE ONLY): 8563 SLP Time Calculation (min) (ACUTE ONLY): 9 min  Past Medical History:  Past Medical History:  Diagnosis Date  . AAA (abdominal aortic aneurysm, ruptured) ([place])   . Fibromuscular dysplasia of renal artery (HCC)    s/p angioplasty to both renal arteries in 2006  . History of cardiac catheterization    LHC in 2013:  no sig CAD  . Hyperlipidemia   . Hypertension    Past Surgical History:  Past Surgical History:  Procedure Laterality Date  . ABDOMINAL AORTIC ANEURYSM REPAIR N/A 11/05/2018   Procedure: ANEURYSM ABDOMINAL AORTIC REPAIR WITH HEMASHIELD GOLD VELOUR VASCULAR GRAFT;  Surgeon: Elam Dutch, MD;  Location: [facility] OR;  Service: Vascular;  Laterality: N/A;  . CARDIAC SURGERY  2006   stent placement  . HERNIA REPAIR    . INSERTION OF MESH  08/31/2012   Procedure: INSERTION OF MESH;  Surgeon: Imogene Burn. Georgette Dover, MD;  Location: [place];  Service: General;  Laterality: N/A;  . KNEE SURGERY  2009/2010   right  . KNEE SURGERY  2010   right knee partial replacement  . LEFT HEART CATHETERIZATION WITH CORONARY ANGIOGRAM N/A 05/19/2012   Procedure: LEFT HEART CATHETERIZATION WITH CORONARY ANGIOGRAM;  Surgeon: Pixie Casino, MD;  Location: [facility] CATH LAB;  Service: Cardiovascular;  Laterality: N/A;  . UMBILICAL HERNIA REPAIR  08/31/2012   Procedure: HERNIA REPAIR UMBILICAL ADULT;  Surgeon: Imogene Burn. Georgette Dover, MD;  Location: [place];  Service: General;  Laterality: N/A;  umbilical hernia repair with mesh   HPI:  Pt is a 63 yo male admitted with ruptured AAA and underwent emergent repair on 2/15. ETT 2/14-2/17. Pt with AKI which is improving. Hospital course also complicated by ileus. PMH - cardiac stent, rt partial knee replacement.    Assessment / Plan / Recommendation Clinical Impression  Pt's oropharyngeal swallow appears functional, and he is now two days post-extubation. His voice is mildly soft, but clear in quality. At this point he has clearance for a clear liquid diet per surgery and seems appropriate to continue this. SLP will f/u briefly as his diet is advanced to more solid textures by MD. SLP Visit Diagnosis: Dysphagia, unspecified (R13.10)    Aspiration Risk  Mild aspiration risk    Diet Recommendation Thin liquid(clear liquid diet per surgery)   Liquid Administration via: Cup;Straw Medication Administration: Whole meds with liquid Supervision: Patient able to self feed;Intermittent supervision to cue for compensatory strategies Compensations: Slow rate;Small sips/bites Postural Changes: Seated upright at 90 degrees    Other  Recommendations Oral Care Recommendations: Oral care BID   Follow up Recommendations None      Frequency and Duration min 1 x/week  1 week       Prognosis Prognosis for Safe Diet Advancement: Good      Swallow Study   General HPI: Pt is a 63 yo male admitted with ruptured AAA and underwent emergent repair on 2/15. ETT 2/14-2/17. Pt with AKI which is improving. Hospital course also complicated by ileus. PMH - cardiac stent, rt partial knee replacement. Type of Study: Bedside Swallow Evaluation Previous Swallow Assessment: none in chart Diet Prior to this Study: Thin liquids;Other (Comment)(clear liquid diet okay per surgeon) Temperature Spikes Noted: No Respiratory Status: Nasal cannula History of Recent Intubation: Yes Length of Intubations (  days): 3 days Date extubated: 11/07/18 Behavior/Cognition: Alert;Cooperative;Pleasant mood Oral Cavity Assessment: Within Functional Limits Oral Care Completed by SLP: No Oral Cavity - Dentition: Adequate natural dentition Vision: Functional for self-feeding Self-Feeding Abilities: Able to feed self Patient Positioning:  Upright in chair Baseline Vocal Quality: Low vocal intensity(mild) Volitional Cough: Strong Volitional Swallow: Able to elicit    Oral/Motor/Sensory Function Overall Oral Motor/Sensory Function: Within functional limits   Ice Chips Ice chips: Not tested   Thin Liquid Thin Liquid: Within functional limits Presentation: Self Fed;Straw    Nectar Thick Nectar Thick Liquid: Not tested   Honey Thick Honey Thick Liquid: Not tested   Puree Puree: Not tested   Solid     Solid: Not tested      Robert [name] Robert [name] 11/09/2018,9:20 AM  Robert [name], M.A. [place] [facility] Environmental education officer [phone] Office [phone]

## 2018-11-09 NOTE — Progress Notes (Signed)
OT Cancellation Note  Patient Details Name: Robert [name] MRN: [id number] DOB: May 21, 1956   Cancelled Treatment:    Reason Eval/Treat Not Completed: Fatigue/lethargy limiting ability to participate(Pt politely declining OT, wants to take a nap.)  Malka So 11/09/2018, 4:27 PM

## 2018-11-09 NOTE — Progress Notes (Signed)
Pt received from [place] via wheelchair. Telebox 11 applied/CCMD notified. Pt denies any complaints. Vitals stable. CHG bath given. Call bell within reach. Jerald Kief, RN

## 2018-11-09 NOTE — Progress Notes (Signed)
Admit: 11/04/2018 LOS: 4  43M s/p AAA rupture repaired 2/15 with AKI (Admit SCr 1.38) from ATN  Subjective:   . Robust decline in SCR, UOP > 5L . No complaints . Blood pressures improving, still not to goal . Tolerating liquids, remains on LR  02/18 0701 - 02/19 0700 In: 1700.4 [P.O.:500; I.V.:1190.4] Out: 5705 [Urine:5705]  Filed Weights   11/04/18 2311 11/08/18 0500 11/09/18 0500  Weight: 136.1 kg (!) 145.2 kg (!) 141.6 kg    Scheduled Meds: . amLODipine  10 mg Oral Daily  . aspirin  81 mg Oral Daily  . atorvastatin  80 mg Oral q1800  . carvedilol  6.25 mg Oral BID WC  . Chlorhexidine Gluconate Cloth  6 each Topical Daily  . mouth rinse  15 mL Mouth Rinse BID  . pantoprazole (PROTONIX) IV  40 mg Intravenous QHS  . sodium chloride flush  10-40 mL Intracatheter Q12H   Continuous Infusions: . sodium chloride    . lactated ringers 50 mL/hr at 11/09/18 0700   PRN Meds:.sodium chloride, acetaminophen **OR** acetaminophen, bisacodyl, fentaNYL, hydrALAZINE, metoprolol tartrate, morphine injection, ondansetron, phenol, sodium chloride flush  Current Labs: reviewed    Physical Exam:  Blood pressure (!) 162/100, pulse (!) 112, temperature 98.6 F (37 C), temperature source Oral, resp. rate (!) 24, height 6\' 4"  (1.93 m), weight (!) 141.6 kg, SpO2 94 %. Awake, alert, conversant, sitting in chair Regular, nl s1s2 Coarse Bs b/l No sig LEE Midline abdominal incision bandaged No rashes  A 1. Recovering nonoliguric AKI, admit SCr 1/38; 2/2 ATN from #2 + #3 + contrast exposrue 2. S/p AAA rupture and repair 2/15 3. Cardiac Arrest at time of presentation 4. Acute sCHF LVEF 25% 5. Hx/o HTN, on ACEi as outpt; uncontrolled; now on amlodipine and carvedilol, cardiology managing 6. VDRF, extubated 2/17; resolved 7. Hyperphosphatemia, resolved  P . Clearly recovering GFR . No further suggestions at the current time; does not require outpatient nephrology follow-up; cardiology and  primary care can manage blood pressure . continue IV fluids until urine output diminishes and he clearly tolerates p.o. intake . Will sign off at the current time, call with any questions  Pearson Grippe MD 11/09/2018, 8:39 AM  Recent Labs  Lab 11/07/18 0432 11/07/18 1441 11/08/18 0500 11/08/18 1556 11/09/18 0255  NA 145 144  --  148* 148*  K 4.5 4.3  --  3.9 4.1  CL 108 108  --  112* 114*  CO2 21* 22  --  25 28  GLUCOSE 138* 131*  --  114* 107*  BUN 63* 68*  --  57* 46*  CREATININE 5.60* 5.23*  --  2.57* 1.89*  CALCIUM 7.8* 8.1*  --  8.6* 8.7*  PHOS 7.2*  --  4.0  --  2.9   Recent Labs  Lab 11/07/18 0432 11/08/18 0500 11/09/18 0255  WBC 13.9* 12.7* 12.0*  NEUTROABS 11.9* 11.0* 9.9*  HGB 14.3 13.5 12.6*  HCT 42.5 41.4 40.3  MCV 88.0 89.0 93.1  PLT 75* 92* 103*

## 2018-11-09 NOTE — Progress Notes (Signed)
[facility]  Subjective  - feels ok   Objective (!) 162/100 (!) 112 98.6 F (37 C) (Oral) (!) 24 94%  Intake/Output Summary (Last 24 hours) at 11/09/2018 0856 Last data filed at 11/09/2018 0710 Gross per 24 hour  Intake 1600.45 ml  Output 5546 ml  Net -3945.55 ml   Abdomen soft incision healing  Assessment/Planning: POD #4 s/p rupture AAA Renal failure resolving Ileus resolving but pt prefers clears Hypertension being addressed by Cardiology Still awaiting 4E bed  Ruta Hinds 11/09/2018 8:56 AM --  Laboratory Lab Results: Recent Labs    11/08/18 0500 11/09/18 0255  WBC 12.7* 12.0*  HGB 13.5 12.6*  HCT 41.4 40.3  PLT 92* 103*   BMET Recent Labs    11/08/18 1556 11/09/18 0255  NA 148* 148*  K 3.9 4.1  CL 112* 114*  CO2 25 28  GLUCOSE 114* 107*  BUN 57* 46*  CREATININE 2.57* 1.89*  CALCIUM 8.6* 8.7*    COAG Lab Results  Component Value Date   INR 1.43 11/05/2018   INR 1.48 11/05/2018   INR 1.57 11/05/2018   No results found for: PTT

## 2018-11-09 NOTE — Progress Notes (Signed)
Progress Note  Patient Name: Robert [name] Date of Encounter: 11/09/2018  Primary Cardiologist: Dr. Aundra Dubin  Subjective   No chest pain  Inpatient Medications    Scheduled Meds: . amLODipine  10 mg Oral Daily  . aspirin  81 mg Oral Daily  . atorvastatin  80 mg Oral q1800  . carvedilol  6.25 mg Oral BID WC  . Chlorhexidine Gluconate Cloth  6 each Topical Daily  . mouth rinse  15 mL Mouth Rinse BID  . pantoprazole (PROTONIX) IV  40 mg Intravenous QHS  . sodium chloride flush  10-40 mL Intracatheter Q12H   Continuous Infusions: . sodium chloride    . lactated ringers 50 mL/hr at 11/09/18 0700   PRN Meds: sodium chloride, acetaminophen **OR** acetaminophen, bisacodyl, fentaNYL, hydrALAZINE, metoprolol tartrate, morphine injection, ondansetron, phenol, sodium chloride flush   Vital Signs    Vitals:   11/09/18 0530 11/09/18 0600 11/09/18 0700 11/09/18 0752  BP: (!) 147/87 (!) 156/90 (!) 162/100   Pulse: 100 88 (!) 112   Resp: (!) 25 (!) 22 (!) 24   Temp:    98.6 F (37 C)  TempSrc:    Oral  SpO2: 95% 96% 94%   Weight:      Height:        Intake/Output Summary (Last 24 hours) at 11/09/2018 0903 Last data filed at 11/09/2018 0710 Gross per 24 hour  Intake 1500.48 ml  Output 5546 ml  Net -4045.52 ml    I/O since admission: +4004  Filed Weights   11/04/18 2311 11/08/18 0500 11/09/18 0500  Weight: 136.1 kg (!) 145.2 kg (!) 141.6 kg    Telemetry    Sinus - Personally Reviewed  ECG   11/19/18 ECG (independently read by me): ST at 108, LVH  F/U 2/15 ECG (independently read by me): NSR with improvement in STE and residual T changes inferolaterally   2/15 ECG (independently read by me): NST 82; STE V2-3 inferior T changes  Physical Exam   BP (!) 162/100   Pulse (!) 112   Temp 98.6 F (37 C) (Oral)   Resp (!) 24   Ht 6\' 4"  (1.93 m)   Wt (!) 141.6 kg   SpO2 94%   BMI 38.00 kg/m  General: Alert, oriented, no distress.  Skin: normal turgor, no  rashes, warm and dry HEENT: Normocephalic, atraumatic. Pupils equal round and reactive to light; sclera anicteric; extraocular muscles intact; Nose without nasal septal hypertrophy Mouth/Parynx benign; Mallinpatti scale 3 Neck: No JVD, no carotid bruits; normal carotid upstroke Lungs: clear to ausculatation and percussion; no wheezing or rales Chest wall: without tenderness to palpitation Heart: PMI not displaced, RRR, s1 s2 normal, 1/6 systolic murmur, no diastolic murmur, no rubs, gallops, thrills, or heaves Abdomen: bandage removed; soft, nontender; no hepatosplenomehaly, BS+; abdominal aorta nontender and not dilated by palpation. Back: no CVA tenderness Pulses 2+ Musculoskeletal: full range of motion, normal strength, no joint deformities Extremities: no clubbing cyanosis or edema, Homan's sign negative  Neurologic: grossly nonfocal; Cranial nerves grossly wnl Psychologic: Normal mood and affect   Labs    Chemistry Recent Labs  Lab 11/06/18 0430  11/07/18 0432 11/07/18 1441 11/08/18 1556 11/09/18 0255  NA 145   < > 145 144 148* 148*  K 5.1   < > 4.5 4.3 3.9 4.1  CL 110  --  108 108 112* 114*  CO2 24  --  21* 22 25 28   GLUCOSE 144*  --  138* 131* 114*  107*  BUN 40*  --  63* 68* 57* 46*  CREATININE 4.59*  --  5.60* 5.23* 2.57* 1.89*  CALCIUM 7.6*  --  7.8* 8.1* 8.6* 8.7*  PROT 5.3*  --  5.6*  --   --  5.8*  ALBUMIN 2.8*  --  2.6*  --   --  2.7*  AST 145*  --  66*  --   --  50*  ALT 49*  --  19  --   --  21  ALKPHOS 44  --  48  --   --  54  BILITOT 0.7  --  1.0  --   --  2.0*  GFRNONAA 13*  --  10* 11* 26* 37*  GFRAA 15*  --  12* 13* 30* 43*  ANIONGAP 11  --  16* 14 11 6    < > = values in this interval not displayed.     Hematology Recent Labs  Lab 11/07/18 0432 11/08/18 0500 11/09/18 0255  WBC 13.9* 12.7* 12.0*  RBC 4.83 4.65 4.33  HGB 14.3 13.5 12.6*  HCT 42.5 41.4 40.3  MCV 88.0 89.0 93.1  MCH 29.6 29.0 29.1  MCHC 33.6 32.6 31.3  RDW 17.4* 16.9* 16.5*    PLT 75* 92* 103*    Cardiac Enzymes Recent Labs  Lab 11/05/18 0829 11/05/18 1446 11/05/18 2005 11/06/18 1108  TROPONINI 2.02* 5.71* 5.23* 1.00*   No results for input(s): TROPIPOC in the last 168 hours.   BNPNo results for input(s): BNP, PROBNP in the last 168 hours.   DDimer  Recent Labs  Lab 11/05/18 0312 11/05/18 0446  DDIMER >20.00* >20.00*     Lipid Panel  No results found for: CHOL, TRIG, HDL, CHOLHDL, VLDL, LDLCALC, LDLDIRECT   Radiology    Dg Abd 1 View  Result Date: 11/08/2018 CLINICAL DATA:  Encounter for feeding tube placement EXAM: ABDOMEN - 1 VIEW COMPARISON:  Three days ago FINDINGS: A gastric suction tube tip overlaps the stomach. A weighted feeding tube is not seen. Retrocardiac opacity and air bronchogram. IMPRESSION: Enteric tube tip over the stomach. Electronically Signed   By: Monte Fantasia M.D.   On: 11/08/2018 07:48    Cardiac Studies   11/05/18 ECHO IMPRESSIONS   1. The left ventricle has severely reduced systolic function, with an ejection fraction of 20-25%. The cavity size was normal. There is moderately increased left ventricular wall thickness. Left ventricular diastolic Doppler parameters are consistent  with impaired relaxation Left ventricular diffuse hypokinesis.  2. The right ventricle has moderately reduced systolic function. The cavity was mildly enlarged. There is mildly increased right ventricular wall thickness.  3. The mitral valve is normal in structure. No evidence of mitral valve stenosis. No significant regurgitation.  4. The tricuspid valve is normal in structure.  5. The aortic valve is tricuspid no stenosis of the aortic valve.  6. The pulmonic valve was normal in structure.  7. The aortic root and ascending aorta are normal in size and structure.  8. No complete TR doppler jet so unable to estimate PA systolic pressure.  Patient Profile     Robert [name] is a 63 y.o. male with a hx of AAA, HTN, HLP,  fibromuscular dysplasia s/p bilateral renal artery angioplasty in 2006 and underwent for AAA rupture  Assessment & Plan    1. S/p Ruptured AAA evening of 2/14 with emergent operative repair early  2/15 am  2. CPR with transient STE in Robert [name] prior  to transfer;  Resolution of STE on subsequent ECG. Repeat ECG without recurrence of STE; LVH with NDT changes  3. Acute systolic heart failure;  EF 20 - 25% on echo; trop peak 5.71 with trending downward.  Diffuse hypokinesis probably contributed by transient shock/cardiac arrest  4. AKI  Cr increased to peak 5.6 making urine. Continues with excellent diuresis  -2644  followed by - 4004;  Cr 5.60 > 5.23 > 2.57 > 1.89  5. HTN:  Continues to be elevated; yesterday 185/96;   Home meds reviewed: olmesartan 40 mg, clonidine.  Will not  re-initiate with AKI.  Started Amlodipine 10 mg daily and with LV dysfunction coreg initally at 6.25 mg bid with  PRN Hydralazine if needed.  BP today  158/97; HR 94.  Will further titrate carvedilol to 12.5 mg bid.   6. HLD: on atorvastatin 80 mg. Will check FLP in am   Will ultimately need R and Left heart cath after recovery of renal function, ? Timing;  Doubt acute MI and cardiomyopathy related to shock/hypoperfusion and cardiac arrest.      Signed, Troy Sine, MD, [facility] 11/09/2018, 9:03 AM

## 2018-11-10 LAB — LIPID PANEL
Cholesterol: 128 mg/dL (ref 0–200)
HDL: 41 mg/dL (ref >40)
LDL Cholesterol: 65 mg/dL (ref 0–99)
Total CHOL/HDL Ratio: 3.1 RATIO
Triglycerides: 109 mg/dL (ref <150)
VLDL: 22 mg/dL (ref 0–40)

## 2018-11-10 LAB — CBC WITH DIFFERENTIAL/PLATELET
Abs Immature Granulocytes: 0.08 10*3/uL — ABNORMAL HIGH (ref 0.00–0.07)
Basophils Absolute: 0 10*3/uL (ref 0.0–0.1)
Basophils Relative: 0 %
Eosinophils Absolute: 0.2 10*3/uL (ref 0.0–0.5)
Eosinophils Relative: 2 %
HCT: 39 % (ref 39.0–52.0)
Hemoglobin: 12.3 g/dL — ABNORMAL LOW (ref 13.0–17.0)
Immature Granulocytes: 1 %
Lymphocytes Relative: 10 %
Lymphs Abs: 1.1 10*3/uL (ref 0.7–4.0)
MCH: 29.3 pg (ref 26.0–34.0)
MCHC: 31.5 g/dL (ref 30.0–36.0)
MCV: 92.9 fL (ref 80.0–100.0)
MONO ABS: 1.1 10*3/uL — AB (ref 0.1–1.0)
Monocytes Relative: 9 %
Neutro Abs: 9.1 10*3/uL — ABNORMAL HIGH (ref 1.7–7.7)
Neutrophils Relative %: 78 %
PLATELETS: 92 10*3/uL — AB (ref 150–400)
RBC: 4.2 MIL/uL — ABNORMAL LOW (ref 4.22–5.81)
RDW: 15.8 % — ABNORMAL HIGH (ref 11.5–15.5)
WBC: 11.6 10*3/uL — ABNORMAL HIGH (ref 4.0–10.5)
nRBC: 0 % (ref 0.0–0.2)

## 2018-11-10 LAB — BASIC METABOLIC PANEL
Anion gap: 9 (ref 5–15)
BUN: 26 mg/dL — ABNORMAL HIGH (ref 8–23)
CO2: 23 mmol/L (ref 22–32)
Calcium: 8.5 mg/dL — ABNORMAL LOW (ref 8.9–10.3)
Chloride: 109 mmol/L (ref 98–111)
Creatinine, Ser: 1.16 mg/dL (ref 0.61–1.24)
GFR calc Af Amer: >60 mL/min (ref >60)
GFR calc non Af Amer: >60 mL/min (ref >60)
Glucose, Bld: 117 mg/dL — ABNORMAL HIGH (ref 70–99)
Potassium: 4 mmol/L (ref 3.5–5.1)
SODIUM: 141 mmol/L (ref 135–145)

## 2018-11-10 LAB — MAGNESIUM: Magnesium: 1.9 mg/dL (ref 1.7–2.4)

## 2018-11-10 LAB — PHOSPHORUS: Phosphorus: 2.5 mg/dL (ref 2.5–4.6)

## 2018-11-10 MED ORDER — CARVEDILOL 6.25 MG PO TABS
18.7500 mg | ORAL_TABLET | Freq: Two times a day (BID) | ORAL | Status: DC
  Administered 2018-11-11: 18.75 mg via ORAL
  Filled 2018-11-10: qty 1

## 2018-11-10 NOTE — Progress Notes (Addendum)
  AAA Progress Note    11/10/2018 7:57 AM 5 Days Post-Op  Subjective:  Says he is worn out from being up this morning  Afebrile HR 80's-110's  277'A-128'N systolic 86% RA  Vitals:   11/10/18 0215 11/10/18 0450  BP:  133/90  Pulse: 95 90  Resp: (!) 22 (!) 25  Temp:  98.7 F (37.1 C)  SpO2: 93% 93%    Physical Exam: Cardiac:  Regular  Lungs:  Non labored Abdomen:  Soft, NT/ND Incisions:  Clean and dry with staples in tact Extremities:  Easily palpable PT pulses bilaterally  CBC    Component Value Date/Time   WBC 11.6 (H) 11/10/2018 0448   RBC 4.20 (L) 11/10/2018 0448   HGB 12.3 (L) 11/10/2018 0448   HCT 39.0 11/10/2018 0448   PLT 92 (L) 11/10/2018 0448   MCV 92.9 11/10/2018 0448   MCH 29.3 11/10/2018 0448   MCHC 31.5 11/10/2018 0448   RDW 15.8 (H) 11/10/2018 0448   LYMPHSABS 1.1 11/10/2018 0448   MONOABS 1.1 (H) 11/10/2018 0448   EOSABS 0.2 11/10/2018 0448   BASOSABS 0.0 11/10/2018 0448    BMET    Component Value Date/Time   NA 148 (H) 11/09/2018 0255   K 4.1 11/09/2018 0255   CL 114 (H) 11/09/2018 0255   CO2 28 11/09/2018 0255   GLUCOSE 107 (H) 11/09/2018 0255   BUN 46 (H) 11/09/2018 0255   CREATININE 1.89 (H) 11/09/2018 0255   CALCIUM 8.7 (L) 11/09/2018 0255   GFRNONAA 37 (L) 11/09/2018 0255   GFRAA 43 (L) 11/09/2018 0255    INR    Component Value Date/Time   INR 1.43 11/05/2018 0829     Intake/Output Summary (Last 24 hours) at 11/10/2018 0757 Last data filed at 11/10/2018 0600 Gross per 24 hour  Intake 2578.76 ml  Output 2660 ml  Net -81.24 ml     Assessment/Plan:  63 y.o. male is s/p  Repair rupture AAA 5 Days Post-Op  -pt doing well this am -no nausea-advance diet -ABLA:  Stable and pt tolerating -thrombocytopenia:  Not receiving heparin.  SCD's for DVT prophylaxis  -continue to mobilize -anticipate discharge home in the next couple of days   Leontine Locket, [facility] [phone] 11/10/2018 7:57  AM   Agree with above.  Mobilize.  Full liquids today.  Advance to heart healthy tomorrow if tolerates  Ruta Hinds, MD [facility] [place] Office: [phone] Pager: [phone]

## 2018-11-10 NOTE — Progress Notes (Signed)
Physical Therapy Treatment Patient Details Name: Robert [name] MRN: [id number] DOB: [birth year] Today's Date: 11/10/2018    History of Present Illness Pt adm with ruptured AAA and underwent emergent repair on 2/15. Pt with AKI which is improving . PMH - cardiac stent, rt partial knee replacement.    PT Comments    Pt doing well with mobility. Encouraged pt to continue amb with his wife or other family throughout his stay.    Follow Up Recommendations  No PT follow up;Supervision - Intermittent     Equipment Recommendations  None recommended by PT    Recommendations for Other Services       Precautions / Restrictions Precautions Precautions: None Precaution Comments: avoid pulling / pushing with UE as much as possible use of heart pillow for coughing Restrictions Weight Bearing Restrictions: No    Mobility  Bed Mobility Overal bed mobility: Needs Assistance Bed Mobility: Sit to Supine       Sit to supine: Min assist   General bed mobility comments: Assist to bring feet back up in bed  Transfers Overall transfer level: Needs assistance Equipment used: Rolling walker (2 wheeled) Transfers: Sit to/from Stand Sit to Stand: Supervision         General transfer comment: supervision for lines/safety  Ambulation/Gait Ambulation/Gait assistance: Supervision Gait Distance (Feet): 300 Feet Assistive device: Rolling walker (2 wheeled);None Gait Pattern/deviations: Step-through pattern;Decreased stride length Gait velocity: normal Gait velocity interpretation: >4.37 ft/sec, indicative of normal walking speed General Gait Details: supervision for lines. Dyspnea 2/4 on RA. Amb last 10' in room without assistive device   Stairs             Wheelchair Mobility    Modified Rankin (Stroke Patients Only)       Balance Overall balance assessment: No apparent balance deficits (not formally assessed)                             High Level  Balance Comments: minguardA            Cognition Arousal/Alertness: Awake/alert Behavior During Therapy: WFL for tasks assessed/performed Overall Cognitive Status: Within Functional Limits for tasks assessed                                        Exercises      General Comments        Pertinent Vitals/Pain Pain Assessment: Faces Faces Pain Scale: Hurts a little bit Pain Location: abdomen Pain Descriptors / Indicators: Guarding Pain Intervention(s): Limited activity within patient's tolerance;Monitored during session    Home Living                      Prior Function            PT Goals (current goals can now be found in the care plan section) Acute Rehab PT Goals Patient Stated Goal: go home Progress towards PT goals: Progressing toward goals    Frequency    Min 3X/week      PT Plan Current plan remains appropriate    Co-evaluation              AM-PAC PT "6 Clicks" Mobility   Outcome Measure  Help needed turning from your back to your side while in a flat bed without using bedrails?: A Little Help needed moving from lying  on your back to sitting on the side of a flat bed without using bedrails?: A Little Help needed moving to and from a bed to a chair (including a wheelchair)?: A Little Help needed standing up from a chair using your arms (e.g., wheelchair or bedside chair)?: A Little Help needed to walk in hospital room?: A Little Help needed climbing 3-5 steps with a railing? : A Little 6 Click Score: 18    End of Session   Activity Tolerance: Patient tolerated treatment well Patient left: with call bell/phone within reach;with family/visitor present;in bed Nurse Communication: Mobility status PT Visit Diagnosis: Other abnormalities of gait and mobility (R26.89);Pain Pain - part of body: (abdomen)     Time: 1749-4496 PT Time Calculation (min) (ACUTE ONLY): 8 min  Charges:  $Gait Training: 8-22 mins                      [place] Pager [phone] Office [place] 11/10/2018, 2:37 PM

## 2018-11-10 NOTE — Progress Notes (Signed)
Occupational Therapy Treatment Patient Details Name: Robert [name] MRN: [id number] DOB: June 01, 1956 Today's Date: 11/10/2018    History of present illness Pt adm with ruptured AAA and underwent emergent repair on 2/15. Pt with AKI which is improving . PMH - cardiac stent, rt partial knee replacement.   OT comments  Pt agreeable to therapy. Pt positioned at EOB wanting to sit in recliner. MinguardA for power up and pt ambulating 3' to recliner with RW. Bed raised a significant amount as pt with tall bed at home. Pt performing grooming tasks in chair, but prefers spouse to perform them for him. Pt able to maneuver LLE for figure 4 technique, but unable to perform technique with RLE at this time. Pt performing LB ADL with modA overall and refused AE at this time. Pt advised to use 3in1 for shower chair. Energy conservation education performed. Pt and spouse agreeable to moving 1x an hour. Pt would benefit from continued OT skilled services for ADL, mobility and safety in [facility] setting. OT to follow acutely.    Follow Up Recommendations  Home health OT;Supervision - Intermittent    Equipment Recommendations  3 in 1 bedside commode;Toilet rise with handles(may need bariatric size)    Recommendations for Other Services      Precautions / Restrictions Precautions Precautions: None Precaution Comments: avoid pulling / pushing with UE as much as possible use of heart pillow for coughing Restrictions Weight Bearing Restrictions: No       Mobility Bed Mobility               General bed mobility comments: EOB to start session  Transfers Overall transfer level: Needs assistance     Sit to Stand: Min guard         General transfer comment: education on proper hand placement    Balance Overall balance assessment: No apparent balance deficits (not formally assessed)                             High Level Balance Comments: minguardA           ADL either  performed or assessed with clinical judgement   ADL       Grooming: Set up;Sitting                   Toilet Transfer: Min guard;BSC           Functional mobility during ADLs: Min guard;Rolling walker General ADL Comments: pt requires RW for balance at this time. Pt denied need for AE as he reports that his family will assist as needed.     Vision   Vision Assessment?: No apparent visual deficits   Perception     Praxis      Cognition Arousal/Alertness: Awake/alert Behavior During Therapy: WFL for tasks assessed/performed Overall Cognitive Status: Within Functional Limits for tasks assessed                                          Exercises     Shoulder Instructions       General Comments      Pertinent Vitals/ Pain       Pain Assessment: Faces Faces Pain Scale: Hurts a little bit Pain Location: abdomen Pain Descriptors / Indicators: Discomfort Pain Intervention(s): Limited activity within patient's tolerance  Home Living  Prior Functioning/Environment              Frequency  Min 2X/week        Progress Toward Goals  OT Goals(current goals can now be found in the care plan section)  Progress towards OT goals: Progressing toward goals  Acute Rehab OT Goals Patient Stated Goal: go home OT Goal Formulation: With patient/family Time For Goal Achievement: 11/29/18 ADL Goals Pt Will Perform Lower Body Dressing: with modified independence;with adaptive equipment;sit to/from stand Pt Will Transfer to Toilet: with modified independence;ambulating Additional ADL Goal #1: pt will verbalize and demonstrate 2 energy conservation strategies for adls  Plan Discharge plan remains appropriate    Co-evaluation                 AM-PAC OT "6 Clicks" Daily Activity     Outcome Measure   Help from another person eating meals?: None Help from another person taking care  of personal grooming?: A Little Help from another person toileting, which includes using toliet, bedpan, or urinal?: A Lot Help from another person bathing (including washing, rinsing, drying)?: A Lot Help from another person to put on and taking off regular upper body clothing?: A Little Help from another person to put on and taking off regular lower body clothing?: A Lot 6 Click Score: 16    End of Session Equipment Utilized During Treatment: Rolling walker  OT Visit Diagnosis: Unsteadiness on feet (R26.81);Muscle weakness (generalized) (M62.81)   Activity Tolerance Patient tolerated treatment well   Patient Left in chair;with call bell/phone within reach;with family/visitor present   Nurse Communication Mobility status        Time: 8811-0315 OT Time Calculation (min): 15 min  Charges: OT General Charges $OT Visit: 1 Visit OT Treatments $Self Care/Home Management : 8-22 mins  Darryl Nestle) Marsa Aris OTR/L Acute Rehabilitation Services Pager: [phone] Office: [phone]   Fredda Hammed 11/10/2018, 12:16 PM

## 2018-11-10 NOTE — Progress Notes (Signed)
Progress Note  Patient Name: Robert [name] Date of Encounter: 11/10/2018  Primary Cardiologist: Dr. Aundra Dubin  Subjective   No chest pain  Inpatient Medications    Scheduled Meds: . amLODipine  10 mg Oral Daily  . aspirin  81 mg Oral Daily  . atorvastatin  80 mg Oral q1800  . carvedilol  12.5 mg Oral BID WC  . Chlorhexidine Gluconate Cloth  6 each Topical Daily  . mouth rinse  15 mL Mouth Rinse BID  . pantoprazole (PROTONIX) IV  40 mg Intravenous QHS  . sodium chloride flush  10-40 mL Intracatheter Q12H   Continuous Infusions: . sodium chloride    . lactated ringers 50 mL/hr at 11/10/18 1638   PRN Meds: sodium chloride, acetaminophen **OR** acetaminophen, bisacodyl, fentaNYL, hydrALAZINE, metoprolol tartrate, morphine injection, ondansetron, phenol, sodium chloride flush   Vital Signs    Vitals:   11/10/18 0215 11/10/18 0450 11/10/18 0500 11/10/18 0956  BP:  133/90  (!) 158/109  Pulse: 95 90    Resp: (!) 22 (!) 25  (!) 26  Temp:  98.7 F (37.1 C)  99.1 F (37.3 C)  TempSrc:  Oral  Oral  SpO2: 93% 93%  96%  Weight:   (!) 142 kg   Height:        Intake/Output Summary (Last 24 hours) at 11/10/2018 1744 Last data filed at 11/10/2018 1638 Gross per 24 hour  Intake 2359.59 ml  Output 2275 ml  Net 84.59 ml    I/O since admission: +4539  Filed Weights   11/08/18 0500 11/09/18 0500 11/10/18 0500  Weight: (!) 145.2 kg (!) 141.6 kg (!) 142 kg    Telemetry    Sinus - Personally Reviewed  ECG   11/19/18 ECG (independently read by me): ST at 108, LVH  F/U 2/15 ECG (independently read by me): NSR with improvement in STE and residual T changes inferolaterally   2/15 ECG (independently read by me): NST 82; STE V2-3 inferior T changes  Physical Exam   BP (!) 158/109 (BP Location: Left Arm)   Pulse 90   Temp 99.1 F (37.3 C) (Oral)   Resp (!) 26   Ht 6\' 4"  (1.93 m)   Wt (!) 142 kg   SpO2 96%   BMI 38.10 kg/m  General: Alert, oriented, no distress.   Skin: normal turgor, no rashes, warm and dry HEENT: Normocephalic, atraumatic. Pupils equal round and reactive to light; sclera anicteric; extraocular muscles intact;  Nose without nasal septal hypertrophy Mouth/Parynx benign; Mallinpatti scale 3 Neck: No JVD, no carotid bruits; normal carotid upstroke Lungs: clear to ausculatation and percussion; no wheezing or rales Chest wall: without tenderness to palpitation Heart: PMI not displaced, RRR, s1 s2 normal, 1/6 systolic murmur, no diastolic murmur, no rubs, gallops, thrills, or heaves Abdomen: central adipositysoft, nontender; no hepatosplenomehaly, BS+; abdominal aorta nontender and not dilated by palpation. Back: no CVA tenderness Pulses 2+ Musculoskeletal: full range of motion, normal strength, no joint deformities Extremities: trace edema; no clubbing cyanosis or edema, Homan's sign negative  Neurologic: grossly nonfocal; Cranial nerves grossly wnl Psychologic: Normal mood and affect   Labs    Chemistry Recent Labs  Lab 11/06/18 0430  11/07/18 0432  11/08/18 1556 11/09/18 0255 11/10/18 0448  NA 145   < > 145   < > 148* 148* 141  K 5.1   < > 4.5   < > 3.9 4.1 4.0  CL 110  --  108   < > 112*  114* 109  CO2 24  --  21*   < > 25 28 23   GLUCOSE 144*  --  138*   < > 114* 107* 117*  BUN 40*  --  63*   < > 57* 46* 26*  CREATININE 4.59*  --  5.60*   < > 2.57* 1.89* 1.16  CALCIUM 7.6*  --  7.8*   < > 8.6* 8.7* 8.5*  PROT 5.3*  --  5.6*  --   --  5.8*  --   ALBUMIN 2.8*  --  2.6*  --   --  2.7*  --   AST 145*  --  66*  --   --  50*  --   ALT 49*  --  19  --   --  21  --   ALKPHOS 44  --  48  --   --  54  --   BILITOT 0.7  --  1.0  --   --  2.0*  --   GFRNONAA 13*  --  10*   < > 26* 37* >60  GFRAA 15*  --  12*   < > 30* 43* >60  ANIONGAP 11  --  16*   < > 11 6 9    < > = values in this interval not displayed.     Hematology Recent Labs  Lab 11/08/18 0500 11/09/18 0255 11/10/18 0448  WBC 12.7* 12.0* 11.6*  RBC 4.65 4.33  4.20*  HGB 13.5 12.6* 12.3*  HCT 41.4 40.3 39.0  MCV 89.0 93.1 92.9  MCH 29.0 29.1 29.3  MCHC 32.6 31.3 31.5  RDW 16.9* 16.5* 15.8*  PLT 92* 103* 92*    Cardiac Enzymes Recent Labs  Lab 11/05/18 0829 11/05/18 1446 11/05/18 2005 11/06/18 1108  TROPONINI 2.02* 5.71* 5.23* 1.00*   No results for input(s): TROPIPOC in the last 168 hours.   BNPNo results for input(s): BNP, PROBNP in the last 168 hours.   DDimer  Recent Labs  Lab 11/05/18 0312 11/05/18 0446  DDIMER >20.00* >20.00*     Lipid Panel     Component Value Date/Time   CHOL 128 11/10/2018 0448   TRIG 109 11/10/2018 0448   HDL 41 11/10/2018 0448   CHOLHDL 3.1 11/10/2018 0448   VLDL 22 11/10/2018 0448   LDLCALC 65 11/10/2018 0448     Radiology    Dg Chest Port 1 View  Result Date: 11/09/2018 CLINICAL DATA:  Respiratory failure EXAM: PORTABLE CHEST 1 VIEW COMPARISON:  Two days ago FINDINGS: Interval tracheal and esophageal extubation. Stable subclavian line positioning. Improved right perihilar aeration. There is still low volume chest and dense retrocardiac opacity. IMPRESSION: 1. Improved right perihilar aeration. 2. Unchanged retrocardiac opacity favoring atelectasis in the absence of infectious symptoms. Electronically Signed   By: Monte Fantasia M.D.   On: 11/09/2018 09:19    Cardiac Studies   11/05/18 ECHO IMPRESSIONS   1. The left ventricle has severely reduced systolic function, with an ejection fraction of 20-25%. The cavity size was normal. There is moderately increased left ventricular wall thickness. Left ventricular diastolic Doppler parameters are consistent  with impaired relaxation Left ventricular diffuse hypokinesis.  2. The right ventricle has moderately reduced systolic function. The cavity was mildly enlarged. There is mildly increased right ventricular wall thickness.  3. The mitral valve is normal in structure. No evidence of mitral valve stenosis. No significant regurgitation.  4. The  tricuspid valve is normal in structure.  5. The aortic valve  is tricuspid no stenosis of the aortic valve.  6. The pulmonic valve was normal in structure.  7. The aortic root and ascending aorta are normal in size and structure.  8. No complete TR doppler jet so unable to estimate PA systolic pressure.  Patient Profile     Robert [name] is a 63 y.o. male with a hx of AAA, HTN, HLP, fibromuscular dysplasia s/p bilateral renal artery angioplasty in 2006 and underwent for AAA rupture  Assessment & Plan    1. S/p Ruptured AAA evening of 2/14 with emergent operative repair early  2/15 am  2. CPR with transient STE in [place] prior to transfer;  Resolution of STE on subsequent ECG. Repeat ECG without recurrence of STE; LVH with NDT changes  3. Acute systolic heart failure;  EF 20 - 25% on echo; trop peak 5.71 with trending downward.  Diffuse hypokinesis probably contributed by transient shock/cardiac arrest.    No overt signs of CHF on exam.  Will recheck BMP level in a.m.  I will also recommend a follow-up echo Doppler study to see if there is any improvement in LV function with stabilization following his emergent surgery and resolution of prior shock.  4. AKI  Cr increased to peak 5.6 making urine. Continues with excellent diuresis  -2644  followed by - 4004;  Cr 5.60 > 5.23 > 2.57 > 1.89 > 1.16 today.  5. HTN:  Continues to be elevated; yesterday 185/96;   Home meds reviewed: olmesartan 40 mg, clonidine.  Will not  re-initiate with AKI.  Started Amlodipine 10 mg daily and with LV dysfunction coreg initally at 6.25 mg bid with  PRN Hydralazine if needed.  BP 2/19:  158/97; HR 94 carvedilol titrated to 12.5 mg bid.  We will now plan to further increase to 18.75 mg twice a day with ultimate plans to titrate to 25 mg twice a day as tolerated.  In the future will most likely be able to reinstitute low-dose ARB therapy but will allow more time following resolution of his initial acute  kidney injury.  6. HLD: on atorvastatin 80 mg. Will check FLP in am  May ultimately need R and Left heart cath after recovery of renal function, ? Timing;  Doubt acute MI and cardiomyopathy related to shock/hypoperfusion and cardiac arrest.      Signed, Troy Sine, MD, [facility] 11/10/2018, 5:44 PM

## 2018-11-11 ENCOUNTER — Inpatient Hospital Stay (HOSPITAL_COMMUNITY): Payer: 59

## 2018-11-11 DIAGNOSIS — I429 Cardiomyopathy, unspecified: Secondary | ICD-10-CM

## 2018-11-11 DIAGNOSIS — R000 Tachycardia, unspecified: Secondary | ICD-10-CM

## 2018-11-11 LAB — CBC WITH DIFFERENTIAL/PLATELET
Abs Immature Granulocytes: 0.24 10*3/uL — ABNORMAL HIGH (ref 0.00–0.07)
BASOS ABS: 0 10*3/uL (ref 0.0–0.1)
Basophils Relative: 0 %
Eosinophils Absolute: 0.1 10*3/uL (ref 0.0–0.5)
Eosinophils Relative: 1 %
HCT: 40.3 % (ref 39.0–52.0)
Hemoglobin: 12.9 g/dL — ABNORMAL LOW (ref 13.0–17.0)
Immature Granulocytes: 2 %
Lymphocytes Relative: 7 %
Lymphs Abs: 1.1 10*3/uL (ref 0.7–4.0)
MCH: 29.3 pg (ref 26.0–34.0)
MCHC: 32 g/dL (ref 30.0–36.0)
MCV: 91.4 fL (ref 80.0–100.0)
Monocytes Absolute: 1.2 10*3/uL — ABNORMAL HIGH (ref 0.1–1.0)
Monocytes Relative: 8 %
NRBC: 0 % (ref 0.0–0.2)
Neutro Abs: 12.2 10*3/uL — ABNORMAL HIGH (ref 1.7–7.7)
Neutrophils Relative %: 82 %
Platelets: 124 10*3/uL — ABNORMAL LOW (ref 150–400)
RBC: 4.41 MIL/uL (ref 4.22–5.81)
RDW: 15.2 % (ref 11.5–15.5)
WBC: 14.8 10*3/uL — AB (ref 4.0–10.5)

## 2018-11-11 LAB — BASIC METABOLIC PANEL
ANION GAP: 8 (ref 5–15)
BUN: 19 mg/dL (ref 8–23)
CO2: 24 mmol/L (ref 22–32)
Calcium: 8.8 mg/dL — ABNORMAL LOW (ref 8.9–10.3)
Chloride: 106 mmol/L (ref 98–111)
Creatinine, Ser: 1.01 mg/dL (ref 0.61–1.24)
GFR calc Af Amer: >60 mL/min (ref >60)
GFR calc non Af Amer: >60 mL/min (ref >60)
GLUCOSE: 117 mg/dL — AB (ref 70–99)
Potassium: 3.9 mmol/L (ref 3.5–5.1)
Sodium: 138 mmol/L (ref 135–145)

## 2018-11-11 LAB — PHOSPHORUS: Phosphorus: 2.3 mg/dL — ABNORMAL LOW (ref 2.5–4.6)

## 2018-11-11 LAB — MAGNESIUM: MAGNESIUM: 1.6 mg/dL — AB (ref 1.7–2.4)

## 2018-11-11 LAB — ECHOCARDIOGRAM LIMITED
Height: 76 in
WEIGHTICAEL: 4984.16 [oz_av]

## 2018-11-11 LAB — BRAIN NATRIURETIC PEPTIDE: B Natriuretic Peptide: 43.1 pg/mL (ref 0.0–100.0)

## 2018-11-11 MED ORDER — CARVEDILOL 25 MG PO TABS
25.0000 mg | ORAL_TABLET | Freq: Two times a day (BID) | ORAL | Status: DC
  Administered 2018-11-11: 25 mg via ORAL
  Filled 2018-11-11: qty 1

## 2018-11-11 MED ORDER — CARVEDILOL 25 MG PO TABS: 25.0000 mg | ORAL_TABLET | Freq: Two times a day (BID) | ORAL | 1 refills | Status: AC

## 2018-11-11 MED ORDER — PERFLUTREN LIPID MICROSPHERE
1.0000 mL | INTRAVENOUS | Status: AC | PRN
  Administered 2018-11-11: 3 mL via INTRAVENOUS
  Filled 2018-11-11: qty 10

## 2018-11-11 MED ORDER — OXYCODONE-ACETAMINOPHEN 5-325 MG PO TABS: 1.0000 | ORAL_TABLET | Freq: Four times a day (QID) | ORAL | 0 refills | Status: DC | PRN

## 2018-11-11 MED ORDER — AMLODIPINE BESYLATE 10 MG PO TABS: 10.0000 mg | ORAL_TABLET | Freq: Every day | ORAL | 1 refills | Status: AC

## 2018-11-11 MED ORDER — PANTOPRAZOLE SODIUM 40 MG PO TBEC: 40.0000 mg | DELAYED_RELEASE_TABLET | Freq: Every day | ORAL | Status: DC

## 2018-11-11 MED FILL — CARVEDILOL 25 MG TABLET: 25 | 30 days supply | Qty: 60 | Fill #0 | Status: TO

## 2018-11-11 MED FILL — OXYCODONE W/APAP 5/325 TAB: 5-325 | 7 days supply | Qty: 30 | Fill #0

## 2018-11-11 MED FILL — AMLODIPINE BESYLATE 10 MG T: 10 | 30 days supply | Qty: 30 | Fill #0 | Status: TO

## 2018-11-11 NOTE — Discharge Instructions (Signed)
 [facility]  Discharge Instructions   Open Aortic Surgery  Please refer to the following instructions for your post-procedure care. Your surgeon or Physician Assistant will discuss any changes with you.  Activity  Avoid lifting more than eight pounds (a gallon of milk) until after your first post-operative visit. You are encouraged to walk as much as you can. You can slowly return to normal activities but must avoid strenuous activity and heavy lifting until your doctor tells you it's okay. Heavy lifting can hurt the incision and cause a hernia. Avoid activities such as vacuuming or swinging a golf club. It is normal to feel tired for several weeks after your surgery. Do not drive until your doctor gives the okay and you are no longer taking prescription pain medications. It is also normal to have difficulty with sleep habits, eating and bowl movements after surgery. These will go away with time.  Bathing/Showering  Shower daily after you go home. Do not soak in a bathtub, hot tub, or swim until the incision heals.  Incision Care  Shower every day. Clean your incision with mild soap and water. Pat the area dry with a clean towel. You do not need a bandage unless otherwise instructed. Do not apply any ointments or creams to your incision. You may have skin glue on your incision. Do not peel it off. It will come off on its own in about one week. If you have staples or sutures along your incision, they will be removed at your post op appointment.  If you have groin incisions, wash the groin wounds with soap and water daily and pat dry. (No tub bath-only shower)  Then put a dry gauze or washcloth in the groin to keep this area dry to help prevent wound infection.  Do this daily and as needed.  Do not use Vaseline or neosporin on your incisions.  Only use soap and water on your incisions and then protect and keep dry.  Diet  Resume your normal diet. There are no  special food restriction following this procedure. A low fat/low cholesterol diet is recommended for all patients with vascular disease. After your aortic surgery, it's normal to feel full faster than usual and to not feel as hungry as you normally would. You will probably lose weight initially following your surgery. It's best to eat small, frequent meals over the course of the day. Call the office if you find that you are unable to eat even small meals.   In order to heal from your surgery, it is CRITICAL to get adequate nutrition. Your body requires vitamins, minerals, and protein. Vegetables are the best source of vitamins and minerals. If you have pain, you may take over-the-counter pain reliever such as acetaminophen (Tylenol). If you were prescribed a stronger pain medication, please be aware these medication can cause nausea and constipation. Prevent nausea by taking the medication with a snack or meal. Avoid constipation by drinking plenty of fluids and eating foods with a high amount of fiber, such as fruits, vegetables and grains. Take 100mg of the over-the-counter stool softener Colace twice a day as needed to help with constipation. A laxative, such as Milk of Magnesia, may be recommended for you at this time. Do not take a laxative unless your surgeon or P.A. tells you it's OK.  Do not take Tylenol if you are taking stronger pain medications (such as Percocet).  Follow Up  Our office will schedule a follow up   appointment 2-3 weeks after discharge.  Please call us immediately for any of the following conditions    .     Severe or worsening pain in your legs or feet or in your abdomen back or chest. Increased pain, redness drainage (pus) from your incision site. Increased abdominal pain, bloating, nausea, vomiting, or persistent diarrhea. Fever of 101 degrees or higher. Swelling in your leg (s).  Reduce your risk of vascular disease  Stop smoking. If you would like help, call  [facility] at 1-800-QUIT-NOW (1-800-784-8669) or [place] at 336-586-4000. Manage your cholesterol Maintain a desired weight Control your diabetes Keep your blood pressure down  If you have any questions please call the office at [phone].   

## 2018-11-11 NOTE — Progress Notes (Signed)
  Echocardiogram 2D Echocardiogram limited with definity has been performed.  Darlina Sicilian M 11/11/2018, 9:58 AM

## 2018-11-11 NOTE — Progress Notes (Addendum)
  Progress Note    11/11/2018 7:50 AM 6 Days Post-Op  Subjective:  Says he has walked this morning and bathed.  He tolerated clear liquids yesterday  Tm 99 HR 100's-110's ST 703'J-009'F systolic 81% RA  Vitals:   11/11/18 0430 11/11/18 0515  BP: (!) 140/107 (!) 141/94  Pulse: (!) 108 (!) 101  Resp: (!) 25 (!) 25  Temp: 99 F (37.2 C)   SpO2: 93% 92%    Physical Exam: Cardiac:  regular Lungs:  Non labored Incisions:  Clean and dry with staples in tact Extremities:  Easily palpable PT pulses bilaterally Abdomen:  Soft, NT; +BM  CBC    Component Value Date/Time   WBC 14.8 (H) 11/11/2018 0429   RBC 4.41 11/11/2018 0429   HGB 12.9 (L) 11/11/2018 0429   HCT 40.3 11/11/2018 0429   PLT 124 (L) 11/11/2018 0429   MCV 91.4 11/11/2018 0429   MCH 29.3 11/11/2018 0429   MCHC 32.0 11/11/2018 0429   RDW 15.2 11/11/2018 0429   LYMPHSABS 1.1 11/11/2018 0429   MONOABS 1.2 (H) 11/11/2018 0429   EOSABS 0.1 11/11/2018 0429   BASOSABS 0.0 11/11/2018 0429    BMET    Component Value Date/Time   NA 138 11/11/2018 0429   K 3.9 11/11/2018 0429   CL 106 11/11/2018 0429   CO2 24 11/11/2018 0429   GLUCOSE 117 (H) 11/11/2018 0429   BUN 19 11/11/2018 0429   CREATININE 1.01 11/11/2018 0429   CALCIUM 8.8 (L) 11/11/2018 0429   GFRNONAA >60 11/11/2018 0429   GFRAA >60 11/11/2018 0429    INR    Component Value Date/Time   INR 1.43 11/05/2018 0829     Intake/Output Summary (Last 24 hours) at 11/11/2018 0750 Last data filed at 11/11/2018 0600 Gross per 24 hour  Intake 1783.87 ml  Output 2175 ml  Net -391.13 ml     Assessment:  63 y.o. male is s/p:  Repair rupture AAA  6 Days Post-Op  Plan: -pt tolerated clear liquids-will advance to heart healthy.  Dc IVF.  +BM -pt is ambulating well-continue -mildly tachycardic-may need to increase BB but will defer to cardiology -anticipate discharge tomorrow if tolerates diet.    Leontine Locket, PA-C [facility] [phone] 11/11/2018 7:50 AM  Heart healthy diet today.  He wants to go home today. Will await Cardiology recs but probably can have BP fine tuned as outpt ECHO etc  Ruta Hinds, MD [facility] [place] Office: [phone] Pager: [phone]

## 2018-11-11 NOTE — Progress Notes (Signed)
  Speech Language Pathology Treatment: Dysphagia  Patient Details Name: Robert [name] MRN: [id number] DOB: 03/19/1956 Today's Date: 11/11/2018 Time: 3754-2370 SLP Time Calculation (min) (ACUTE ONLY): 8 min  Assessment / Plan / Recommendation Clinical Impression  Pt was seen briefly, as he did not want to consume much PO intake. He had recently had some of his lunch, wasn't very hungry, and said he was tired. He did agree to sips of thin liquids and small amounts of solids, which he consumed without overt difficulty. He and his wife believe that his vocal quality is at baseline and they deny any trouble swallowing. Recommend continuing regular solids and thin liquids. SLP to sign off at this time.   HPI HPI: Pt is a 63 yo male admitted with ruptured AAA and underwent emergent repair on 2/15. ETT 2/14-2/17. Pt with AKI which is improving. Hospital course also complicated by ileus. PMH - cardiac stent, rt partial knee replacement.      SLP Plan  All goals met       Recommendations  Diet recommendations: Regular;Thin liquid Liquids provided via: Cup;Straw Medication Administration: Whole meds with liquid Supervision: Patient able to self feed;Intermittent supervision to cue for compensatory strategies Compensations: Slow rate;Small sips/bites Postural Changes and/or Swallow Maneuvers: Seated upright 90 degrees                Oral Care Recommendations: Oral care BID Follow up Recommendations: None SLP Visit Diagnosis: Dysphagia, unspecified (R13.10) Plan: All goals met       GO                Robert [name] 11/11/2018, 1:34 PM  Nuala Alpha, M.A. [employer] Acute Environmental education officer [phone] Office [phone]

## 2018-11-11 NOTE — Discharge Summary (Signed)
AAA Discharge Summary    Robert [name] [birth year] 63 y.o. male  [id number]  Admission Date: 11/04/2018  Discharge Date: 11/11/2018  Physician: No att. providers found  Admission Diagnosis: Ruptured abdominal aortic aneurysm (AAA) ([place]) [I71.3]   HPI:   This is a 63 y.o. male seen earlier this evening at [facility] med center.  At while he was there he was diagnosed with a ruptured abdominal aortic aneurysm.  Currently had a brief course of CPR at [facility].  He was then transferred by ambulance directly to the operating room 16 at [facility].  The patient was awake on arrival to the hospital.  He was complaining of abdominal pain.  Other medical problems include hyperlipidemia and hypertension.  She had a known abdominal aortic aneurysm and was scheduled for follow-up in December of this past year but did not show.  Hospital Course:  The patient was admitted to the hospital and taken to the operating room on 11/05/2018 and underwent: Repair of ruptured AAA    Findings: anterior rupture infrarenal abdominal aortic aneurysm  The pt tolerated the procedure well and was transported to the ICU in serious condition.  CCM and cardiology were consulted.  He did have ARF with creatinine as high as 5.6 and recovered to 1.0 at discharge.    Also in the ICU, pt was having EKG changed.  Echo done at bedside revealed normal LV size, moderate LVH, global hypokinesis with EF in the 20-25% range. He was not having chest pain. His coags were corrected with FFP and platelets. Later that day, he did not appear to be bleeding.  He was given 2L  Saline to fluid challenge kidneys will chase with lasix later today if remains anuric.    POD1, Acute renal failure probably ATN hopefully recover over next few days.  Critical service contacting nephrology today. RA pressure on ECHO 0 but CVP yesterday was 17.  Difficult to know if he is hyper or euvolemic at this point but I would suspect he is still  third spacing and needs fluid.  Will defer to renal opinion.  Elevated cardiac enzymes spoke with DR Marigene Ehlers probably CPR and demand related BP heart rate reasonable EF 25% on ECHO  VDRF minimal vent settings hopefully extubate soon per CCM  He had a renal artery duplex: Renal:   Right: No evidence of right renal artery stenosis. Normal right        Resisitive Index. RRV flow present. Left:  No evidence of left renal artery stenosis. Normal left        Resistive Index. LRV flow present. Mesenteric:   SMA and Celiac artery can not be able to evaluate due to bowel gas and patient body habitus  By POD 2, Renal failure non oliguric creatinine seems to have peaked gently hydrate for NG losses.  NGT left in until BM.  Hypertensive post op managed by cardiology.  ARB and clonidine not restarted due to ARF.  He was started on Norvasc 10mg  daily and coreg 6.25mg  bid.     POD 3, he was transferred to [place].  His renal failure is recovering. '  POD 4, ARF continues to resolve.  Ileus resolving.  Continue clears.    POD 5, ABLA stable.  Thrombocytopenia (improved by discharge).   SCD's for DVT prophylaxis.    POD 6, diet advanced.  Cardiology continues to manage BP meds.  Most likely home today.  Per Cardiology: HTN: Continues to be elevated; has been 185/96;  now 141/94 to 155/115 --Home meds reviewed: olmesartan 40 mg, clonidine. Will not re-initiate with AKI. Started Amlodipine 10 mg daily and with LV dysfunction coreg initally at 6.25 mg bid with PRN Hydralazine if needed. BP 2/19:158/97; HR 94 carvedilol titratedto 12.5 mg bid.Increased yesterday to 18.75 mg twice a day with ultimate plans to titrate to 25 mg twice a day as tolerated. In the future will most likely be able to reinstitute low-dose ARB therapy but will allow more time following resolution of his initial acute kidney injury.  The remainder of the hospital course consisted of increasing mobilization and increasing  intake of solids without difficulty.  CBC    Component Value Date/Time   WBC 14.0 (H) 11/14/2018 2145   RBC 4.55 11/14/2018 2145   HGB 13.2 11/14/2018 2145   HCT 41.0 11/14/2018 2145   PLT 239 11/14/2018 2145   MCV 90.1 11/14/2018 2145   MCH 29.0 11/14/2018 2145   MCHC 32.2 11/14/2018 2145   RDW 15.0 11/14/2018 2145   LYMPHSABS 1.1 11/14/2018 2145   MONOABS 0.8 11/14/2018 2145   EOSABS 0.1 11/14/2018 2145   BASOSABS 0.0 11/14/2018 2145    BMET    Component Value Date/Time   NA 135 11/14/2018 2145   K 3.6 11/14/2018 2145   CL 103 11/14/2018 2145   CO2 22 11/14/2018 2145   GLUCOSE 129 (H) 11/14/2018 2145   BUN 15 11/14/2018 2145   CREATININE 0.90 11/14/2018 2150   CALCIUM 8.4 (L) 11/14/2018 2145   GFRNONAA >60 11/14/2018 2145   GFRAA >60 11/14/2018 2145     Discharge Instructions    Call MD for:  redness, tenderness, or signs of infection (pain, swelling, bleeding, redness, odor or green/yellow discharge around incision site)   Complete by:  As directed    Call MD for:  severe or increased pain, loss or decreased feeling  in affected limb(s)   Complete by:  As directed    Call MD for:  temperature >100.5   Complete by:  As directed    Resume previous diet   Complete by:  As directed       Discharge Diagnosis:  Ruptured abdominal aortic aneurysm (AAA) ([place]) [I71.3]  Secondary Diagnosis: Patient Active Problem List   Diagnosis Date Noted  . Hematoma 11/15/2018  . Acute renal failure ([place])   . Cardiomyopathy ([place])   . Cardiac arrest ([place])   . Ruptured abdominal aortic aneurysm (AAA) ([place]) 11/05/2018  . AAA (abdominal aortic aneurysm, ruptured) ([place]) 11/05/2018  . Umbilical hernia 45/80/9983   Past Medical History:  Diagnosis Date  . AAA (abdominal aortic aneurysm, ruptured) ([place])   . Fibromuscular dysplasia of renal artery (H[facility])    s/p angioplasty to both renal arteries in 2006  . History of cardiac catheterization    LHC in 2013:  no sig CAD  .  Hyperlipidemia   . Hypertension      Allergies as of 11/11/2018   No Known Allergies     Medication List    STOP taking these medications   cloNIDine 0.1 MG tablet Commonly known as:  CATAPRES   olmesartan 40 MG tablet Commonly known as:  BENICAR     TAKE these medications   amLODipine 10 MG tablet Commonly known as:  NORVASC Take 1 tablet (10 mg total) by mouth daily.   aspirin EC 81 MG tablet Take 81 mg by mouth daily.   carvedilol 25 MG tablet Commonly known as:  COREG Take 1 tablet (25 mg total)  by mouth 2 (two) times daily with a meal.   oxyCODONE-acetaminophen 5-325 MG tablet Commonly known as:  PERCOCET Take 1 tablet by mouth every 6 (six) hours as needed for severe pain.   rosuvastatin 20 MG tablet Commonly known as:  CRESTOR Take 20 mg by mouth daily.       Instructions:  [facility] Discharge Instructions   Open Aortic Surgery  Please refer to the following instructions for your post-procedure care. Your surgeon or Physician Assistant will discuss any changes with you.  Activity  Avoid lifting more than eight pounds (a gallon of milk) until after your first post-operative visit. You are encouraged to walk as much as you can. You can slowly return to normal activities but must avoid strenuous activity and heavy lifting until your doctor tells you it's okay. Heavy lifting can hurt the incision and cause a hernia. Avoid activities such as vacuuming or swinging a golf club. It is normal to feel tired for several weeks after your surgery. Do not drive until your doctor gives the okay and you are no longer taking prescription pain medications. It is also normal to have difficulty with sleep habits, eating and bowl movements after surgery. These will go away with time.  Bathing/Showering  Shower daily after you go home. Do not soak in a bathtub, hot tub, or swim until the incision heals.  Incision Care  Shower every day.  Clean your incision with mild soap and water. Pat the area dry with a clean towel. You do not need a bandage unless otherwise instructed. Do not apply any ointments or creams to your incision. You may have skin glue on your incision. Do not peel it off. It will come off on its own in about one week. If you have staples or sutures along your incision, they will be removed at your post op appointment.  If you have groin incisions, wash the groin wounds with soap and water daily and pat dry. (No tub bath-only shower)  Then put a dry gauze or washcloth in the groin to keep this area dry to help prevent wound infection.  Do this daily and as needed.  Do not use Vaseline or neosporin on your incisions.  Only use soap and water on your incisions and then protect and keep dry.  Diet  Resume your normal diet. There are no special food restriction following this procedure. A low fat/low cholesterol diet is recommended for all patients with vascular disease. After your aortic surgery, it's normal to feel full faster than usual and to not feel as hungry as you normally would. You will probably lose weight initially following your surgery. It's best to eat small, frequent meals over the course of the day. Call the office if you find that you are unable to eat even small meals.   In order to heal from your surgery, it is CRITICAL to get adequate nutrition. Your body requires vitamins, minerals, and protein. Vegetables are the best source of vitamins and minerals.  If you have pain, you may take over-the-counter pain reliever such as acetaminophen (Tylenol). If you were prescribed a stronger pain medication, please be aware these medication can cause nausea and constipation. Prevent nausea by taking the medication with a snack or meal. Avoid constipation by drinking plenty of fluids and eating foods with a high amount of fiber, such as fruits, vegetables and grains. Take 100mg  of the over-the-counter stool softener  Colace twice a day as needed  to help with constipation. A laxative, such as Milk of Magnesia, may be recommended for you at this time. Do not take a laxative unless your surgeon or P.A. tells you it's OK.  Do not take Tylenol if you are taking stronger pain medications (such as Percocet).  Follow Up  Our office will schedule a follow up appointment 2-3 weeks after discharge.  Please call us immediately for any of the following conditions    .     Severe or worsening pain in your legs or feet or in your abdomen back or chest. . Increased pain, redness drainage (pus) from your incision site. . Increased abdominal pain, bloating, nausea, vomiting, or persistent diarrhea. . Fever of 101 degrees or higher. . Swelling in your leg (s). .  Reduce your risk of vascular disease  . Stop smoking. If you would like help, call [facility] at 1-800-QUIT-NOW 680 691 0549) or [place] at (682)840-0164. . Manage your cholesterol . Maintain a desired weight . Control your diabetes . Keep your blood pressure down .  If you have any questions please call the office at [phone].   Prescriptions given: 1.  Roxicet #30 No Refill 2.  Coreg 25mg  bid #60 one RF (refills per cards or PCP) 3.  Norvasc 10mg  daily #30 one RF (refills per cards or PCP)  Disposition: home  Patient's condition: is Good  Follow up: 1. Dr. Oneida Alar in 2 weeks   Leontine Locket, PA-C [facility] [phone] 11/15/2018  12:38 PM   - For VQI Registry use -  Post-op:  Time to Extubation: []  In OR, [ ]  < 12 hrs, [ ]  12-24 hrs, [ x] >=24 hrs Vasopressors Req. Post-op: No ICU Stay: 3 day in ICU Transfusion: Yes   If yes, 16 PRBC's units given; 14 FFP given; 5 plt packs given MI: ? , [ ]  Troponin only, [ ]  EKG or Clinical New Arrhythmia: No  Complications: CHF: No Resp failure: No, [ ]  Pneumonia, [ ]  Ventilator Chg in renal function: Yes, [ x] Inc. Cr > 0.5, [ ]  Temp. Dialysis, [ ]  Permanent  dialysis Leg ischemia: No, no Surgery needed, [ ]  Yes, Surgery needed, [ ]  Amputation Bowel ischemia: No, [ ]  Medical Rx, [ ]  Surgical Rx Wound complication: No, [ ]  Superficial separation/infection, [ ]  Return to OR Return to OR: No  Return to OR for bleeding: No Stroke: No, [ ]  Minor, [ ]  Major  Discharge medications: Statin use:  Yes  ASA use:  Yes   Plavix use:  No  Beta blocker use:  Yes  ACEI use:  No ARB use:  No CCB use:  Yes Coumadin use:  No

## 2018-11-11 NOTE — Progress Notes (Addendum)
Progress Note  Patient Name: Robert [name] Date of Encounter: 11/11/2018  Primary Cardiologist: Loralie Champagne, MD for CHF  Subjective   No pain or SOB, plan for discharge this PM  Pt has been walking in the hall without problems  Inpatient Medications    Scheduled Meds: . amLODipine  10 mg Oral Daily  . aspirin  81 mg Oral Daily  . atorvastatin  80 mg Oral q1800  . carvedilol  18.75 mg Oral BID WC  . Chlorhexidine Gluconate Cloth  6 each Topical Daily  . mouth rinse  15 mL Mouth Rinse BID  . pantoprazole (PROTONIX) IV  40 mg Intravenous QHS  . sodium chloride flush  10-40 mL Intracatheter Q12H   Continuous Infusions: . sodium chloride    . lactated ringers 50 mL/hr at 11/11/18 0737   PRN Meds: sodium chloride, acetaminophen **OR** acetaminophen, bisacodyl, fentaNYL, hydrALAZINE, metoprolol tartrate, morphine injection, ondansetron, phenol, sodium chloride flush   Vital Signs    Vitals:   11/11/18 0000 11/11/18 0430 11/11/18 0500 11/11/18 0515  BP:  (!) 140/107  (!) 141/94  Pulse: (!) 103 (!) 108  (!) 101  Resp: (!) 25 (!) 25  (!) 25  Temp:  99 F (37.2 C)    TempSrc:  Oral    SpO2: 93% 93%  92%  Weight:   (!) 141.3 kg   Height:        Intake/Output Summary (Last 24 hours) at 11/11/2018 0903 Last data filed at 11/11/2018 0600 Gross per 24 hour  Intake 1783.87 ml  Output 2175 ml  Net -391.13 ml   Last 3 Weights 11/11/2018 11/10/2018 11/09/2018  Weight (lbs) 311 lb 8.2 oz 313 lb 312 lb 2.7 oz  Weight (kg) 141.3 kg 141.976 kg 141.6 kg      Telemetry    SR to ST - Personally Reviewed  ECG    SR nonspecific T wave abnormalities.  - Personally Reviewed  Physical Exam   GEN: No acute distress.   Neck: No JVD Cardiac: RRR, no murmurs, rubs, or gallops.  Respiratory: Clear to auscultation bilaterally. GI: Soft, nontender, non-distended  MS: No edema; No deformity. Neuro:  Nonfocal  Psych: Normal affect   Labs    Chemistry Recent Labs  Lab  11/06/18 0430  11/07/18 0432  11/09/18 0255 11/10/18 0448 11/11/18 0429  NA 145   < > 145   < > 148* 141 138  K 5.1   < > 4.5   < > 4.1 4.0 3.9  CL 110  --  108   < > 114* 109 106  CO2 24  --  21*   < > 28 23 24   GLUCOSE 144*  --  138*   < > 107* 117* 117*  BUN 40*  --  63*   < > 46* 26* 19  CREATININE 4.59*  --  5.60*   < > 1.89* 1.16 1.01  CALCIUM 7.6*  --  7.8*   < > 8.7* 8.5* 8.8*  PROT 5.3*  --  5.6*  --  5.8*  --   --   ALBUMIN 2.8*  --  2.6*  --  2.7*  --   --   AST 145*  --  66*  --  50*  --   --   ALT 49*  --  19  --  21  --   --   ALKPHOS 44  --  48  --  54  --   --  BILITOT 0.7  --  1.0  --  2.0*  --   --   GFRNONAA 13*  --  10*   < > 37* >60 >60  GFRAA 15*  --  12*   < > 43* >60 >60  ANIONGAP 11  --  16*   < > 6 9 8    < > = values in this interval not displayed.     Hematology Recent Labs  Lab 11/09/18 0255 11/10/18 0448 11/11/18 0429  WBC 12.0* 11.6* 14.8*  RBC 4.33 4.20* 4.41  HGB 12.6* 12.3* 12.9*  HCT 40.3 39.0 40.3  MCV 93.1 92.9 91.4  MCH 29.1 29.3 29.3  MCHC 31.3 31.5 32.0  RDW 16.5* 15.8* 15.2  PLT 103* 92* 124*    Cardiac Enzymes Recent Labs  Lab 11/05/18 0829 11/05/18 1446 11/05/18 2005 11/06/18 1108  TROPONINI 2.02* 5.71* 5.23* 1.00*   No results for input(s): TROPIPOC in the last 168 hours.   BNP Recent Labs  Lab 11/11/18 0429  BNP 43.1     DDimer  Recent Labs  Lab 11/05/18 0312 11/05/18 0446  DDIMER >20.00* >20.00*     Radiology    No results found.  Cardiac Studies   11/05/18 ECHO IMPRESSIONS  1. The left ventricle has severely reduced systolic function, with an ejection fraction of 20-25%. The cavity size was normal. There is moderately increased left ventricular wall thickness. Left ventricular diastolic Doppler parameters are consistent  with impaired relaxation Left ventricular diffuse hypokinesis. 2. The right ventricle has moderately reduced systolic function. The cavity was mildly enlarged. There is  mildly increased right ventricular wall thickness. 3. The mitral valve is normal in structure. No evidence of mitral valve stenosis. No significant regurgitation. 4. The tricuspid valve is normal in structure. 5. The aortic valve is tricuspid no stenosis of the aortic valve. 6. The pulmonic valve was normal in structure. 7. The aortic root and ascending aorta are normal in size and structure. 8. No complete TR doppler jet so unable to estimate PA systolic pressure.  Echo today pending  Patient Profile     63 y.o. male with hx AAA, HTN, HLP, fibromuscular dysplasia s/p bilateral renal artery angioplasty in 2006and admitted for AAA rupture  Assessment & Plan    1. S/p Ruptured AAA evening of 2/14 with emergent operative repair early  2/15 am plan for discharge this evening  2. CPR with transient STE in [place] prior to transfer;  Resolution of STE on subsequent ECG. Repeat ECG without recurrence of STE; LVH with NDT changes  3. Acute systolic heart failure;  EF 20 - 25% on echo; trop peak 5.71 with trending downward.  Diffuse hypokinesis probably contributed by transient shock/cardiac arrest.    euvolemic today  Echo just completed.  Will have pt follow up with Dr. Aundra Dubin with low EF  If not then Dr. Claiborne Billings  4. AKI  Cr increased to peak 5.6 making urine. Continues with excellent diuresis  -2644  followed by - 4004;  Cr 5.60 > 5.23 > 2.57 > 1.89 > 1.16 > 1.01 today.  5. HTN:  Continues to be elevated; has been 185/96;  now 141/94 to 155/115 -- Home meds reviewed: olmesartan 40 mg, clonidine.  Will not  re-initiate with AKI.  Started Amlodipine 10 mg daily and with LV dysfunction coreg initally at 6.25 mg bid with  PRN Hydralazine if needed.  BP 2/19:  158/97; HR 94 carvedilol titrated to 12.5 mg bid.  Increased  yesterday to 18.75 mg twice a day with ultimate plans to titrate to 25 mg twice a day as tolerated.  In the future will most likely be able to reinstitute  low-dose ARB therapy but will allow more time following resolution of his initial acute kidney injury.  6. HLD: on atorvastatin 80 mg. LDL 65, T chol 128 and TG 109 today  Per Dr. Claiborne Billings --May ultimately need R and Left heart cath after recovery of renal function, ? Timing;  Doubt acute MI and cardiomyopathy related to shock/hypoperfusion and cardiac arrest.       For questions or updates, please contact [place] Please consult [email or web address] for contact info under      Signed, Cecilie Kicks, NP  11/11/2018, 9:03 AM    Patient seen and examined. Agree with assessment and plan.  Repeat Echo done today now shows normalization of LV function from prior EF of 20 to 25% 1 week ago in the setting of transient shock/cardiac arrest;  EF now of 55 to 60%.  Moderate concentric left ventricular hypertrophy.  Blood pressure continues to gradually improve.  Renal function has normalized from a creatinine of 5.6 down to 1.01 today.  He continues to be mildly tachycardic.  With his large size, I will further titrate carvedilol to 25 mg twice a day.  With restoration of normal function, at this point he does not need a right and left heart cardiac catheterization.  In the future, would consider outpatient ischemic assessment.  According to the wife, reportedly the patient has had prior cardiac catheterization in the past and was not found to have significant obstructive disease.  His renal function continues to remain stable, if blood pressure remains elevated as outpatient ARB therapy can be reinstituted.   Troy Sine, MD, [facility] 11/11/2018 11:30 AM

## 2018-11-14 ENCOUNTER — Other Ambulatory Visit: Payer: Self-pay

## 2018-11-14 ENCOUNTER — Observation Stay (HOSPITAL_COMMUNITY)
Admission: EM | Admit: 2018-11-14 | Discharge: 2018-11-15 | Disposition: A | Discharge status: Home Health | Payer: 59 | Attending: Vascular Surgery | Admitting: Vascular Surgery

## 2018-11-14 ENCOUNTER — Emergency Department (HOSPITAL_COMMUNITY): Payer: 59

## 2018-11-14 DIAGNOSIS — L7632 Postprocedural hematoma of skin and subcutaneous tissue following other procedure: Secondary | ICD-10-CM | POA: Diagnosis not present

## 2018-11-14 DIAGNOSIS — R42 Dizziness and giddiness: Secondary | ICD-10-CM | POA: Diagnosis not present

## 2018-11-14 DIAGNOSIS — E785 Hyperlipidemia, unspecified: Secondary | ICD-10-CM | POA: Insufficient documentation

## 2018-11-14 DIAGNOSIS — K91840 Postprocedural hemorrhage and hematoma of a digestive system organ or structure following a digestive system procedure: Secondary | ICD-10-CM | POA: Diagnosis not present

## 2018-11-14 DIAGNOSIS — T148XXA Other injury of unspecified body region, initial encounter: Secondary | ICD-10-CM | POA: Diagnosis present

## 2018-11-14 DIAGNOSIS — I713 Abdominal aortic aneurysm, ruptured, unspecified: Secondary | ICD-10-CM

## 2018-11-14 DIAGNOSIS — M7981 Nontraumatic hematoma of soft tissue: Secondary | ICD-10-CM | POA: Diagnosis not present

## 2018-11-14 DIAGNOSIS — Z8249 Family history of ischemic heart disease and other diseases of the circulatory system: Secondary | ICD-10-CM | POA: Insufficient documentation

## 2018-11-14 DIAGNOSIS — Z79899 Other long term (current) drug therapy: Secondary | ICD-10-CM | POA: Diagnosis not present

## 2018-11-14 DIAGNOSIS — I10 Essential (primary) hypertension: Secondary | ICD-10-CM | POA: Diagnosis not present

## 2018-11-14 DIAGNOSIS — Z7982 Long term (current) use of aspirin: Secondary | ICD-10-CM | POA: Insufficient documentation

## 2018-11-14 LAB — CBC WITH DIFFERENTIAL/PLATELET
Abs Immature Granulocytes: 0.16 10*3/uL — ABNORMAL HIGH (ref 0.00–0.07)
Basophils Absolute: 0 10*3/uL (ref 0.0–0.1)
Basophils Relative: 0 %
EOS PCT: 1 %
Eosinophils Absolute: 0.1 10*3/uL (ref 0.0–0.5)
HCT: 41 % (ref 39.0–52.0)
Hemoglobin: 13.2 g/dL (ref 13.0–17.0)
Immature Granulocytes: 1 %
LYMPHS ABS: 1.1 10*3/uL (ref 0.7–4.0)
Lymphocytes Relative: 8 %
MCH: 29 pg (ref 26.0–34.0)
MCHC: 32.2 g/dL (ref 30.0–36.0)
MCV: 90.1 fL (ref 80.0–100.0)
Monocytes Absolute: 0.8 10*3/uL (ref 0.1–1.0)
Monocytes Relative: 6 %
Neutro Abs: 11.7 10*3/uL — ABNORMAL HIGH (ref 1.7–7.7)
Neutrophils Relative %: 84 %
Platelets: 239 10*3/uL (ref 150–400)
RBC: 4.55 MIL/uL (ref 4.22–5.81)
RDW: 15 % (ref 11.5–15.5)
WBC: 14 10*3/uL — ABNORMAL HIGH (ref 4.0–10.5)
nRBC: 0 % (ref 0.0–0.2)

## 2018-11-14 LAB — BASIC METABOLIC PANEL
Anion gap: 10 (ref 5–15)
BUN: 15 mg/dL (ref 8–23)
CO2: 22 mmol/L (ref 22–32)
Calcium: 8.4 mg/dL — ABNORMAL LOW (ref 8.9–10.3)
Chloride: 103 mmol/L (ref 98–111)
Creatinine, Ser: 0.9 mg/dL (ref 0.61–1.24)
GFR calc Af Amer: >60 mL/min (ref >60)
GFR calc non Af Amer: >60 mL/min (ref >60)
Glucose, Bld: 129 mg/dL — ABNORMAL HIGH (ref 70–99)
Potassium: 3.6 mmol/L (ref 3.5–5.1)
Sodium: 135 mmol/L (ref 135–145)

## 2018-11-14 LAB — PROTIME-INR
INR: 1.3
Prothrombin Time: 16.3 seconds — ABNORMAL HIGH (ref 11.4–15.2)

## 2018-11-14 LAB — APTT: aPTT: 30 seconds (ref 24–36)

## 2018-11-14 LAB — TYPE AND SCREEN
ABO/RH(D): O POS
Antibody Screen: NEGATIVE

## 2018-11-14 LAB — I-STAT CREATININE, ED: Creatinine, Ser: 0.9 mg/dL (ref 0.61–1.24)

## 2018-11-14 MED ORDER — IOPAMIDOL (ISOVUE-370) INJECTION 76%
100.0000 mL | Freq: Once | INTRAVENOUS | Status: AC | PRN
  Administered 2018-11-14: 100 mL via INTRAVENOUS

## 2018-11-14 MED ORDER — IOPAMIDOL (ISOVUE-370) INJECTION 76%
INTRAVENOUS | Status: AC
  Filled 2018-11-14: qty 100

## 2018-11-14 NOTE — ED Notes (Signed)
ED Provider at bedside. 

## 2018-11-14 NOTE — ED Provider Notes (Signed)
[facility] EMERGENCY DEPARTMENT Provider Note   CSN: [id number] Arrival date & time: 11/14/18  2106    History   Chief Complaint Chief Complaint  Patient presents with  . Post-op Problem    HPI Robert [name] is a 63 y.o. male.     HPI  63 year old male with recent AAA repair presents with bleeding from his surgical site.  A couple hours ago he was using the urinal and significant other noticed that he had blood at the inferior portion of his wound.  This has been a slow ooze ever since but any gauze she puts becomes soaked eventually.  The patient started to feel little bit dizzy but he specifically denies abdominal pain, flank, or back pain.  No distention noted compared to his baseline. He is on aspirin, no other blood thinners.   Past Medical History:  Diagnosis Date  . AAA (abdominal aortic aneurysm, ruptured) ([place])   . Fibromuscular dysplasia of renal artery (HCC)    s/p angioplasty to both renal arteries in 2006  . History of cardiac catheterization    LHC in 2013:  no sig CAD  . Hyperlipidemia   . Hypertension     Patient Active Problem List   Diagnosis Date Noted  . Acute renal failure ([place])   . Cardiomyopathy ([place])   . Cardiac arrest ([place])   . Ruptured abdominal aortic aneurysm (AAA) ([place]) 11/05/2018  . AAA (abdominal aortic aneurysm, ruptured) ([place]) 11/05/2018  . Umbilical hernia 07/18/2535    Past Surgical History:  Procedure Laterality Date  . ABDOMINAL AORTIC ANEURYSM REPAIR N/A 11/05/2018   Procedure: ANEURYSM ABDOMINAL AORTIC REPAIR WITH HEMASHIELD GOLD VELOUR VASCULAR GRAFT;  Surgeon: Elam Dutch, MD;  Location: [facility] OR;  Service: Vascular;  Laterality: N/A;  . CARDIAC SURGERY  2006   stent placement  . HERNIA REPAIR    . INSERTION OF MESH  08/31/2012   Procedure: INSERTION OF MESH;  Surgeon: Imogene Burn. Georgette Dover, MD;  Location: [place];  Service: General;  Laterality: N/A;  . KNEE SURGERY  2009/2010   right  . KNEE SURGERY  2010   right knee partial replacement  . LEFT HEART CATHETERIZATION WITH CORONARY ANGIOGRAM N/A 05/19/2012   Procedure: LEFT HEART CATHETERIZATION WITH CORONARY ANGIOGRAM;  Surgeon: Pixie Casino, MD;  Location: [facility] CATH LAB;  Service: Cardiovascular;  Laterality: N/A;  . UMBILICAL HERNIA REPAIR  08/31/2012   Procedure: HERNIA REPAIR UMBILICAL ADULT;  Surgeon: Imogene Burn. Georgette Dover, MD;  Location: [place];  Service: General;  Laterality: N/A;  umbilical hernia repair with mesh        Home Medications    Prior to Admission medications   Medication Sig Start Date End Date Taking? Authorizing Provider  amLODipine (NORVASC) 10 MG tablet Take 1 tablet (10 mg total) by mouth daily. 11/12/18  Yes Ulyses Amor, PA-C  aspirin EC 81 MG tablet Take 81 mg by mouth daily.    Yes [provider]  carvedilol (COREG) 25 MG tablet Take 1 tablet (25 mg total) by mouth 2 (two) times daily with a meal. 11/11/18  Yes Laurence Slate M, PA-C  rosuvastatin (CRESTOR) 20 MG tablet Take 20 mg by mouth daily.   Yes [provider]  oxyCODONE-acetaminophen (PERCOCET) 5-325 MG tablet Take 1 tablet by mouth every 6 (six) hours as needed for severe pain. 11/11/18   Gabriel Earing, PA-C    Family History Family History  Problem Relation Age of  Onset  . Heart disease Father   . Cancer Paternal Uncle        prostate  . Cancer Paternal Uncle        prostate    Social History Social History   Tobacco Use  . Smoking status: Never Smoker  . Smokeless tobacco: Never Used  Substance Use Topics  . Alcohol use: Yes  . Drug use: Yes    Types: Marijuana     Allergies   Patient has no known allergies.   Review of Systems Review of Systems  Respiratory: Negative for shortness of breath.   Gastrointestinal: Negative for abdominal pain.  Skin: Positive for wound.  Neurological: Positive for light-headedness.  All other systems reviewed and are  negative.    Physical Exam Updated Vital Signs BP (!) 143/96   Pulse 94   Temp 98.4 F (36.9 C) (Oral)   Resp (!) 26   SpO2 93%   Physical Exam Vitals signs and nursing note reviewed.  Constitutional:      Appearance: He is well-developed.  HENT:     Head: Normocephalic and atraumatic.     Right Ear: External ear normal.     Left Ear: External ear normal.     Nose: Nose normal.  Eyes:     General:        Right eye: No discharge.        Left eye: No discharge.  Neck:     Musculoskeletal: Neck supple.  Cardiovascular:     Rate and Rhythm: Normal rate and regular rhythm.     Heart sounds: Normal heart sounds.  Pulmonary:     Effort: Pulmonary effort is normal.     Breath sounds: Normal breath sounds.  Abdominal:     Palpations: Abdomen is soft.     Tenderness: There is abdominal tenderness.     Comments: Mild generalized soreness but no specific abdominal tenderness. See picture below, at the inferior portion of his midline abdominal wound, there is mild oozing to the lateral aspects of the incision.  Skin:    General: Skin is warm and dry.  Neurological:     Mental Status: He is alert.  Psychiatric:        Mood and Affect: Mood is not anxious.        ED Treatments / Results  Labs (all labs ordered are listed, but only abnormal results are displayed) Labs Reviewed  CBC WITH DIFFERENTIAL/PLATELET - Abnormal; Notable for the following components:      Result Value   WBC 14.0 (*)    Neutro Abs 11.7 (*)    Abs Immature Granulocytes 0.16 (*)    All other components within normal limits  BASIC METABOLIC PANEL - Abnormal; Notable for the following components:   Glucose, Bld 129 (*)    Calcium 8.4 (*)    All other components within normal limits  PROTIME-INR - Abnormal; Notable for the following components:   Prothrombin Time 16.3 (*)    All other components within normal limits  APTT  I-STAT CREATININE, ED  TYPE AND SCREEN    EKG None  Radiology Ct  Angio Abd/pel W And/or Wo Contrast  Result Date: 11/14/2018 CLINICAL DATA:  63 year old male with ruptured AAA underwent emergent repair on 11/05/2018 presenting with bleeding from the abdominal incision. EXAM: CTA ABDOMEN AND PELVIS wITHOUT AND WITH CONTRAST TECHNIQUE: Multidetector CT imaging of the abdomen and pelvis was performed using the standard protocol during bolus administration of intravenous contrast. Multiplanar reconstructed  images and MIPs were obtained and reviewed to evaluate the vascular anatomy. CONTRAST:  175mL ISOVUE-370 IOPAMIDOL (ISOVUE-370) INJECTION 76% COMPARISON:  CT of the abdomen pelvis dated 02/18/2018 FINDINGS: VASCULAR Evaluation is limited due to grainy image quality, presumably from does reduction. Aorta: The abdominal aorta is tortuous and and ectatic measuring up to 2.7 cm in the greatest transverse diameter. A faint band like hypodensity in the infrarenal aorta (series 11, image 110 and series 8, image 134) likely corresponds to the AAA repair. Celiac: Patent without evidence of aneurysm, dissection, vasculitis or significant stenosis. SMA: Patent without evidence of aneurysm, dissection, vasculitis or significant stenosis. Renals: Both renal arteries are patent without evidence of aneurysm, dissection, vasculitis, fibromuscular dysplasia or significant stenosis. IMA: The IMA is not identified with certainty. Inflow: Patent without evidence of aneurysm, dissection, vasculitis or significant stenosis. Proximal Outflow: Bilateral common femoral and visualized portions of the superficial and profunda femoral arteries are patent without evidence of aneurysm, dissection, vasculitis or significant stenosis. Veins: The major veins appear unremarkable for the degree of opacification. No portal venous gas. Review of the MIP images confirms the above findings. NON-VASCULAR Lower chest: There are bibasilar subsegmental consolidative changes, likely atelectasis. Pneumonia is not excluded.  Clinical correlation is recommended. There is multi vessel coronary vascular calcification. Hepatobiliary: The liver is unremarkable. No intrahepatic biliary ductal dilatation. There is high attenuating content within the gallbladder which may represent sludge or small stones versus less likely vicarious excretion of contrast from prior study. No pericholecystic fluid or evidence of acute cholecystitis by CT. Pancreas: Unremarkable. No pancreatic ductal dilatation or surrounding inflammatory changes. Spleen: Normal in size without focal abnormality. Adrenals/Urinary Tract: The adrenal glands are unremarkable. The kidneys, visualized ureters, and urinary bladder appear unremarkable. Stomach/Bowel: There is no bowel obstruction or active inflammation. Normal appendix. Lymphatic: No adenopathy. Reproductive: The prostate and seminal vesicles are grossly unremarkable. Other: No intraperitoneal free air. There is a large amount of blood in the retroperitoneum surrounding the aorta and extending into the anterior and posterior pararenal spaces and tracking down into the pelvis. No definite extravasation of contrast identified to suggest active arterial bleed. Midline vertical anterior abdominal wall surgical incision and cutaneous staples. Musculoskeletal: Degenerative changes of the spine. No acute osseous pathology. IMPRESSION: VASCULAR Status post AAA repair with a large amount of blood in the retroperitoneum surrounding the aorta and extending into the anterior and posterior pararenal spaces and tracking down into the pelvis. This may be residual from reported AAA rupture. No definite evidence of active arterial bleed. NON-VASCULAR Bibasilar subsegmental consolidative changes, likely atelectasis. Pneumonia is not excluded. Clinical correlation is recommended. Electronically Signed   By: Anner Crete M.D.   On: 11/14/2018 23:52    Procedures Procedures (including critical care time)  Medications Ordered in  ED Medications  iopamidol (ISOVUE-370) 76 % injection 100 mL (100 mLs Intravenous Contrast Given 11/14/18 2307)     Initial Impression / Assessment and Plan / ED Course  I have reviewed the triage vital signs and the nursing notes.  Pertinent labs & imaging results that were available during my care of the patient were reviewed by me and considered in my medical decision making (see chart for details).        Patient has remained stable while in the emergency department.  Unclear exactly why he is having the small oozing.  I discussed with Dr. Oneida Alar after his CT and he will come evaluate.  Care to Dr. Wyvonnia Dusky.  Final Clinical Impressions(s) / ED  Diagnoses   Final diagnoses:  None    ED Discharge Orders    None       Sherwood Gambler, MD 11/15/18 0025

## 2018-11-14 NOTE — ED Notes (Signed)
RN informed ok for Pt to receive visitor

## 2018-11-14 NOTE — ED Triage Notes (Signed)
Pt here for abdominal incision bleeding.  Pt states he had some bleeding to site this evening.  Staples in place and bleeding controlled with gauze in the lower abdomen.

## 2018-11-14 NOTE — ED Triage Notes (Signed)
Recent AAA surgery

## 2018-11-15 ENCOUNTER — Encounter (HOSPITAL_COMMUNITY): Payer: Self-pay

## 2018-11-15 DIAGNOSIS — T148XXA Other injury of unspecified body region, initial encounter: Secondary | ICD-10-CM | POA: Diagnosis present

## 2018-11-15 MED ORDER — POTASSIUM CHLORIDE CRYS ER 20 MEQ PO TBCR
20.0000 meq | EXTENDED_RELEASE_TABLET | Freq: Once | ORAL | Status: AC
  Administered 2018-11-15: 20 meq via ORAL
  Filled 2018-11-15: qty 1

## 2018-11-15 MED ORDER — BISACODYL 10 MG RE SUPP: 10.0000 mg | Freq: Every day | RECTAL | Status: DC | PRN

## 2018-11-15 MED ORDER — LABETALOL HCL 5 MG/ML IV SOLN: 10.0000 mg | INTRAVENOUS | Status: DC | PRN

## 2018-11-15 MED ORDER — SODIUM CHLORIDE 0.9 % IV SOLN
INTRAVENOUS | Status: DC
  Administered 2018-11-15: 03:00:00 via INTRAVENOUS

## 2018-11-15 MED ORDER — CARVEDILOL 12.5 MG PO TABS
25.0000 mg | ORAL_TABLET | Freq: Two times a day (BID) | ORAL | Status: DC
  Administered 2018-11-15: 25 mg via ORAL
  Filled 2018-11-15: qty 2

## 2018-11-15 MED ORDER — MORPHINE SULFATE (PF) 2 MG/ML IV SOLN
2.0000 mg | INTRAVENOUS | Status: DC | PRN
  Administered 2018-11-15: 2 mg via INTRAVENOUS
  Filled 2018-11-15: qty 1

## 2018-11-15 MED ORDER — ONDANSETRON HCL 4 MG/2ML IJ SOLN: 4.0000 mg | Freq: Four times a day (QID) | INTRAMUSCULAR | Status: DC | PRN

## 2018-11-15 MED ORDER — OXYCODONE HCL 5 MG PO TABS: 5.0000 mg | ORAL_TABLET | ORAL | Status: DC | PRN

## 2018-11-15 MED ORDER — ALUM & MAG HYDROXIDE-SIMETH 200-200-20 MG/5ML PO SUSP: 15.0000 mL | ORAL | Status: DC | PRN

## 2018-11-15 MED ORDER — GUAIFENESIN-DM 100-10 MG/5ML PO SYRP: 15.0000 mL | ORAL_SOLUTION | ORAL | Status: DC | PRN

## 2018-11-15 MED ORDER — AMLODIPINE BESYLATE 10 MG PO TABS
10.0000 mg | ORAL_TABLET | Freq: Every day | ORAL | Status: DC
  Administered 2018-11-15: 10 mg via ORAL
  Filled 2018-11-15: qty 1

## 2018-11-15 MED ORDER — METOPROLOL TARTRATE 5 MG/5ML IV SOLN: 2.0000 mg | INTRAVENOUS | Status: DC | PRN

## 2018-11-15 MED ORDER — ROSUVASTATIN CALCIUM 20 MG PO TABS
20.0000 mg | ORAL_TABLET | Freq: Every day | ORAL | Status: DC
  Filled 2018-11-15: qty 1

## 2018-11-15 MED ORDER — ASPIRIN EC 81 MG PO TBEC
81.0000 mg | DELAYED_RELEASE_TABLET | Freq: Every day | ORAL | Status: DC
  Administered 2018-11-15: 81 mg via ORAL
  Filled 2018-11-15: qty 1

## 2018-11-15 MED ORDER — PHENOL 1.4 % MT LIQD: 1.0000 | OROMUCOSAL | Status: DC | PRN

## 2018-11-15 MED ORDER — DOCUSATE SODIUM 100 MG PO CAPS
100.0000 mg | ORAL_CAPSULE | Freq: Two times a day (BID) | ORAL | Status: DC
  Administered 2018-11-15 (×2): 100 mg via ORAL
  Filled 2018-11-15 (×2): qty 1

## 2018-11-15 MED ORDER — ACETAMINOPHEN 325 MG RE SUPP: 325.0000 mg | RECTAL | Status: DC | PRN

## 2018-11-15 MED ORDER — ACETAMINOPHEN 325 MG PO TABS: 325.0000 mg | ORAL_TABLET | ORAL | Status: DC | PRN

## 2018-11-15 MED ORDER — HYDRALAZINE HCL 20 MG/ML IJ SOLN: 5.0000 mg | INTRAMUSCULAR | Status: DC | PRN

## 2018-11-15 MED ORDER — PANTOPRAZOLE SODIUM 40 MG PO TBEC
40.0000 mg | DELAYED_RELEASE_TABLET | Freq: Every day | ORAL | Status: DC
  Administered 2018-11-15: 40 mg via ORAL
  Filled 2018-11-15: qty 1

## 2018-11-15 NOTE — Progress Notes (Signed)
Checked on pt this afternoon.  Dressing removed and minimal drainage (decreased from this am).    Pt wants to go home.   HHRN and 3 in 1 being arranged.    Showed wife how to change dressing if she is needed to do so.    Will have pt f/u with Dr. Oneida Alar on 3/5.  They will call sooner should they have any issues.   Leontine Locket, [facility] 11/15/2018 1:47 PM

## 2018-11-15 NOTE — Care Management Note (Signed)
Case Management Note Marvetta Gibbons RN, BSN Transitions of Care Unit 4E- RN Case Manager [phone]  Patient Details  Name: Robert [name] MRN: [id number] Date of Birth: 18-Nov-1955  Subjective/Objective:  Pt presented with post op bleeding from repair of ruptured AAA.                 Action/Plan: PTA pt lived at home with wife, plan to return home with wife, orders written for [facility] and DME 3n1. Spoke with pt and wife at bedside, list provided for [facility] choice Per CMS guidelines from medicare.gov website with star ratings (copy placed in shadow chart)- per wife first choice [facility], with [facility] if [facility] unable to accept. Call made to [place] with [facility] for [facility] referral- however pt is out of network with [facility] and unable to accept referral. Call then made to [facility] with [facility] per pt choice for [facility] needs- referral has been accepted by [facility] for [facility] needs. Call made to [facility] with [facility] for 3n1 however pt's wife states she wants a particular size, pt already has standard size at home, and they do not want a bariatric size, DME order printed and given to wife to take to [facility] store to see about 3n1 that they want.   Expected Discharge Date:  11/15/18               Expected Discharge Plan:  [place]  In-House Referral:     Discharge planning Services  CM Consult  Post Acute Care Choice:  Home Health, Durable Medical Equipment Choice offered to:  Spouse, Patient  DME Arranged:  3-N-1 DME Agency:  Other - Comment  HH Arranged:  RN [place] Agency:  [facility]  Status of Service:  Completed, signed off  If discussed at [place] of Stay Meetings, dates discussed:    Discharge Disposition: home/home health   Additional Comments:  Dawayne Patricia, RN 11/15/2018, 2:32 PM

## 2018-11-15 NOTE — ED Provider Notes (Signed)
Care assumed from Dr. Ardith Dark.  Patient with recent AAA repair presenting with bleeding from his surgical site. Vitals are stable. Hemoglobin is stable.  Dr. Regenia Skeeter has discussed with Dr. Oneida Alar who will see patient.  Dr. Oneida Alar planning to admit for observation.   Robert Essex, MD 11/15/18 [phone]

## 2018-11-15 NOTE — Discharge Instructions (Signed)
Resume previous discharge instructions.    Wet to dry saline dressing changes twice daily.

## 2018-11-15 NOTE — Progress Notes (Signed)
Patient transferred to 4E05 from ED. VSS. Patient connected to telemetry box 4E14 (4E05 unavailable), CCMD verified x 2. Patient administered CHG bath, gown changed. Initial assessment completed. Will continue to monitor patient.  Lucita Lora, RN

## 2018-11-15 NOTE — Progress Notes (Addendum)
  Progress Note    11/15/2018 7:48 AM * No surgery found *  Subjective:  Sleeping soundly-said he hadn't slept all night.  Tm 99.6 now afebrile HR 90's-100's NSR 629'U-765'Y systolic 65% RA  Vitals:   11/15/18 0209 11/15/18 0457  BP: (!) 145/101 (!) 146/97  Pulse: (!) 103 94  Resp: 14 (!) 25  Temp: 99.6 F (37.6 C) 98.8 F (37.1 C)  SpO2: 95% 92%    Physical Exam: General:  No distress Lungs:  Non labored Incisions:  Mild bloody drainage from distal portion of incision where staples were removed.  There was some rundown from wound during time of getting dressing ready to repack.      Abdomen:  Soft NT  CBC    Component Value Date/Time   WBC 14.0 (H) 11/14/2018 2145   RBC 4.55 11/14/2018 2145   HGB 13.2 11/14/2018 2145   HCT 41.0 11/14/2018 2145   PLT 239 11/14/2018 2145   MCV 90.1 11/14/2018 2145   MCH 29.0 11/14/2018 2145   MCHC 32.2 11/14/2018 2145   RDW 15.0 11/14/2018 2145   LYMPHSABS 1.1 11/14/2018 2145   MONOABS 0.8 11/14/2018 2145   EOSABS 0.1 11/14/2018 2145   BASOSABS 0.0 11/14/2018 2145    BMET    Component Value Date/Time   NA 135 11/14/2018 2145   K 3.6 11/14/2018 2145   CL 103 11/14/2018 2145   CO2 22 11/14/2018 2145   GLUCOSE 129 (H) 11/14/2018 2145   BUN 15 11/14/2018 2145   CREATININE 0.90 11/14/2018 2150   CALCIUM 8.4 (L) 11/14/2018 2145   GFRNONAA >60 11/14/2018 2145   GFRAA >60 11/14/2018 2145    INR    Component Value Date/Time   INR 1.3 11/14/2018 2145     Intake/Output Summary (Last 24 hours) at 11/15/2018 0748 Last data filed at 11/15/2018 0715 Gross per 24 hour  Intake 21.03 ml  Output 550 ml  Net -528.97 ml     Assessment:  63 y.o. male is s/p:  Repair rupture AAA now admitted under 24 obs for hematoma distal incision    Plan: -4 staples removed earlier this morning and packed.  -there is some mild bloody drainage from the area when packing removed.   -Dr. Oneida Alar to inspect wound this am-will determine  if he can be discharged or need exploration.  D/w pt to not eat until Dr. Oneida Alar sees him.  He had a couple sips about an hour ago.     Leontine Locket, PA-C [facility] [phone] 11/15/2018 7:48 AM  Some bloody drainage does not look like ongoing bleeding most likely hematoma draining.  Will watch during the day today.  Most likely d/c home this evening if no signifcant drainage.  No plans for OR today  Ruta Hinds, MD [facility] [place] Office: [phone] Pager: [phone]

## 2018-11-15 NOTE — H&P (Signed)
Patient name: Robert [name] MRN: [id number] DOB: 1956/03/23 Sex: male  HPI: Robert [name] is a 63 y.o. male POD 10 from repair ruptured AAA.  Around 8 pm yesterday he started having bleeding from his incision halfway between umbilicus and end of incision which would not stop.  Wife describes about 100 cc of dark appearing blood.  He is eating and drinking ok and having normal bowel movements. Other medical problems include hypertension and hyperlipidemia which are controlled.  Past Medical History:  Diagnosis Date  . AAA (abdominal aortic aneurysm, ruptured) ([place])   . Fibromuscular dysplasia of renal artery (HCC)    s/p angioplasty to both renal arteries in 2006  . History of cardiac catheterization    LHC in 2013:  no sig CAD  . Hyperlipidemia   . Hypertension    Past Surgical History:  Procedure Laterality Date  . ABDOMINAL AORTIC ANEURYSM REPAIR N/A 11/05/2018   Procedure: ANEURYSM ABDOMINAL AORTIC REPAIR WITH HEMASHIELD GOLD VELOUR VASCULAR GRAFT;  Surgeon: Elam Dutch, MD;  Location: [facility] OR;  Service: Vascular;  Laterality: N/A;  . CARDIAC SURGERY  2006   stent placement  . HERNIA REPAIR    . INSERTION OF MESH  08/31/2012   Procedure: INSERTION OF MESH;  Surgeon: Imogene Burn. Georgette Dover, MD;  Location: [place];  Service: General;  Laterality: N/A;  . KNEE SURGERY  2009/2010   right  . KNEE SURGERY  2010   right knee partial replacement  . LEFT HEART CATHETERIZATION WITH CORONARY ANGIOGRAM N/A 05/19/2012   Procedure: LEFT HEART CATHETERIZATION WITH CORONARY ANGIOGRAM;  Surgeon: Pixie Casino, MD;  Location: [facility] CATH LAB;  Service: Cardiovascular;  Laterality: N/A;  . UMBILICAL HERNIA REPAIR  08/31/2012   Procedure: HERNIA REPAIR UMBILICAL ADULT;  Surgeon: Imogene Burn. Georgette Dover, MD;  Location: [place];  Service: General;  Laterality: N/A;  umbilical hernia repair with mesh    Family History  Problem Relation Age of Onset  . Heart  disease Father   . Cancer Paternal Uncle        prostate  . Cancer Paternal Uncle        prostate    SOCIAL HISTORY: Social History   Socioeconomic History  . Marital status: Married    Spouse name: Not on file  . Number of children: Not on file  . Years of education: Not on file  . Highest education level: Not on file  Occupational History  . Not on file  Social Needs  . Financial resource strain: Not on file  . Food insecurity:    Worry: Not on file    Inability: Not on file  . Transportation needs:    Medical: Not on file    Non-medical: Not on file  Tobacco Use  . Smoking status: Never Smoker  . Smokeless tobacco: Never Used  Substance and Sexual Activity  . Alcohol use: Yes  . Drug use: Yes    Types: Marijuana  . Sexual activity: Not on file  Lifestyle  . Physical activity:    Days per week: Not on file    Minutes per session: Not on file  . Stress: Not on file  Relationships  . Social connections:    Talks on phone: Not on file    Gets together: Not on file    Attends religious service: Not on file    Active member of club or organization: Not on file    Attends meetings of  clubs or organizations: Not on file    Relationship status: Not on file  . Intimate partner violence:    Fear of current or ex partner: Not on file    Emotionally abused: Not on file    Physically abused: Not on file    Forced sexual activity: Not on file  Other Topics Concern  . Not on file  Social History Narrative   Works with [employer]    No Known Allergies  Current Facility-Administered Medications  Medication Dose Route Frequency Provider Last Rate Last Dose  . 0.9 %  sodium chloride infusion   Intravenous Continuous Elam Dutch, MD      . acetaminophen (TYLENOL) tablet 325-650 mg  325-650 mg Oral Q4H PRN Elam Dutch, MD       Or  . acetaminophen (TYLENOL) suppository 325-650 mg  325-650 mg Rectal Q4H PRN Elam Dutch, MD      . alum & mag  hydroxide-simeth (MAALOX/MYLANTA) 200-200-20 MG/5ML suspension 15-30 mL  15-30 mL Oral Q2H PRN Elam Dutch, MD      . amLODipine (NORVASC) tablet 10 mg  10 mg Oral Daily Elam Dutch, MD      . aspirin EC tablet 81 mg  81 mg Oral Daily Novie Maggio, Jessy Oto, MD      . bisacodyl (DULCOLAX) suppository 10 mg  10 mg Rectal Daily PRN Elam Dutch, MD      . carvedilol (COREG) tablet 25 mg  25 mg Oral BID WC Yani Coventry, Jessy Oto, MD      . docusate sodium (COLACE) capsule 100 mg  100 mg Oral BID Elam Dutch, MD      . guaiFENesin-dextromethorphan (ROBITUSSIN DM) 100-10 MG/5ML syrup 15 mL  15 mL Oral Q4H PRN Elam Dutch, MD      . hydrALAZINE (APRESOLINE) injection 5 mg  5 mg Intravenous Q20 Min PRN Elam Dutch, MD      . labetalol (NORMODYNE,TRANDATE) injection 10 mg  10 mg Intravenous Q10 min PRN Elam Dutch, MD      . metoprolol tartrate (LOPRESSOR) injection 2-5 mg  2-5 mg Intravenous Q2H PRN Elam Dutch, MD      . morphine 2 MG/ML injection 2-5 mg  2-5 mg Intravenous Q1H PRN Elam Dutch, MD      . ondansetron (ZOFRAN) injection 4 mg  4 mg Intravenous Q6H PRN Elam Dutch, MD      . oxyCODONE (Oxy IR/ROXICODONE) immediate release tablet 5-10 mg  5-10 mg Oral Q4H PRN Elam Dutch, MD      . pantoprazole (PROTONIX) EC tablet 40 mg  40 mg Oral Daily Shwanda Soltis E, MD      . phenol (CHLORASEPTIC) mouth spray 1 spray  1 spray Mouth/Throat PRN Elam Dutch, MD      . potassium chloride SA (K-DUR,KLOR-CON) CR tablet 20-40 mEq  20-40 mEq Oral Once Elam Dutch, MD      . rosuvastatin (CRESTOR) tablet 20 mg  20 mg Oral Daily Elam Dutch, MD       Current Outpatient Medications  Medication Sig Dispense Refill  . amLODipine (NORVASC) 10 MG tablet Take 1 tablet (10 mg total) by mouth daily. 30 tablet 1  . aspirin EC 81 MG tablet Take 81 mg by mouth daily.     . carvedilol (COREG) 25 MG tablet Take 1 tablet (25 mg total) by mouth 2  (two) times daily with a meal.  60 tablet 1  . rosuvastatin (CRESTOR) 20 MG tablet Take 20 mg by mouth daily.    [name] Kitchen oxyCODONE-acetaminophen (PERCOCET) 5-325 MG tablet Take 1 tablet by mouth every 6 (six) hours as needed for severe pain. 30 tablet 0    ROS:   General:  No weight loss, Fever, chills  Cardiac: No recent episodes of chest pain/pressure, no shortness of breath at rest.  No shortness of breath with exertion.  Denies history of atrial fibrillation or irregular heartbeat  Pulmonary: No home oxygen, no productive cough, no hemoptysis,  No asthma or wheezing    Physical Examination  Vitals:   11/14/18 2345 11/15/18 0000 11/15/18 0030 11/15/18 0100  BP: (!) 140/93 (!) 143/96 (!) 148/90 (!) 130/96  Pulse: 94 94 91 97  Resp: 20 (!) 26 (!) 28 (!) 27  Temp:      TempSrc:      SpO2: 94% 93% 95% 94%    There is no height or weight on file to calculate BMI.  General:  Alert and oriented, no acute distress HEENT: Normal Abdomen: Soft, non-tender, non-distended, no mass, no scars Skin: No rash, some ecchymosis to left of midline, approximately 4 staples removed from incision at area of bleeding with no real fluid evacuated.  Fascia feels intact  DATA:  CBC    Component Value Date/Time   WBC 14.0 (H) 11/14/2018 2145   RBC 4.55 11/14/2018 2145   HGB 13.2 11/14/2018 2145   HCT 41.0 11/14/2018 2145   PLT 239 11/14/2018 2145   MCV 90.1 11/14/2018 2145   MCH 29.0 11/14/2018 2145   MCHC 32.2 11/14/2018 2145   RDW 15.0 11/14/2018 2145   LYMPHSABS 1.1 11/14/2018 2145   MONOABS 0.8 11/14/2018 2145   EOSABS 0.1 11/14/2018 2145   BASOSABS 0.0 11/14/2018 2145    BMET    Component Value Date/Time   NA 135 11/14/2018 2145   K 3.6 11/14/2018 2145   CL 103 11/14/2018 2145   CO2 22 11/14/2018 2145   GLUCOSE 129 (H) 11/14/2018 2145   BUN 15 11/14/2018 2145   CREATININE 0.90 11/14/2018 2150   CALCIUM 8.4 (L) 11/14/2018 2145   GFRNONAA >60 11/14/2018 2145   GFRAA >60 11/14/2018  2145     ASSESSMENT:  Incisional hematoma   PLAN:  Wound now open and packed  Will admit 23 hr obs if no further bleeding d/c home later today Will need home health wound care   Ruta Hinds, MD [facility] [place]: [phone] Pager: [phone]

## 2018-11-15 NOTE — Discharge Summary (Signed)
Discharge Summary    Robert [name] [birth year] 63 y.o. male  [id number]  Admission Date: 11/14/2018  Discharge Date: 11/15/2018  Physician: Elam Dutch, MD  Admission Diagnosis: Post opp, bleeding   HPI:   This is a 63 y.o. male POD 10 from repair ruptured AAA.  Around 8 pm yesterday he started having bleeding from his incision halfway between umbilicus and end of incision which would not stop.  Wife describes about 100 cc of dark appearing blood.  He is eating and drinking ok and having normal bowel movements. Other medical problems include hypertension and hyperlipidemia which are controlled.  Hospital Course:  The patient was admitted to the hospital after presenting to the ER with bloody drainage from his incision.  Dr. Oneida Alar removed 4 staples.  Pt did not appear to have dehiscence given fascia felt in tact.  He was admitted for 23 hr observation.   By the next morning, his dressing was changed and it had a little bloody ooze from the wound.  This was checked again later in the afternoon and there was minimal bloody ooze from the wound.  Will discharge home with [facility] for bid dressing changes.  Wife instructed on how to perform dressing changes if needed.  Will schedule appointment with Dr. Oneida Alar on 3/5 for wound check and follow up.  Message sent to the office.   The remainder of the hospital course consisted of increasing mobilization and increasing intake of solids without difficulty.  CBC    Component Value Date/Time   WBC 14.0 (H) 11/14/2018 2145   RBC 4.55 11/14/2018 2145   HGB 13.2 11/14/2018 2145   HCT 41.0 11/14/2018 2145   PLT 239 11/14/2018 2145   MCV 90.1 11/14/2018 2145   MCH 29.0 11/14/2018 2145   MCHC 32.2 11/14/2018 2145   RDW 15.0 11/14/2018 2145   LYMPHSABS 1.1 11/14/2018 2145   MONOABS 0.8 11/14/2018 2145   EOSABS 0.1 11/14/2018 2145   BASOSABS 0.0 11/14/2018 2145    BMET    Component Value Date/Time   NA 135 11/14/2018 2145   K  3.6 11/14/2018 2145   CL 103 11/14/2018 2145   CO2 22 11/14/2018 2145   GLUCOSE 129 (H) 11/14/2018 2145   BUN 15 11/14/2018 2145   CREATININE 0.90 11/14/2018 2150   CALCIUM 8.4 (L) 11/14/2018 2145   GFRNONAA >60 11/14/2018 2145   GFRAA >60 11/14/2018 2145      Discharge Instructions    Discharge patient   Complete by:  As directed    Discharge home after [facility] has been arranged.   Discharge disposition:  01-Home or Self Care   Discharge patient date:  11/15/2018      Discharge Diagnosis:  Post opp, bleeding  Secondary Diagnosis: Patient Active Problem List   Diagnosis Date Noted  . Hematoma 11/15/2018  . Acute renal failure ([place])   . Cardiomyopathy ([place])   . Cardiac arrest ([place])   . Ruptured abdominal aortic aneurysm (AAA) ([place]) 11/05/2018  . AAA (abdominal aortic aneurysm, ruptured) ([place]) 11/05/2018  . Umbilical hernia 51/88/4166   Past Medical History:  Diagnosis Date  . AAA (abdominal aortic aneurysm, ruptured) ([place])   . Fibromuscular dysplasia of renal artery (HCC)    s/p angioplasty to both renal arteries in 2006  . History of cardiac catheterization    LHC in 2013:  no sig CAD  . Hyperlipidemia   . Hypertension      Allergies as of 11/15/2018   No Known Allergies  Medication List    TAKE these medications   amLODipine 10 MG tablet Commonly known as:  NORVASC Take 1 tablet (10 mg total) by mouth daily.   aspirin EC 81 MG tablet Take 81 mg by mouth daily.   carvedilol 25 MG tablet Commonly known as:  COREG Take 1 tablet (25 mg total) by mouth 2 (two) times daily with a meal.   oxyCODONE-acetaminophen 5-325 MG tablet Commonly known as:  PERCOCET Take 1 tablet by mouth every 6 (six) hours as needed for severe pain.   rosuvastatin 20 MG tablet Commonly known as:  CRESTOR Take 20 mg by mouth daily.            Durable Medical Equipment  (From admission, onward)         Start     Ordered   11/15/18 1330  For home use only DME 3 n 1   Once     11/15/18 1329          Prescriptions given: none  Instructions: 1.  Resume previous discharge instructions 2.  Wet to dry saline dressing changes bid.   Disposition: home  Patient's condition: is Good  Follow up: 1. Dr. Oneida Alar on 11/24/2018 for wound check and follow up    Leontine Locket, PA-C [facility] [phone] 11/15/2018  1:50 PM

## 2018-11-16 ENCOUNTER — Encounter (HOSPITAL_COMMUNITY): Payer: Self-pay | Admitting: Vascular Surgery

## 2018-11-17 ENCOUNTER — Telehealth: Payer: Self-pay | Admitting: Vascular Surgery

## 2018-11-17 DIAGNOSIS — T148XXD Other injury of unspecified body region, subsequent encounter: Secondary | ICD-10-CM | POA: Diagnosis not present

## 2018-11-17 DIAGNOSIS — N179 Acute kidney failure, unspecified: Secondary | ICD-10-CM | POA: Diagnosis not present

## 2018-11-17 DIAGNOSIS — Z48812 Encounter for surgical aftercare following surgery on the circulatory system: Secondary | ICD-10-CM | POA: Diagnosis not present

## 2018-11-17 NOTE — Telephone Encounter (Signed)
sch appt lvm 11/24/2018 330pm staple removal MD

## 2018-11-17 NOTE — Telephone Encounter (Signed)
-----   Message from Pennelope Bracken sent at 11/16/2018  9:27 AM EST -----   ----- Message ----- From: Natividad Brood Sent: 11/11/2018   8:06 AM EST To: [facility] Admin Pool, [facility] Charge Pool  S/p repair of ruptured AAA.  F/u with Dr. Oneida Alar in 2 weeks (will need suture removal).  Thanks

## 2018-11-18 DIAGNOSIS — Z8679 Personal history of other diseases of the circulatory system: Secondary | ICD-10-CM | POA: Diagnosis not present

## 2018-11-18 DIAGNOSIS — Z7982 Long term (current) use of aspirin: Secondary | ICD-10-CM | POA: Diagnosis not present

## 2018-11-18 DIAGNOSIS — Z48812 Encounter for surgical aftercare following surgery on the circulatory system: Secondary | ICD-10-CM | POA: Diagnosis not present

## 2018-11-24 ENCOUNTER — Encounter: Payer: Self-pay | Admitting: Vascular Surgery

## 2018-11-24 ENCOUNTER — Other Ambulatory Visit: Payer: Self-pay

## 2018-11-24 ENCOUNTER — Ambulatory Visit (INDEPENDENT_AMBULATORY_CARE_PROVIDER_SITE_OTHER): Payer: Self-pay | Admitting: Vascular Surgery

## 2018-11-24 VITALS — BP 132/88 | HR 90 | Temp 98.0°F | Resp 20 | Ht 76.0 in | Wt 287.9 lb

## 2018-11-24 DIAGNOSIS — I713 Abdominal aortic aneurysm, ruptured, unspecified: Secondary | ICD-10-CM

## 2018-11-24 DIAGNOSIS — I729 Aneurysm of unspecified site: Secondary | ICD-10-CM | POA: Diagnosis not present

## 2018-11-24 NOTE — Progress Notes (Signed)
Patient is a 63 year old male who returns for postoperative follow-up today.  He underwent repair of a ruptured abdominal aortic aneurysm to 1/15 2020.  He states his appetite is returning.  He has apparently lost about 20 pounds.  He states his pain is well controlled.  He did have some small wound hematoma which required [place] to remove a few staples and he has a small open wound in his abdomen.  Physical exam:  Vitals:   11/24/18 1530  BP: 132/88  Pulse: 90  Resp: 20  Temp: 98 F (36.7 C)  SpO2: 95%  Weight: 287 lb 14.7 oz (130.6 kg)  Height: 6\' 4"  (1.93 m)    Abdomen: Obese area in the lower third of his midline laparotomy incision 2 cm x 1 cm x 2 cm depth with pink healthy-appearing granulation tissue.  Remainder of incision is intact with no evidence of hernia.  Assessment: Doing well status post repair of ruptured abdominal aortic aneurysm.  Patient had no evidence of thoracic aneurysm at the time of his CT scan on February 14.  Plan: Continue local wound care half of staples were removed today.  We will remove the other half in 2 weeks.  Hypertension decreased ejection fraction was seen by Dr. Claiborne Billings in hospital and consideration given for possible cardiac catheterization.  We will arrange follow-up with Dr. Claiborne Billings regarding his hypertension and possible cardiac catheterization.  Ruta Hinds, MD [facility] [place] Office: [phone] Pager: [phone]

## 2018-11-28 DIAGNOSIS — N179 Acute kidney failure, unspecified: Secondary | ICD-10-CM | POA: Diagnosis not present

## 2018-11-28 DIAGNOSIS — Z48812 Encounter for surgical aftercare following surgery on the circulatory system: Secondary | ICD-10-CM | POA: Diagnosis not present

## 2018-11-28 DIAGNOSIS — T148XXD Other injury of unspecified body region, subsequent encounter: Secondary | ICD-10-CM | POA: Diagnosis not present

## 2018-11-29 ENCOUNTER — Telehealth: Payer: Self-pay | Admitting: Cardiovascular Disease

## 2018-11-29 NOTE — Telephone Encounter (Signed)
Spoke with pt wife, they would like to follow up with dr Claiborne Billings post hosp. Dr Claiborne Billings saw her husband while he was there. Follow up scheduled

## 2018-11-29 NOTE — Telephone Encounter (Signed)
New Message         Patient was seen in the Hospital by Dr. Claiborne Billings, patient's wife would like a call back for a f/u. Pls call and advise.

## 2018-12-02 DIAGNOSIS — T148XXD Other injury of unspecified body region, subsequent encounter: Secondary | ICD-10-CM | POA: Diagnosis not present

## 2018-12-02 DIAGNOSIS — Z48812 Encounter for surgical aftercare following surgery on the circulatory system: Secondary | ICD-10-CM | POA: Diagnosis not present

## 2018-12-02 DIAGNOSIS — N179 Acute kidney failure, unspecified: Secondary | ICD-10-CM | POA: Diagnosis not present

## 2018-12-05 DIAGNOSIS — Z48812 Encounter for surgical aftercare following surgery on the circulatory system: Secondary | ICD-10-CM | POA: Diagnosis not present

## 2018-12-05 DIAGNOSIS — N179 Acute kidney failure, unspecified: Secondary | ICD-10-CM | POA: Diagnosis not present

## 2018-12-05 DIAGNOSIS — T148XXD Other injury of unspecified body region, subsequent encounter: Secondary | ICD-10-CM | POA: Diagnosis not present

## 2018-12-08 DIAGNOSIS — Z48812 Encounter for surgical aftercare following surgery on the circulatory system: Secondary | ICD-10-CM | POA: Diagnosis not present

## 2018-12-08 DIAGNOSIS — N179 Acute kidney failure, unspecified: Secondary | ICD-10-CM | POA: Diagnosis not present

## 2018-12-08 DIAGNOSIS — T148XXD Other injury of unspecified body region, subsequent encounter: Secondary | ICD-10-CM | POA: Diagnosis not present

## 2018-12-08 DIAGNOSIS — M25512 Pain in left shoulder: Secondary | ICD-10-CM | POA: Insufficient documentation

## 2018-12-09 ENCOUNTER — Ambulatory Visit (INDEPENDENT_AMBULATORY_CARE_PROVIDER_SITE_OTHER): Payer: Self-pay | Admitting: Vascular Surgery

## 2018-12-09 ENCOUNTER — Other Ambulatory Visit: Payer: Self-pay

## 2018-12-09 ENCOUNTER — Encounter: Payer: Self-pay | Admitting: Vascular Surgery

## 2018-12-09 VITALS — BP 142/98 | HR 80 | Temp 98.4°F | Resp 20 | Ht 76.0 in | Wt 271.8 lb

## 2018-12-09 DIAGNOSIS — I713 Abdominal aortic aneurysm, ruptured, unspecified: Secondary | ICD-10-CM

## 2018-12-09 DIAGNOSIS — M542 Cervicalgia: Secondary | ICD-10-CM | POA: Diagnosis not present

## 2018-12-09 DIAGNOSIS — R29898 Other symptoms and signs involving the musculoskeletal system: Secondary | ICD-10-CM | POA: Insufficient documentation

## 2018-12-09 DIAGNOSIS — M25512 Pain in left shoulder: Secondary | ICD-10-CM | POA: Diagnosis not present

## 2018-12-09 DIAGNOSIS — G8322 Monoplegia of upper limb affecting left dominant side: Secondary | ICD-10-CM | POA: Diagnosis not present

## 2018-12-09 NOTE — Progress Notes (Signed)
    Subjective:     Patient ID: Robert [name], male   DOB: 01/23/1956, 63 y.o.   MRN: [id number]  HPI 63 year old male follows up for staple removal.  Recently had half the staples removed.  He had ruptured aneurysm repaired open on January 15 of this year.  Did have packing to lower part of his incision this is healed.  Overall he is back walking without a walker does not think he is ready for work yet.   Review of Systems Fatigue    Objective:   Physical Exam Vitals:   12/09/18 0921  BP: (!) 142/98  Pulse: 80  Resp: 20  Temp: 98.4 F (36.9 C)  SpO2: 95%   Awake alert oriented Midline incision well-healed Palpable femoral pulses bilaterally     Assessment:     63 year old male status post open repair of ruptured AAA now well-healed and staples removed today.    Plan:     Out of work until April 27 Follow-up in 6 months with aortoiliac duplex at which time he can probably be seen at the two-year mark.   Shyonna Carlin C. Donzetta Matters, MD [facility] [place] Office: [phone] Pager: [phone]

## 2018-12-10 DIAGNOSIS — N179 Acute kidney failure, unspecified: Secondary | ICD-10-CM | POA: Diagnosis not present

## 2018-12-10 DIAGNOSIS — T148XXD Other injury of unspecified body region, subsequent encounter: Secondary | ICD-10-CM | POA: Diagnosis not present

## 2018-12-10 DIAGNOSIS — Z48812 Encounter for surgical aftercare following surgery on the circulatory system: Secondary | ICD-10-CM | POA: Diagnosis not present

## 2018-12-12 DIAGNOSIS — Z48812 Encounter for surgical aftercare following surgery on the circulatory system: Secondary | ICD-10-CM | POA: Diagnosis not present

## 2018-12-12 DIAGNOSIS — N179 Acute kidney failure, unspecified: Secondary | ICD-10-CM | POA: Diagnosis not present

## 2018-12-12 DIAGNOSIS — T148XXD Other injury of unspecified body region, subsequent encounter: Secondary | ICD-10-CM | POA: Diagnosis not present

## 2018-12-15 ENCOUNTER — Ambulatory Visit: Payer: 59 | Admitting: Vascular Surgery

## 2018-12-16 ENCOUNTER — Encounter: Payer: 59 | Admitting: Vascular Surgery

## 2018-12-16 DIAGNOSIS — M25512 Pain in left shoulder: Secondary | ICD-10-CM | POA: Diagnosis not present

## 2018-12-16 DIAGNOSIS — M542 Cervicalgia: Secondary | ICD-10-CM | POA: Diagnosis not present

## 2018-12-20 DIAGNOSIS — M542 Cervicalgia: Secondary | ICD-10-CM | POA: Insufficient documentation

## 2018-12-21 ENCOUNTER — Telehealth: Payer: Self-pay

## 2018-12-21 DIAGNOSIS — M542 Cervicalgia: Secondary | ICD-10-CM | POA: Diagnosis not present

## 2018-12-21 DIAGNOSIS — M25512 Pain in left shoulder: Secondary | ICD-10-CM | POA: Diagnosis not present

## 2018-12-21 NOTE — Telephone Encounter (Signed)
Called patient, he has been recently discharged from the hospital, but states he will wait a few weeks to be rescheduled. Patient given options of rescheduling, telephone, or video visit; patient decided on rescheduling. Patient will call if he has issues.       Primary Cardiologist:  Shelva Majestic, MD (new patient, seen in hospital)   Patient contacted.  History reviewed.  No symptoms to suggest any unstable cardiac conditions.  Based on discussion, with current pandemic situation, we will be postponing this appointment for Robert [name] with a plan for f/u in 4 wks or sooner if feasible/necessary.  If symptoms change, he has been instructed to contact our office.   Routing to C19 CANCEL pool for tracking (P CV DIV CV19 CANCEL - reason for visit "other.") and assigning priority (1 = 4-6 wks, 2 = 6-12 wks, 3 = >12 wks).   Priority 1  Ena Dawley, LPN  01/25/4733 0:37 PM         .

## 2018-12-23 ENCOUNTER — Ambulatory Visit: Payer: 59 | Admitting: Cardiovascular Disease

## 2018-12-23 ENCOUNTER — Telehealth: Payer: Self-pay | Admitting: Cardiovascular Disease

## 2018-12-23 NOTE — Telephone Encounter (Signed)
Called patient and LVM to call and schedule followup with Dr. Claiborne Billings as a new patient on 01-23-2019 at 3 p.m. per Caprice Beaver.

## 2019-01-04 ENCOUNTER — Telehealth: Payer: Self-pay | Admitting: Cardiovascular Disease

## 2019-01-04 NOTE — Telephone Encounter (Signed)
New Message:    Pt did not want a Virtual Visit at this time. This appointment was a hospital follow up. Pt wants to know if it is alright for him to wait until his office visit on 05-17-19?

## 2019-01-04 NOTE — Telephone Encounter (Signed)
Called pt back but he was unaware of why I was calling and says he has his 04/2019 appt and he is fine with waiting until then.

## 2019-01-11 ENCOUNTER — Telehealth: Payer: Self-pay

## 2019-01-11 NOTE — Telephone Encounter (Signed)
Patients wife called and said that Robert [name] was scheduled to return to work on the 27th of this month but they do not feel that he is ready to go back. He is still having some discomfort and does not feel that he will be able to do the job that is required of him yet. 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  Note given to extend the patient out of work until 02/20/19.   Letter printed and ready to be signed and faxed   York Cerise, [place]

## 2019-01-17 DIAGNOSIS — M5030 Other cervical disc degeneration, unspecified cervical region: Secondary | ICD-10-CM | POA: Insufficient documentation

## 2019-01-17 DIAGNOSIS — M25512 Pain in left shoulder: Secondary | ICD-10-CM | POA: Diagnosis not present

## 2019-02-08 DIAGNOSIS — G8322 Monoplegia of upper limb affecting left dominant side: Secondary | ICD-10-CM | POA: Diagnosis not present

## 2019-02-09 DIAGNOSIS — L0292 Furuncle, unspecified: Secondary | ICD-10-CM | POA: Diagnosis not present

## 2019-02-12 DIAGNOSIS — L02415 Cutaneous abscess of right lower limb: Secondary | ICD-10-CM | POA: Diagnosis not present

## 2019-02-14 DIAGNOSIS — L723 Sebaceous cyst: Secondary | ICD-10-CM | POA: Diagnosis not present

## 2019-02-14 DIAGNOSIS — L089 Local infection of the skin and subcutaneous tissue, unspecified: Secondary | ICD-10-CM | POA: Diagnosis not present

## 2019-02-17 DIAGNOSIS — G8322 Monoplegia of upper limb affecting left dominant side: Secondary | ICD-10-CM | POA: Diagnosis not present

## 2019-02-24 ENCOUNTER — Telehealth: Payer: Self-pay

## 2019-02-24 NOTE — Telephone Encounter (Signed)
Wife called and said that she would like to get a letter for pt to take to work saying he has restrictions. She said that he went back to work on Monday but was so completely exhausted at the end of the day.   Spoke with Dr Donzetta Matters and it is ok to give a letter for him to do no heavy lifting and to not be on his feet for long periods of time. He said that he will need a visit however to see the NP in order for him to have reasoning for the restrictions as well  Left message for pt to call office to arrange.   York Cerise, CMA

## 2019-02-27 ENCOUNTER — Ambulatory Visit (INDEPENDENT_AMBULATORY_CARE_PROVIDER_SITE_OTHER): Payer: 59 | Admitting: Physician Assistant

## 2019-02-27 ENCOUNTER — Other Ambulatory Visit: Payer: Self-pay

## 2019-02-27 VITALS — BP 131/83 | HR 83 | Temp 98.6°F | Ht 76.0 in | Wt 284.2 lb

## 2019-02-27 DIAGNOSIS — I713 Abdominal aortic aneurysm, ruptured, unspecified: Secondary | ICD-10-CM

## 2019-02-27 NOTE — Progress Notes (Signed)
Established Open AAA Repair   History of Present Illness   Robert [name] is a 63 y.o. (1956-09-21) male who presents for follow up of open repair of ruptured AAA by Dr. Oneida Alar 11/05/18.  Patient states incisions are all well-healed.  He denies any claudication, rest pain, or any tissue changes of bilateral lower extremities.  Last week he returned to work for the first time since surgery.  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  He is working long hours and feels like he has no energy by the end of the shift.  After this past week he does not feel like he has the strength and energy for his line of work without some restrictions.  He returns to clinic to discuss these restrictions.  He is taking an aspirin and statin daily.  He has a follow-up with his cardiologist later this month.  He denies any shortness of breath or chest pain.   Current Outpatient Medications  Medication Sig Dispense Refill  . amLODipine (NORVASC) 10 MG tablet Take 1 tablet (10 mg total) by mouth daily. 30 tablet 1  . aspirin EC 81 MG tablet Take 81 mg by mouth daily.     . carvedilol (COREG) 25 MG tablet Take 1 tablet (25 mg total) by mouth 2 (two) times daily with a meal. 60 tablet 1  . oxyCODONE-acetaminophen (PERCOCET) 5-325 MG tablet Take 1 tablet by mouth every 6 (six) hours as needed for severe pain. 30 tablet 0  . rosuvastatin (CRESTOR) 20 MG tablet Take 20 mg by mouth daily.     No current facility-administered medications for this visit.     No Known Allergies  On ROS today: 10 system ROS is negative unless otherwise noted in HPI   Physical Examination   Vitals:   02/27/19 1605  BP: 131/83  Pulse: 83  Temp: 98.6 F (37 C)  TempSrc: Temporal  SpO2: 93%  Weight: 284 lb 3.2 oz (128.9 kg)  Height: 6\' 4"  (1.93 m)   Body mass index is 34.59 kg/m.  General Alert, O x  3, Obese, NAD  Pulmonary Sym exp, good B air movt, CTA B  Cardiac RRR, Nl S1, S2  Vascular Vessel Right Left  Radial Palpable Palpable  Brachial Palpable Palpable  Aorta Not palpable N/A  Femoral Palpable Palpable  Popliteal Not palpable Not palpable  PT Not palpable Not palpable  DP Palpable Palpable    Gastro- intestinal soft, non-distended, non-tender to palpation, No guarding or rebound, No palpable prominent aortic pulse, Surgical incisions well healed  Musculo- skeletal M/S 5/5 throughout  , Extremities without ischemic changes  , No edema present, No visible varicosities ,   Neurologic Pain and light touch intact in extremities , Motor exam as listed above    Non-Invasive Vascular Imaging    AOI Duplex and ABIs scheduled for August/September this year    Medical Decision Making   Robert [name] is a 63 y.o. male who presents s/p open repair of ruptured AAA.     All incisions related to open repair of AAA are well-healed  He is perfusing bilateral lower extremities well with palpable DP pulses  The nature of the patient's occupation is physically demanding.  Patient believes he is not yet ready to return to work with out restriction based on his experience at work last week.  We  discussed several restrictions that should be in place to help the patient return to work which include the following: If at all possible he should avoid long periods of standing, lifting greater than 35 pounds overhead, and lifting greater than 35 pounds originating below the waist.  He is scheduled for aortoiliac duplex in August/September at which time we will reevaluate these work restrictions.   Dagoberto Ligas PA-C [facility] [place] Office: [phone]   Clinic MD: Dr. Trula Slade

## 2019-04-25 ENCOUNTER — Other Ambulatory Visit: Payer: Self-pay

## 2019-04-25 DIAGNOSIS — I713 Abdominal aortic aneurysm, ruptured, unspecified: Secondary | ICD-10-CM

## 2019-04-28 ENCOUNTER — Ambulatory Visit (HOSPITAL_COMMUNITY)
Admission: RE | Admit: 2019-04-28 | Discharge: 2019-04-28 | Disposition: A | Discharge status: Home / Self Care | Payer: 59 | Source: Ambulatory Visit | Attending: Family | Admitting: Family

## 2019-04-28 ENCOUNTER — Other Ambulatory Visit: Payer: Self-pay

## 2019-04-28 ENCOUNTER — Ambulatory Visit (INDEPENDENT_AMBULATORY_CARE_PROVIDER_SITE_OTHER)
Admission: RE | Admit: 2019-04-28 | Discharge: 2019-04-28 | Disposition: A | Discharge status: Home / Self Care | Payer: 59 | Source: Ambulatory Visit | Attending: Family | Admitting: Family

## 2019-04-28 DIAGNOSIS — I713 Abdominal aortic aneurysm, ruptured, unspecified: Secondary | ICD-10-CM

## 2019-05-03 ENCOUNTER — Telehealth: Payer: Self-pay | Admitting: *Deleted

## 2019-05-04 ENCOUNTER — Ambulatory Visit (INDEPENDENT_AMBULATORY_CARE_PROVIDER_SITE_OTHER): Payer: 59 | Admitting: Vascular Surgery

## 2019-05-04 ENCOUNTER — Ambulatory Visit: Payer: 59 | Admitting: Vascular Surgery

## 2019-05-04 ENCOUNTER — Other Ambulatory Visit: Payer: Self-pay

## 2019-05-04 ENCOUNTER — Encounter: Payer: Self-pay | Admitting: Vascular Surgery

## 2019-05-04 DIAGNOSIS — I714 Abdominal aortic aneurysm, without rupture, unspecified: Secondary | ICD-10-CM

## 2019-05-04 NOTE — Progress Notes (Signed)
.   Virtual Visit via Video Note   I connected with Robert [name] on 05/04/2019 using the [employer] virtual platform and verified that I was speaking with the correct person using two identifiers. Patient was located at home and accompanied by his family. I am located at our office at [facility]   The limitations of evaluation and management by telemedicine and the availability of in person appointments have been previously discussed with the patient and are documented in the patients chart. The patient expressed understanding and consented to proceed. History of Present Illness: Robert [name] is a 63 y.o. male status post repair of ruptured abdominal aortic aneurysm February 2020.  His appetite has returned to normal.  However he still is still very overall deconditioned and has difficulty at work where he works 9-hour shifts.  He states that he can barely stand up after about 6 hours.  States the right leg is the worst he has some occasional numbness and tingling in his toes in the right foot.  Past Medical History:  Diagnosis Date  . AAA (abdominal aortic aneurysm, ruptured) ([place])   . Fibromuscular dysplasia of renal artery (HCC)    s/p angioplasty to both renal arteries in 2006  . History of cardiac catheterization    LHC in 2013:  no sig CAD  . Hyperlipidemia   . Hypertension     Past Surgical History:  Procedure Laterality Date  . ABDOMINAL AORTIC ANEURYSM REPAIR N/A 11/05/2018   Procedure: ANEURYSM ABDOMINAL AORTIC REPAIR WITH HEMASHIELD GOLD VELOUR VASCULAR GRAFT;  Surgeon: Elam Dutch, MD;  Location: [facility] OR;  Service: Vascular;  Laterality: N/A;  . CARDIAC SURGERY  2006   stent placement  . HERNIA REPAIR    . INSERTION OF MESH  08/31/2012   Procedure: INSERTION OF MESH;  Surgeon: Imogene Burn. Georgette Dover, MD;  Location: [place];  Service: General;  Laterality: N/A;  . KNEE SURGERY  2009/2010   right  . KNEE SURGERY  2010   right knee partial  replacement  . LEFT HEART CATHETERIZATION WITH CORONARY ANGIOGRAM N/A 05/19/2012   Procedure: LEFT HEART CATHETERIZATION WITH CORONARY ANGIOGRAM;  Surgeon: Pixie Casino, MD;  Location: [facility] CATH LAB;  Service: Cardiovascular;  Laterality: N/A;  . UMBILICAL HERNIA REPAIR  08/31/2012   Procedure: HERNIA REPAIR UMBILICAL ADULT;  Surgeon: Imogene Burn. Georgette Dover, MD;  Location: [place];  Service: General;  Laterality: N/A;  umbilical hernia repair with mesh    No outpatient medications have been marked as taking for the 05/04/19 encounter (Office Visit) with Elam Dutch, MD.   Review of systems: He has no shortness of breath.  He has no chest pain.  Data: Patient had a duplex ultrasound of his abdominal aorta which showed the iliac diameters were 1.2 cm but suboptimal views were obtained due to the patient's obesity  He also had bilateral ABIs which were greater than 1 triphasic and normal bilaterally  Assessment and Plan: Status post ruptured abdominal aortic aneurysm still overall deconditioned but slowly improving  The patient should be on limited work duty with 6 hours of work every day until his overall strength and conditioning improves.  He will follow-up with [place] in 6 months time to see if his physical status has returned to normal.  He will need a CT Angie of the abdomen and pelvis in 1 year.  If this there is no evidence of recurrent abdominal aortic aneurysm or iliac artery aneurysm his  follow-up can probably be spaced out to 5 to 10 years after that.   Follow Up Instructions:    I discussed the assessment and treatment plan with the patient. The patient was provided an opportunity to ask questions and all were answered. The patient agreed with the plan and demonstrated an understanding of the instructions.   The patient was advised to call back or seek an in-person evaluation if the symptoms worsen or if the condition fails to improve as anticipated.  I spent 10  minutes with the patient via video encounter.   Signed, Ruta Hinds [facility] [place] Office: [phone]

## 2019-05-17 ENCOUNTER — Ambulatory Visit: Payer: 59 | Admitting: Cardiovascular Disease

## 2019-05-17 ENCOUNTER — Other Ambulatory Visit: Payer: Self-pay

## 2019-08-08 ENCOUNTER — Ambulatory Visit: Payer: 59 | Admitting: Cardiovascular Disease

## 2019-09-01 ENCOUNTER — Other Ambulatory Visit: Payer: Self-pay

## 2019-09-01 DIAGNOSIS — Z20822 Contact with and (suspected) exposure to covid-19: Secondary | ICD-10-CM

## 2019-09-03 LAB — NOVEL CORONAVIRUS, NAA: SARS-CoV-2, NAA: NOT DETECTED

## 2020-01-29 IMAGING — CT CT ANGIO CHEST-ABD-PELV FOR DISSECTION W/ AND WO/W CM
2 of 9 series · 15 of 46 positions shown, 17 images · IV contrast (iopamidol)
Comparison: 02/18/2018

CLINICAL DATA: History of abdominal aortic aneurysm. Syncopal
episode. Suspected aortic dissection.

EXAM:
CT ANGIOGRAPHY CHEST, ABDOMEN AND PELVIS
TECHNIQUE: Multidetector CT imaging through the chest, abdomen and pelvis was
performed using the standard protocol during bolus administration of
intravenous contrast. Multiplanar reconstructed images and MIPs were
obtained and reviewed to evaluate the vascular anatomy.
CONTRAST:  100mL YPVBIJ-6XW IOPAMIDOL (YPVBIJ-6XW) INJECTION 76%

[Series 5: axial arterial · axial · arterial · 0.98mm/px · z∈[-519,+111]mm · 12 of 234 slices shown, 14 images]
[im 12/234  soft-tissue]
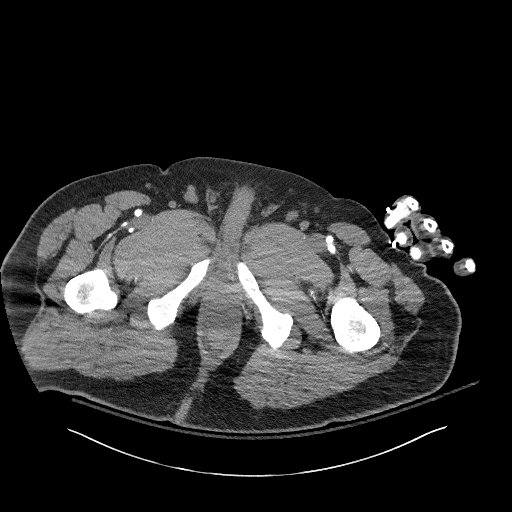
[im 12/234  bone]
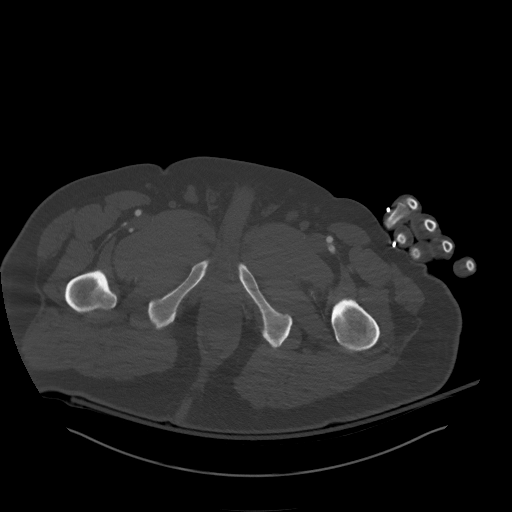
[im 35/234  soft-tissue]
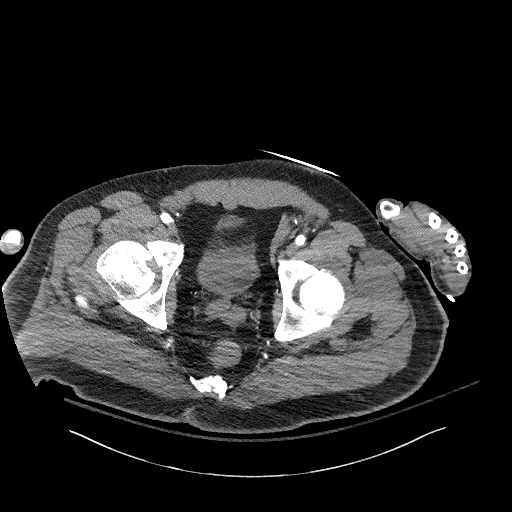
[im 47/234  soft-tissue]
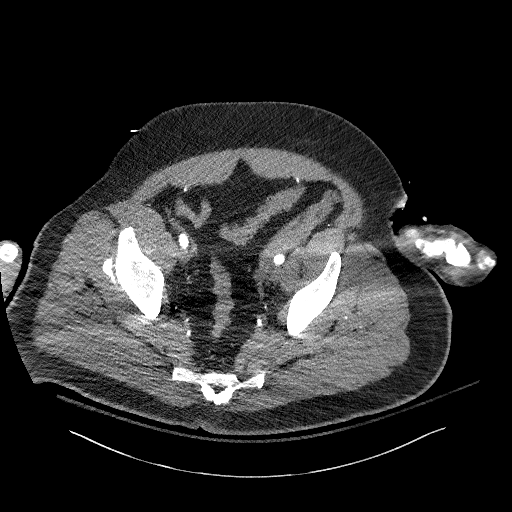
[im 70/234  soft-tissue]
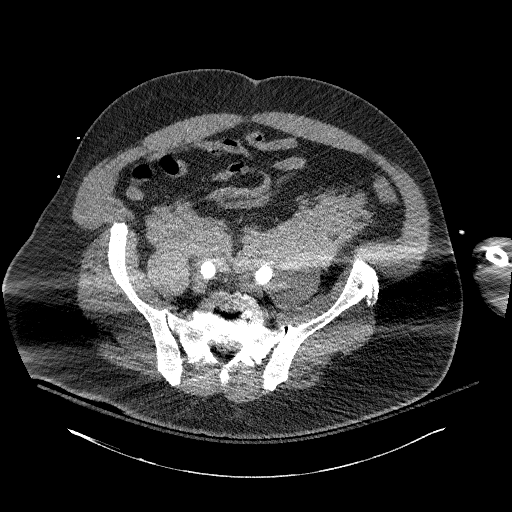
[im 94/234  soft-tissue]
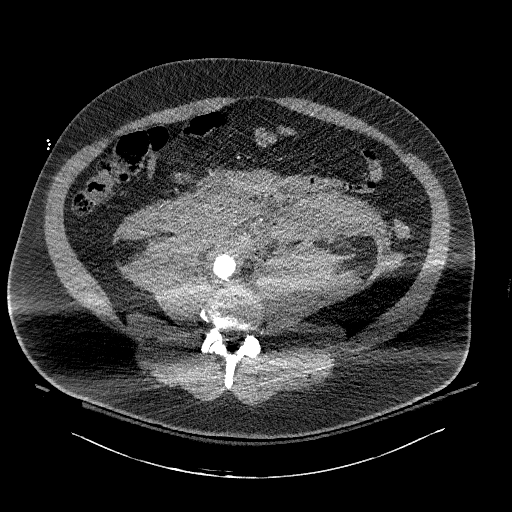
[im 105/234  soft-tissue]
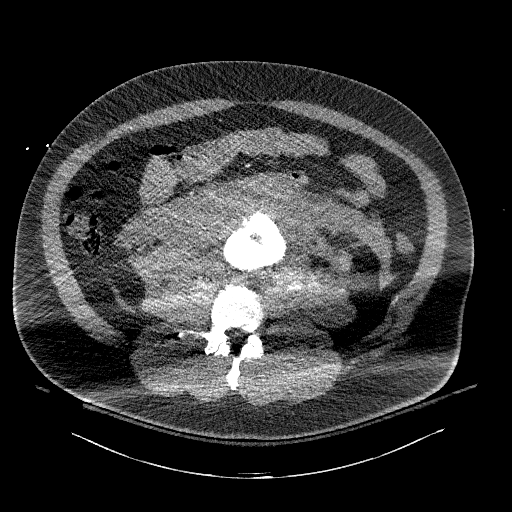
[im 129/234  soft-tissue]
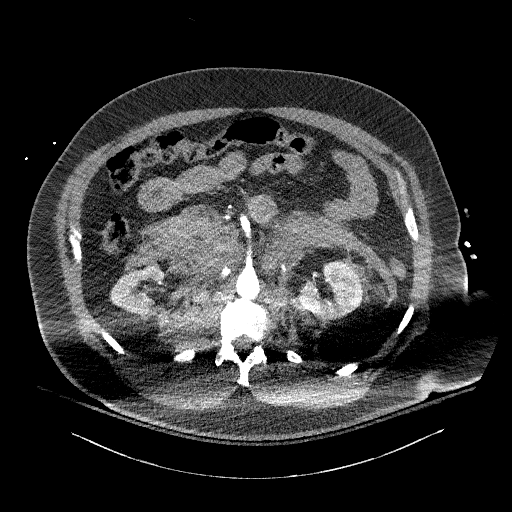
[im 140/234  soft-tissue]
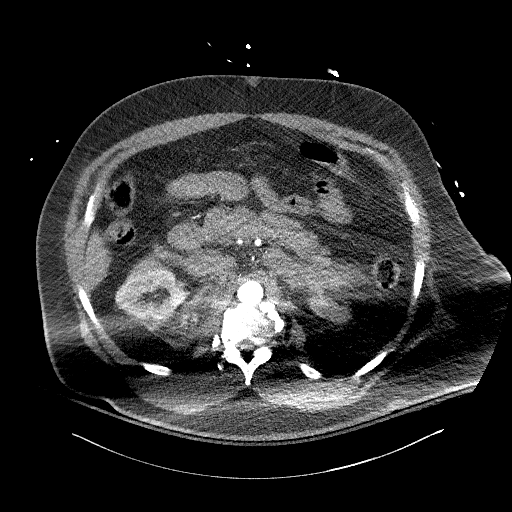
[im 164/234  soft-tissue]
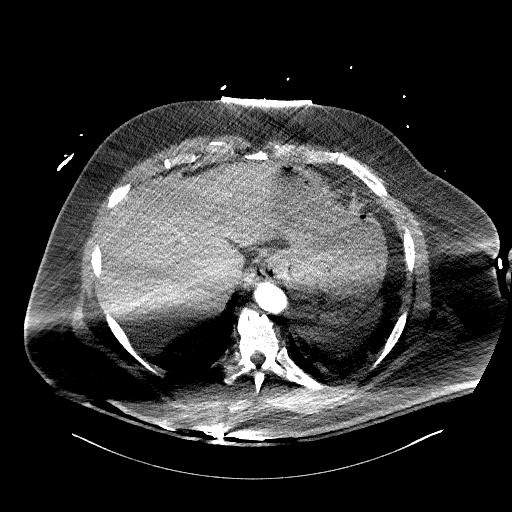
[im 164/234  bone]
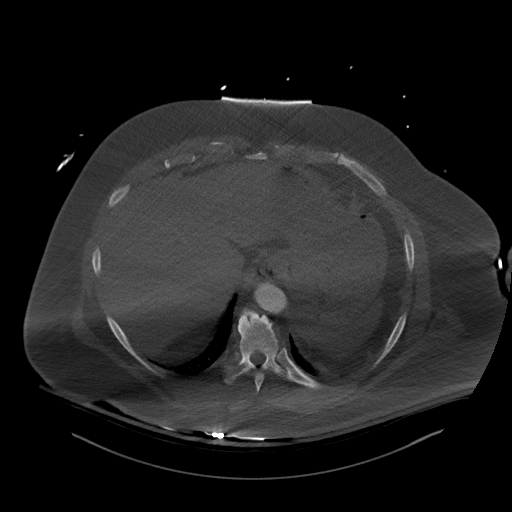
[im 187/234  soft-tissue]
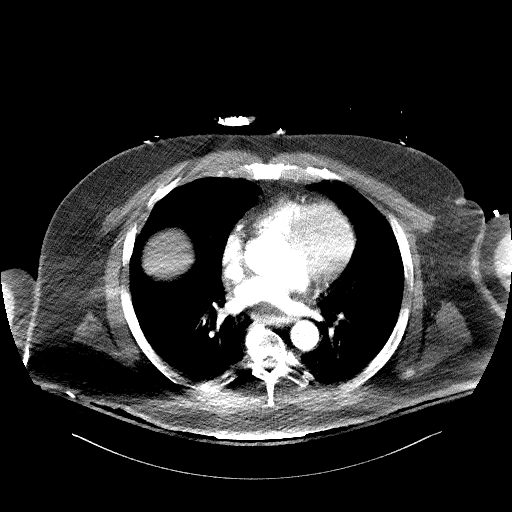
[im 199/234  soft-tissue]
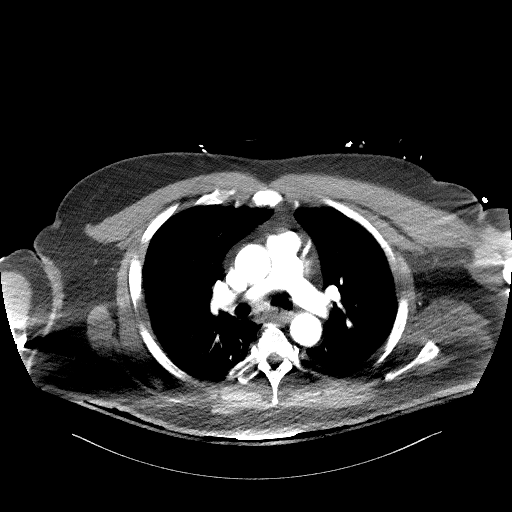
[im 222/234  soft-tissue]
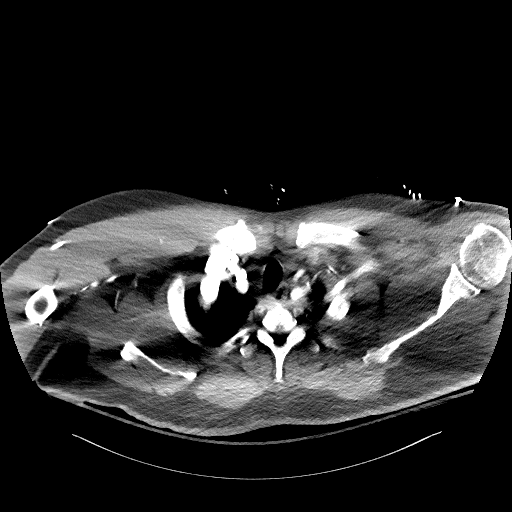

[Series 8: coronals · coronal · 1.04mm/px · 3 of 216 slices shown]
[im 54/216  soft-tissue]
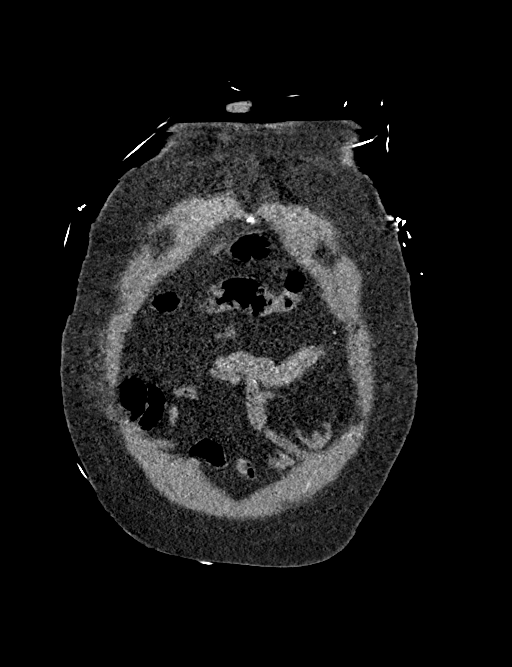
[im 108/216  soft-tissue]
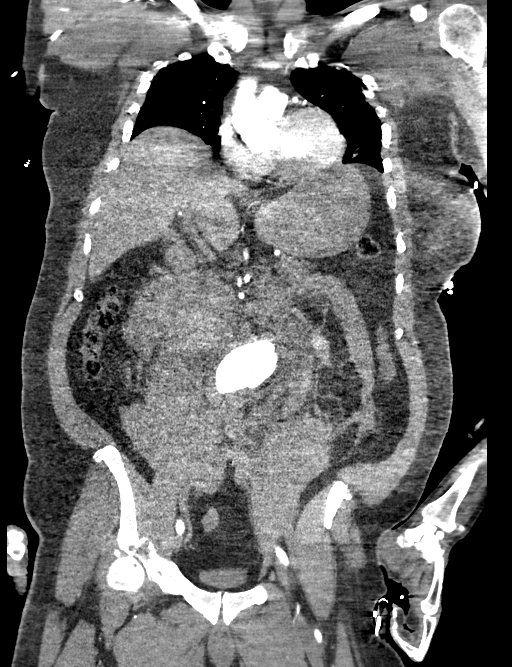
[im 162/216  soft-tissue]
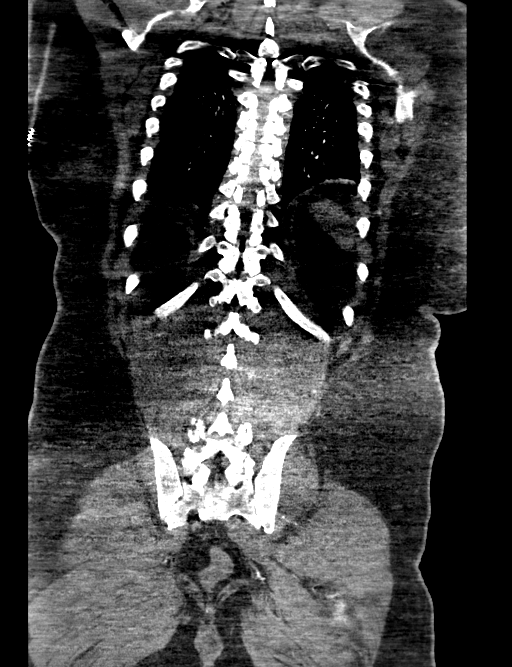

[15 of 46 positions shown; findings below may reference images not displayed]

FINDINGS: CTA CHEST FINDINGS

Cardiovascular: Heart is normal size. Minimal calcified plaque over
the left anterior descending coronary artery. Thoracic aorta is
normal in caliber without evidence of aneurysm or dissection.
Visualized pulmonary arterial system is within normal.

Mediastinum/Nodes: No mediastinal or hilar adenopathy. Small hiatal
hernia.

Lungs/Pleura: Lungs are adequately inflated without focal airspace
consolidation or effusion. Airways are normal.

Musculoskeletal: Degenerative change of the spine.

Review of the MIP images confirms the above findings.

CTA ABDOMEN AND PELVIS FINDINGS

VASCULAR

Aorta: Evidence of patient's known infrarenal fusiform abdominal
aortic aneurysm measuring 5 cm in AP diameter (previously 4.1 cm).
There is evidence of acute rupture of this aneurysm with site of
rupture anteriorly just below the level of the inferior mesenteric
artery. Moderate amount of periaortic/retroperitoneal hemorrhage.
Below the level of rupture of the aorta is well opacified.

Celiac: Normal.

SMA: Normal.

Renals: Normal.

IMA: Unopacified and not visualized.

Inflow: Left common iliac artery measures 1.6 cm in diameter
(previously 1.8 cm). Right common iliac artery normal in caliber.

Veins: Normal.

Review of the MIP images confirms the above findings.

NON-VASCULAR

Hepatobiliary: Normal.

Pancreas: Normal.

Spleen: Normal.

Adrenals/Urinary Tract: Unremarkable.

Stomach/Bowel: Mild gastric distension. Small bowel is normal.
Appendix is normal. Colon is normal.

Lymphatic: No adenopathy.

Reproductive: Normal.

Other: None.

Musculoskeletal: Degenerative change of the spine and hips.

Review of the MIP images confirms the above findings.
IMPRESSION: Evidence patient's known fusiform infrarenal abdominal aortic
aneurysm measuring 5 cm in AP diameter with evidence of acute
rupture with moderate adjacent periaortic/retroperitoneal
hemorrhage. Rupture is anteriorly just below the takeoff of the
inferior mesenteric artery.

Normal thoracic aorta.

Minimal aneurysmal dilatation of the left common iliac artery
measuring 1.6 cm (previously 1.8 cm).

Subtle atherosclerotic coronary artery disease.

Critical Value/emergent results were called by telephone at the time
of interpretation on 11/05/2018 at [DATE] a.m. to Dr. SHADWAN BESHR ,
who verbally acknowledged these results.

## 2020-01-30 IMAGING — DX DG CHEST 1V PORT
1 series · 1 of 1 positions shown · non-contrast
Comparison: November 05, 2018

CLINICAL DATA: Evaluate ETT.

EXAM:
PORTABLE CHEST 1 VIEW

[chest ap]
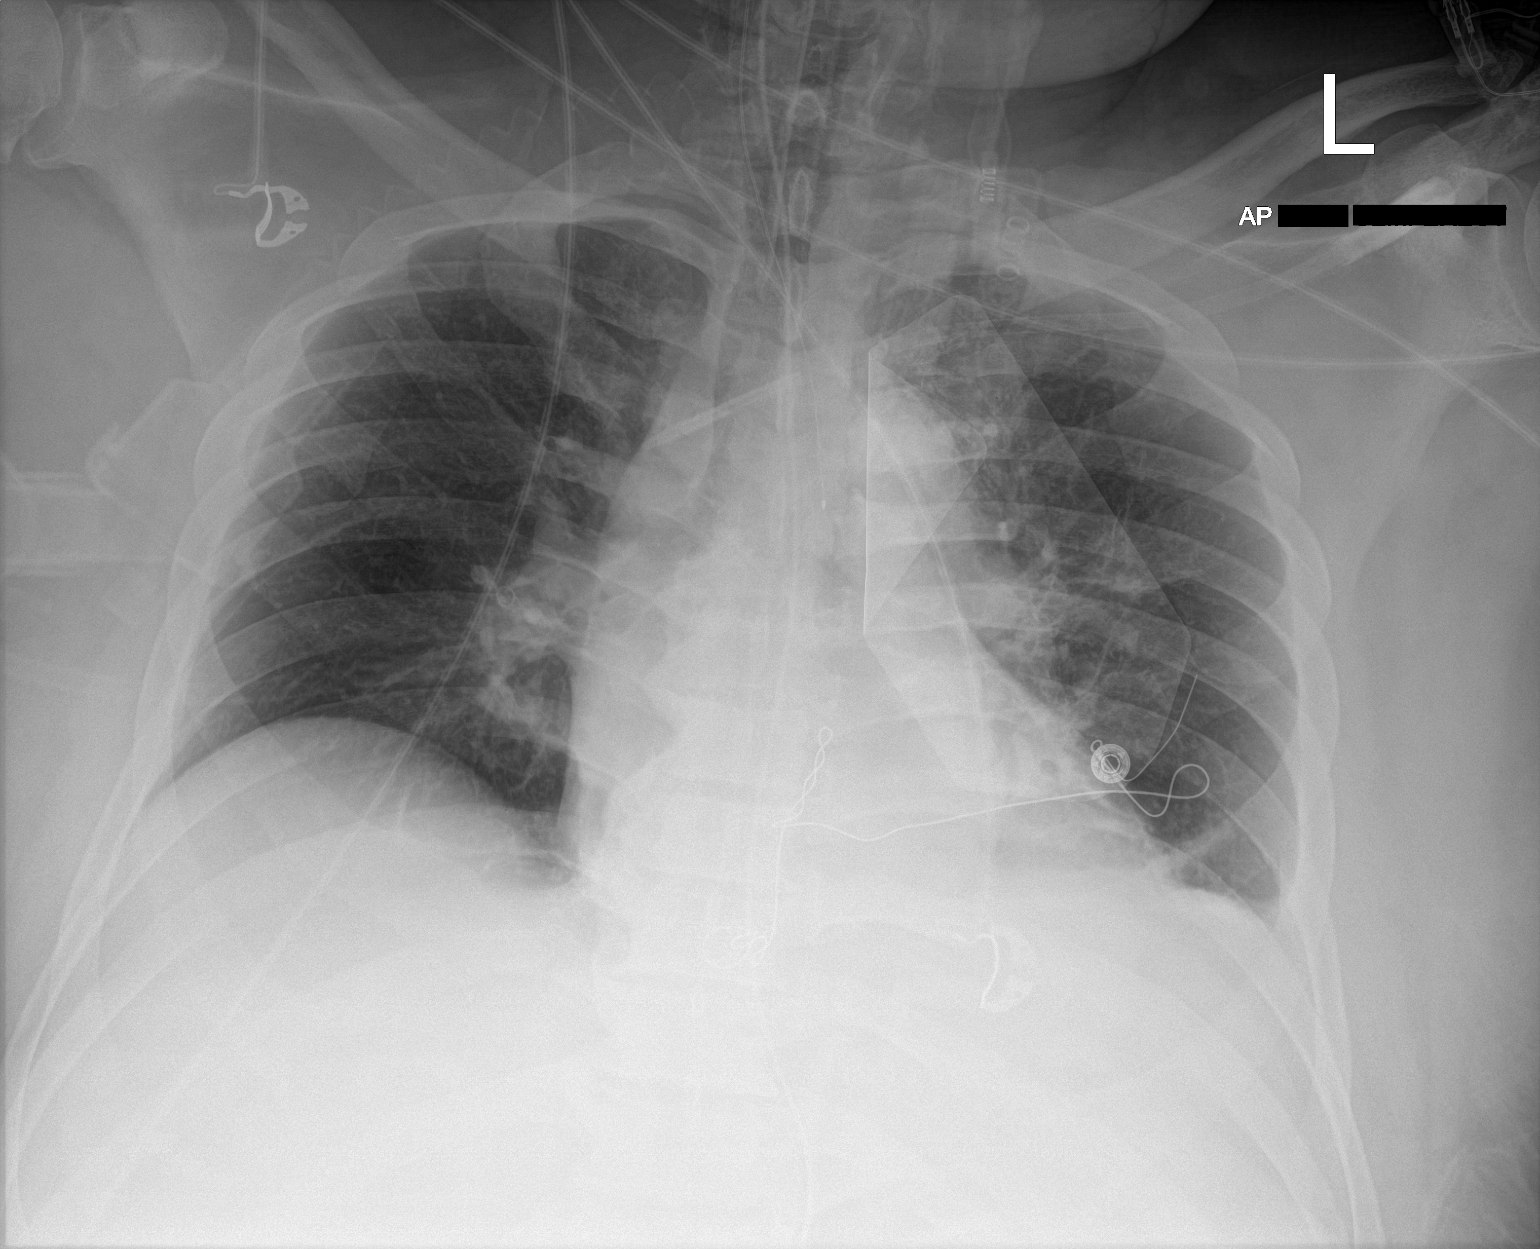

[1 of 1 positions shown; findings below may reference images not displayed]

FINDINGS: The ETT terminates 5.5 cm above the carina and 4 cm below the
thoracic inlet in good position. An NG tube terminates below today's
film. A left central line terminates in the SVC. No pneumothorax.
Atelectasis and possibly a tiny effusion in the left base. The
cardiomediastinal silhouette is stable. No other changes. A
transcutaneous pacer lead partially obscures the left chest.
IMPRESSION: 1. The ETT is in good position.  Other support apparatus as above.
2. Mild atelectasis and possible tiny associated effusion in the
left base.

## 2020-01-30 IMAGING — US US RENAL
1 series · 14 of 25 positions shown · non-contrast
Comparison: None.

CLINICAL DATA: Acute renal failure, obesity.

EXAM:
RENAL / URINARY TRACT ULTRASOUND COMPLETE

[Series 1: us renal · 14 of 30 slices shown]
[im 1/30]
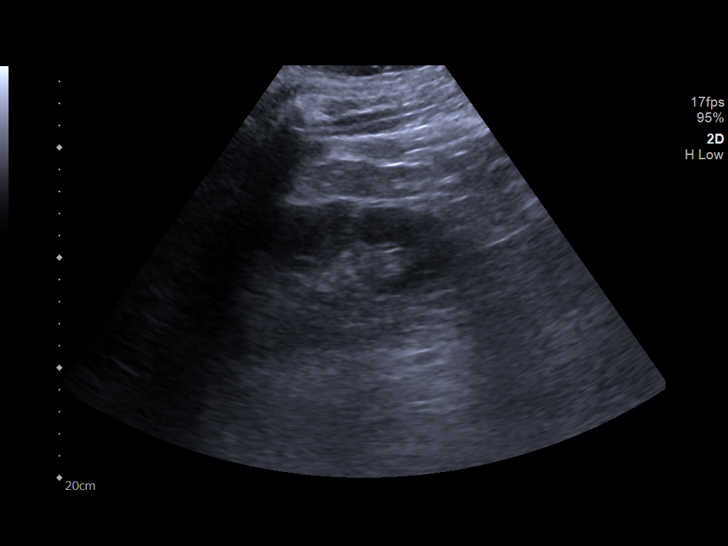
[im 3/30]
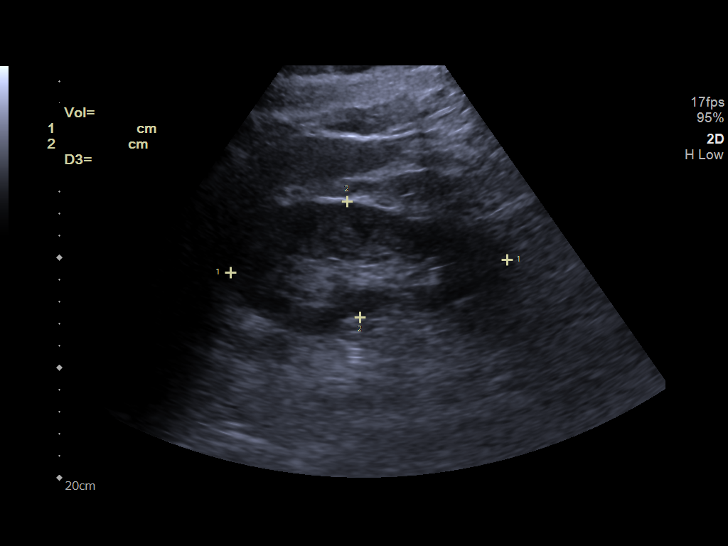
[im 5/30]
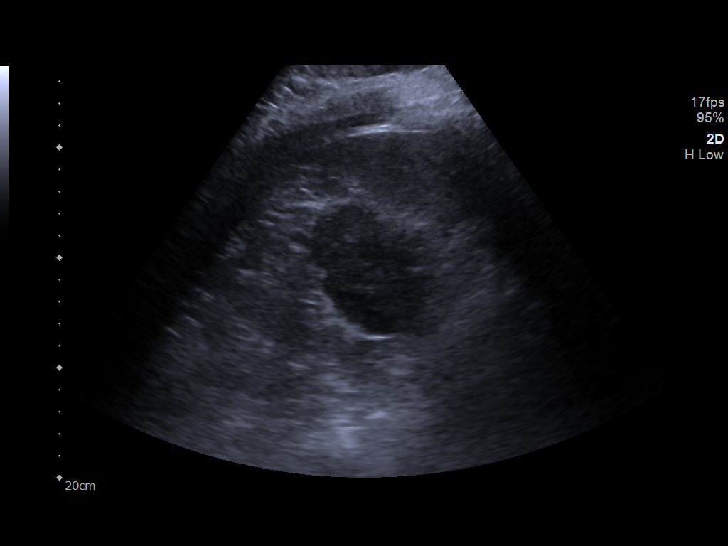
[im 8/30]
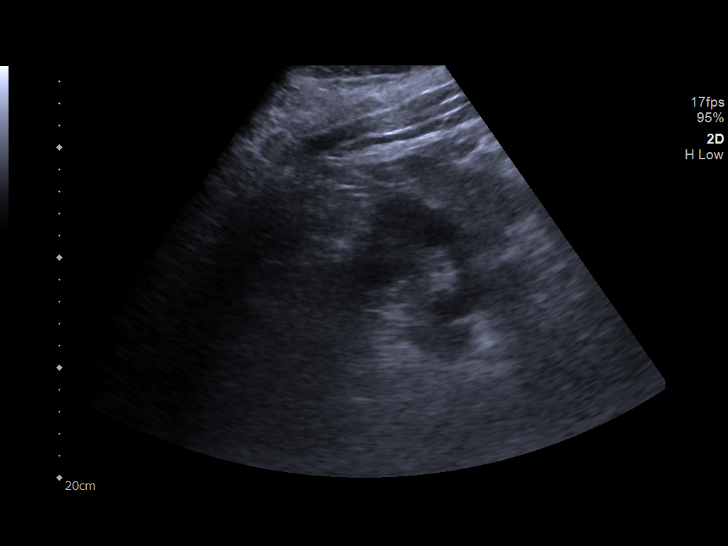
[im 10/30]
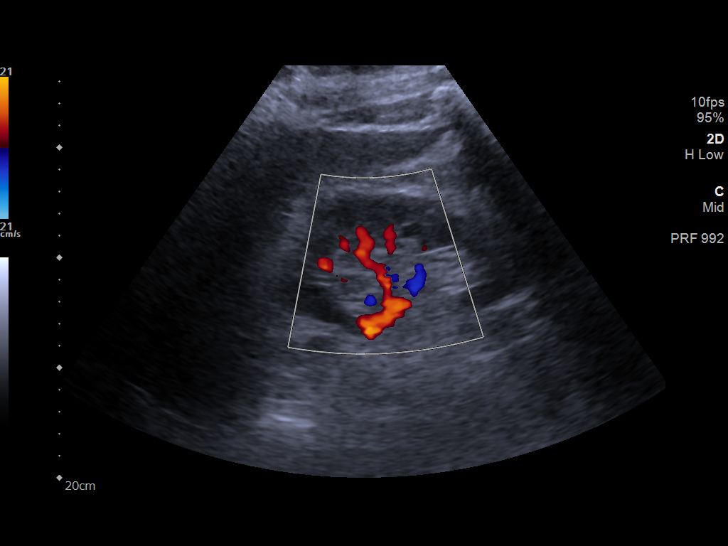
[im 11/30]
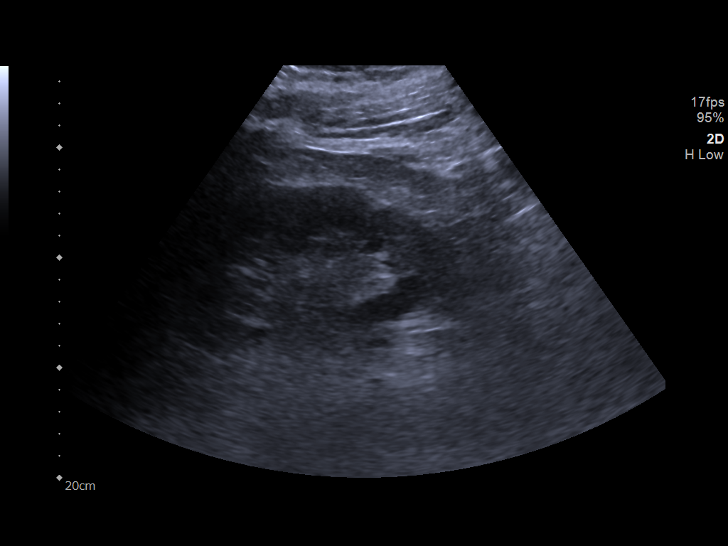
[im 14/30]
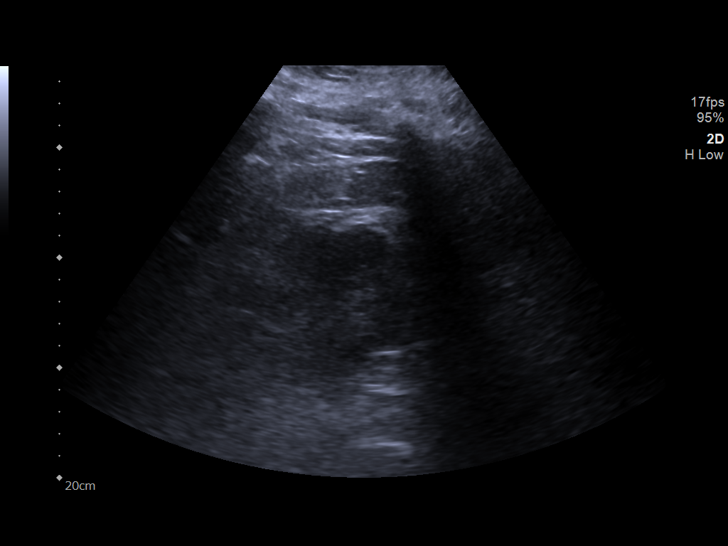
[im 16/30]
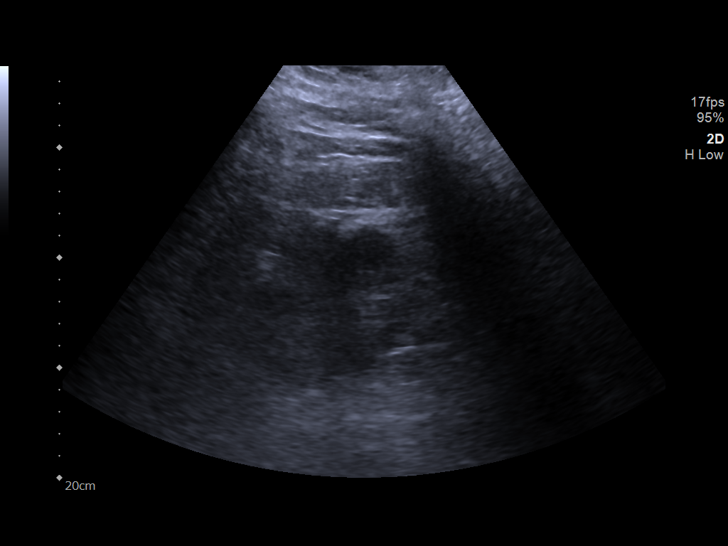
[im 19/30]
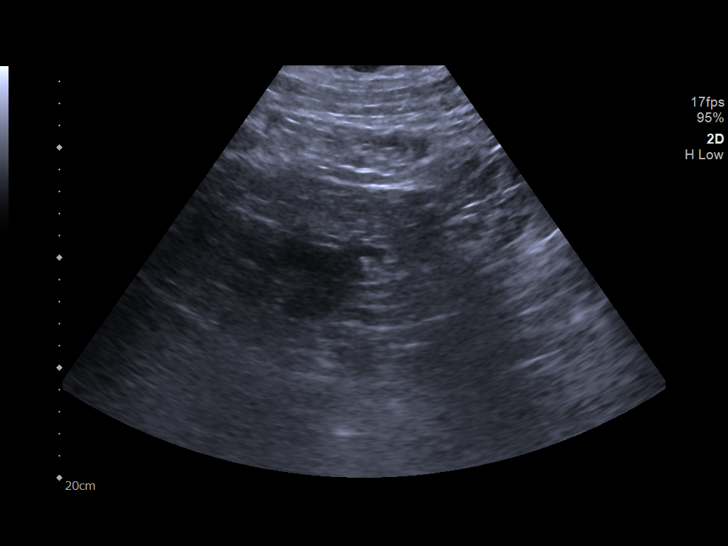
[im 20/30]
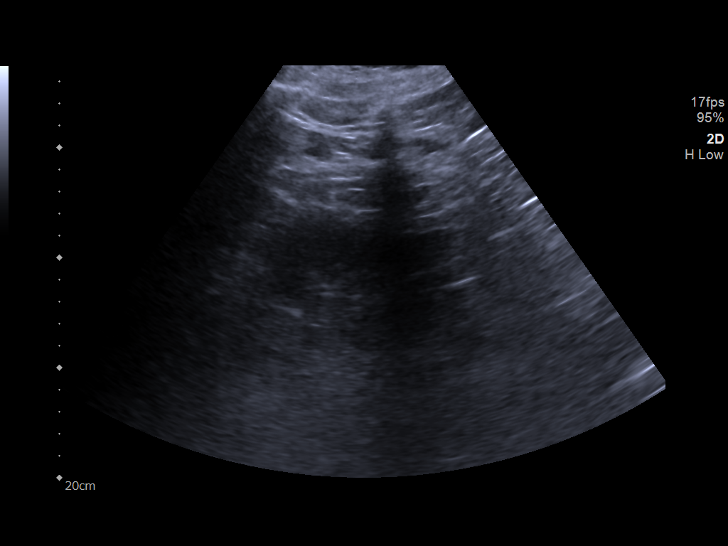
[im 22/30]
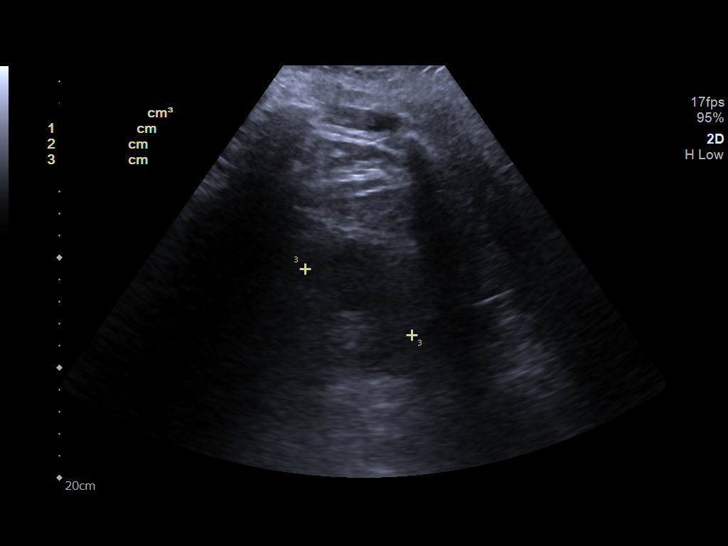
[im 25/30]
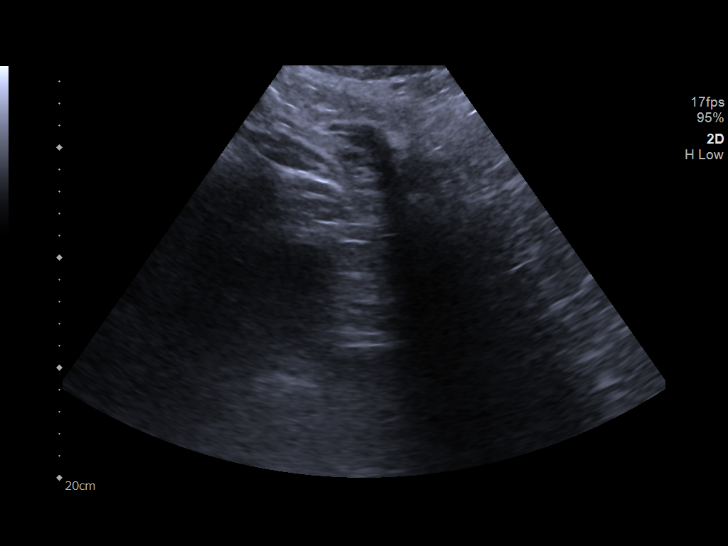
[im 27/30]
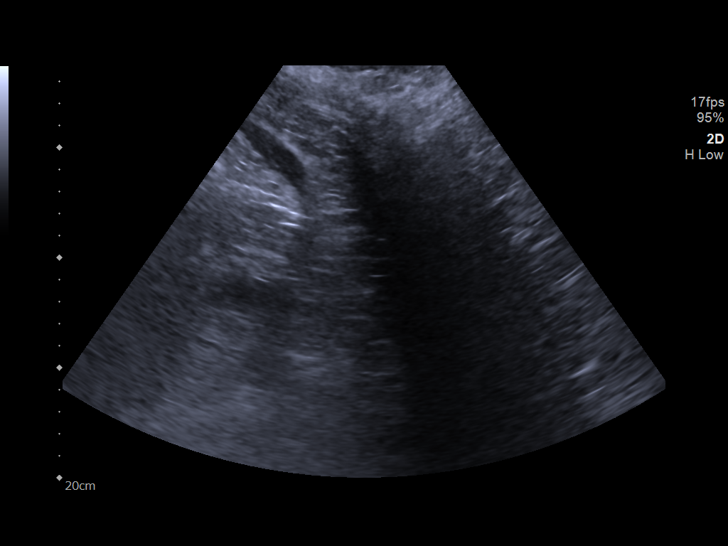
[im 30/30]
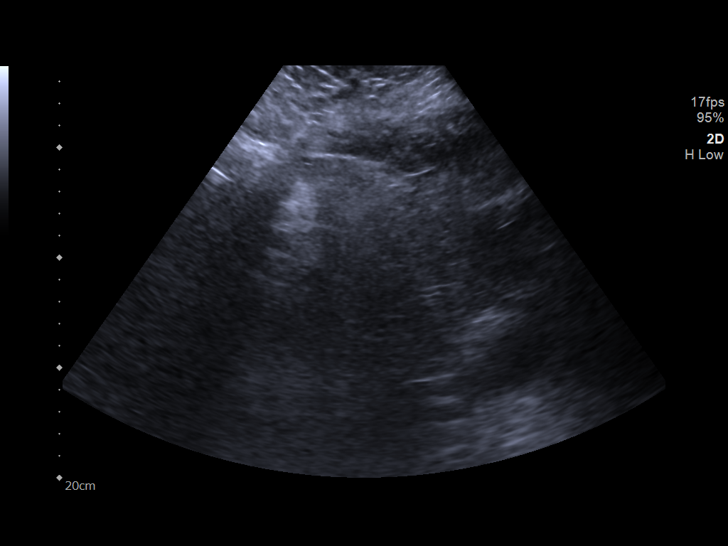

[14 of 25 positions shown; findings below may reference images not displayed]

FINDINGS: Right Kidney:

Renal measurements: 12.5 x 5.2 x 5.6 cm = volume: 198 mL .
Echogenicity within normal limits. No mass or hydronephrosis
visualized.

Left Kidney:

Renal measurements: 11.5 x 6.3 x 5.7 cm = volume: 218 mL.
Echogenicity within normal limits. No mass or hydronephrosis
visualized. Suboptimal visualization due to nearby surgical
changes/gauze.

Bladder:

Appears normal for degree of bladder distention.
IMPRESSION: Normal bilateral renal ultrasound, with mild study limitations
detailed above.

## 2020-02-02 ENCOUNTER — Telehealth: Payer: Self-pay | Admitting: *Deleted

## 2020-02-02 IMAGING — DX DG ABDOMEN 1V
1 series · 1 of 1 positions shown · non-contrast
Comparison: Three days ago

CLINICAL DATA: Encounter for feeding tube placement

EXAM:
ABDOMEN - 1 VIEW

[abdomen]
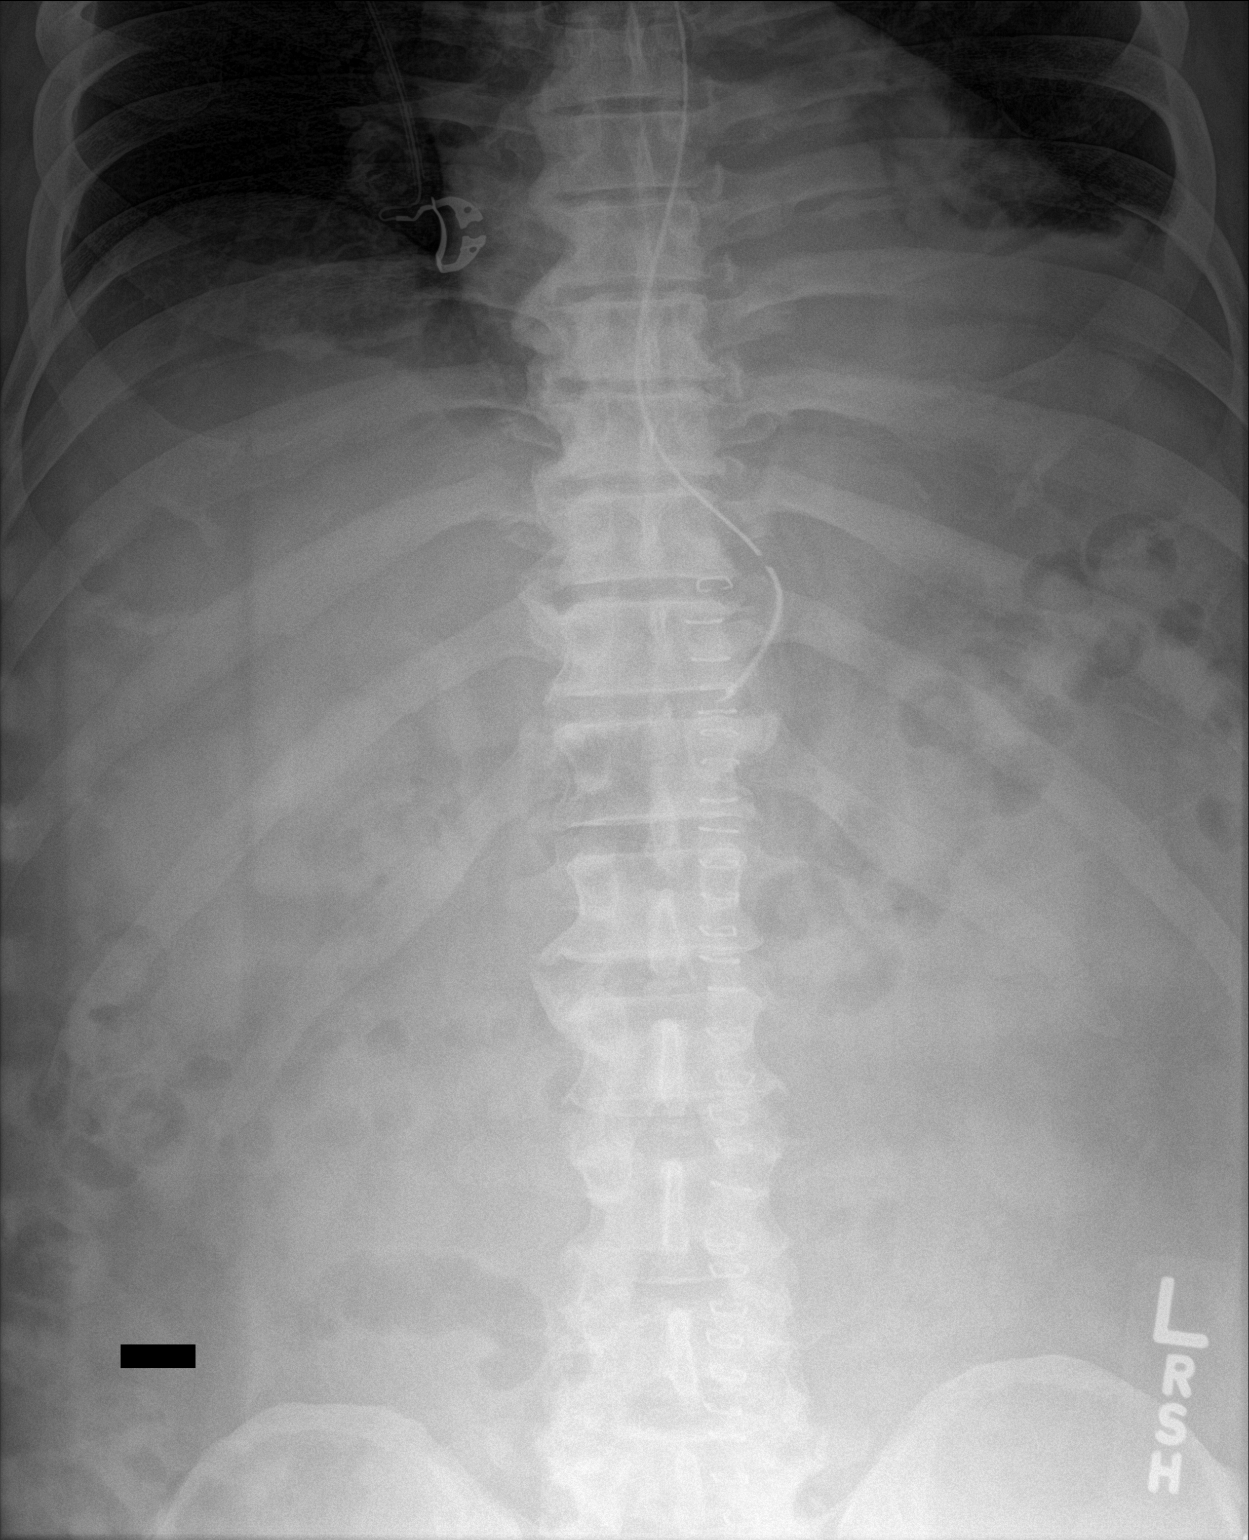

[1 of 1 positions shown; findings below may reference images not displayed]

FINDINGS: A gastric suction tube tip overlaps the stomach. A weighted feeding
tube is not seen. Retrocardiac opacity and air bronchogram.
IMPRESSION: Enteric tube tip over the stomach.

## 2020-04-08 ENCOUNTER — Other Ambulatory Visit: Payer: Self-pay

## 2020-04-08 DIAGNOSIS — I714 Abdominal aortic aneurysm, without rupture, unspecified: Secondary | ICD-10-CM

## 2020-05-10 ENCOUNTER — Ambulatory Visit
Admission: RE | Admit: 2020-05-10 | Discharge: 2020-05-10 | Disposition: A | Discharge status: Home / Self Care | Payer: 59 | Source: Ambulatory Visit | Attending: Vascular Surgery | Admitting: Vascular Surgery

## 2020-05-10 ENCOUNTER — Other Ambulatory Visit: Payer: Self-pay

## 2020-05-10 DIAGNOSIS — I714 Abdominal aortic aneurysm, without rupture, unspecified: Secondary | ICD-10-CM

## 2020-05-10 MED ORDER — IOPAMIDOL (ISOVUE-370) INJECTION 76%
75.0000 mL | Freq: Once | INTRAVENOUS | Status: AC | PRN
  Administered 2020-05-10: 75 mL via INTRAVENOUS

## 2020-05-16 ENCOUNTER — Ambulatory Visit (INDEPENDENT_AMBULATORY_CARE_PROVIDER_SITE_OTHER): Payer: 59 | Admitting: Vascular Surgery

## 2020-05-16 ENCOUNTER — Other Ambulatory Visit: Payer: Self-pay

## 2020-05-16 VITALS — BP 125/82 | HR 79 | Temp 98.3°F | Resp 20 | Ht 76.0 in | Wt 284.0 lb

## 2020-05-16 DIAGNOSIS — I713 Abdominal aortic aneurysm, ruptured, unspecified: Secondary | ICD-10-CM

## 2020-05-16 NOTE — Progress Notes (Signed)
Patient is a 64 year old male who returns for follow-up today.  He underwent repair of a ruptured abdominal aortic aneurysm February 2020.  He was fairly deconditioned at his last office visit in August 2020.  His gut function has returned to normal.  He has no areas of hernia that he can detect.  He states he still has some weakness in his legs but this is overall improved.  He does not really describe claudication.  Review of systems: He has no shortness of breath.  He has no chest pain.  Current Outpatient Medications on File Prior to Visit  Medication Sig Dispense Refill  . amLODipine (NORVASC) 10 MG tablet Take 1 tablet (10 mg total) by mouth daily. 30 tablet 1  . aspirin EC 81 MG tablet Take 81 mg by mouth daily.     . carvedilol (COREG) 25 MG tablet Take 1 tablet (25 mg total) by mouth 2 (two) times daily with a meal. 60 tablet 1  . diclofenac (VOLTAREN) 75 MG EC tablet Take 75 mg by mouth 2 (two) times daily.    [name] Kitchen doxycycline (VIBRA-TABS) 100 MG tablet Take 100 mg by mouth 2 (two) times daily.    . rosuvastatin (CRESTOR) 20 MG tablet Take 20 mg by mouth daily.     No current facility-administered medications on file prior to visit.    Social History   Socioeconomic History  . Marital status: Married    Spouse name: Not on file  . Number of children: Not on file  . Years of education: Not on file  . Highest education level: Not on file  Occupational History  . Not on file  Tobacco Use  . Smoking status: Never Smoker  . Smokeless tobacco: Never Used  Vaping Use  . Vaping Use: Never used  Substance and Sexual Activity  . Alcohol use: Yes  . Drug use: Yes    Types: Marijuana  . Sexual activity: Not on file  Other Topics Concern  . Not on file  Social History Narrative   Works with [employer]   Social Determinants of Health   Financial Resource Strain:   . Difficulty of Paying Living Expenses: Not on file  Food Insecurity:   . Worried About Charity fundraiser  in the Last Year: Not on file  . Ran Out of Food in the Last Year: Not on file  Transportation Needs:   . Lack of Transportation (Medical): Not on file  . Lack of Transportation (Non-Medical): Not on file  Physical Activity:   . Days of Exercise per Week: Not on file  . Minutes of Exercise per Session: Not on file  Stress:   . Feeling of Stress : Not on file  Social Connections:   . Frequency of Communication with Friends and Family: Not on file  . Frequency of Social Gatherings with Friends and Family: Not on file  . Attends Religious Services: Not on file  . Active Member of Clubs or Organizations: Not on file  . Attends Archivist Meetings: Not on file  . Marital Status: Not on file  Intimate Partner Violence:   . Fear of Current or Ex-Partner: Not on file  . Emotionally Abused: Not on file  . Physically Abused: Not on file  . Sexually Abused: Not on file   Physical exam:  Vitals:   05/16/20 1230  BP: 125/82  Pulse: 79  Resp: 20  Temp: 98.3 F (36.8 C)  SpO2: 93%  Weight: 284 lb (  128.8 kg)  Height: 6\' 4"  (1.93 m)    Extremities: 2+ dorsalis pedis posterior tibial pulse  Abdomen: Soft nontender nondistended well-healed midline laparotomy incision no evidence of hernia  Data: He had a CT angiogram of the abdomen and pelvis May 10, 2020.  This shows no evidence of perianastomotic aneurysm and no complicating features of his aortic tube graft repair.  I reviewed and interpreted these images.  Assessment: Doing well status post ruptured AAA repair 1 year ago no evidence of perianastomotic aneurysms on CT  Plan: Follow-up in 5 years with repeat CT angiogram abdomen pelvis  Ruta Hinds, MD [facility] [place] Office: [phone]

## 2021-03-18 ENCOUNTER — Other Ambulatory Visit: Payer: Self-pay | Admitting: Gastroenterology

## 2021-04-07 ENCOUNTER — Other Ambulatory Visit: Payer: Self-pay

## 2021-04-11 ENCOUNTER — Ambulatory Visit (HOSPITAL_COMMUNITY)
Admission: RE | Admit: 2021-04-11 | Discharge: 2021-04-11 | Disposition: A | Discharge status: Home / Self Care | Payer: 59 | Attending: Gastroenterology | Admitting: Gastroenterology

## 2021-04-11 ENCOUNTER — Encounter (HOSPITAL_COMMUNITY)
Admission: RE | Disposition: A | Discharge status: Home / Self Care | Payer: Self-pay | Source: Home / Self Care | Attending: Gastroenterology

## 2021-04-11 ENCOUNTER — Other Ambulatory Visit: Payer: Self-pay

## 2021-04-11 ENCOUNTER — Encounter (HOSPITAL_COMMUNITY): Payer: Self-pay | Admitting: Gastroenterology

## 2021-04-11 ENCOUNTER — Ambulatory Visit (HOSPITAL_COMMUNITY): Payer: 59 | Admitting: Certified Registered Nurse Anesthetist

## 2021-04-11 DIAGNOSIS — Z86010 Personal history of colonic polyps: Secondary | ICD-10-CM | POA: Diagnosis not present

## 2021-04-11 DIAGNOSIS — K635 Polyp of colon: Secondary | ICD-10-CM | POA: Diagnosis not present

## 2021-04-11 DIAGNOSIS — Z1211 Encounter for screening for malignant neoplasm of colon: Secondary | ICD-10-CM | POA: Diagnosis not present

## 2021-04-11 HISTORY — PX: POLYPECTOMY: SHX5525

## 2021-04-11 HISTORY — PX: COLONOSCOPY WITH PROPOFOL: SHX5780

## 2021-04-11 SURGERY — COLONOSCOPY WITH PROPOFOL
Anesthesia: Monitor Anesthesia Care

## 2021-04-11 MED ORDER — PROPOFOL 500 MG/50ML IV EMUL
INTRAVENOUS | Status: DC | PRN
  Administered 2021-04-11 (×3): 30 mg via INTRAVENOUS

## 2021-04-11 MED ORDER — PROPOFOL 500 MG/50ML IV EMUL
INTRAVENOUS | Status: DC | PRN
  Administered 2021-04-11: 125 ug/kg/min via INTRAVENOUS

## 2021-04-11 MED ORDER — LACTATED RINGERS IV SOLN: INTRAVENOUS | Status: DC | PRN

## 2021-04-11 MED ORDER — PROPOFOL 500 MG/50ML IV EMUL: INTRAVENOUS | Status: DC | PRN

## 2021-04-11 MED ORDER — SODIUM CHLORIDE 0.9 % IV SOLN: INTRAVENOUS | Status: DC

## 2021-04-11 MED ORDER — ONDANSETRON HCL 4 MG/2ML IJ SOLN
INTRAMUSCULAR | Status: DC | PRN
Start: 2021-04-11 — End: 2021-04-11
  Administered 2021-04-11: 4 mg via INTRAVENOUS

## 2021-04-11 MED ORDER — PROPOFOL 1000 MG/100ML IV EMUL
INTRAVENOUS | Status: AC
  Filled 2021-04-11: qty 100

## 2021-04-11 SURGICAL SUPPLY — 22 items

## 2021-04-11 NOTE — Anesthesia Preprocedure Evaluation (Addendum)
Anesthesia Evaluation  Patient identified by MRN, date of birth, ID band Patient awake    Reviewed: Allergy & Precautions, NPO status , Patient's Chart, lab work & pertinent test results, reviewed documented beta blocker date and time   History of Anesthesia Complications Negative for: history of anesthetic complications  Airway Mallampati: II  TM Distance: >3 FB Neck ROM: Full    Dental  (+) Teeth Intact, Dental Advisory Given   Pulmonary Current Smoker and Patient abstained from smoking.,    breath sounds clear to auscultation       Cardiovascular hypertension, Pt. on medications and Pt. on home beta blockers (-) angina+ Peripheral Vascular Disease (2020 ruptured AAA repair)   Rhythm:Regular Rate:Normal  2020 ECHO: EF 55-60%. There is moderate LVH, no significant valvular abnormalities    Neuro/Psych negative neurological ROS     GI/Hepatic negative GI ROS, (+)     substance abuse  marijuana use,   Endo/Other  Morbid obesity  Renal/GU Renal InsufficiencyRenal disease     Musculoskeletal   Abdominal (+) + obese,   Peds  Hematology negative hematology ROS (+)   Anesthesia Other Findings   Reproductive/Obstetrics                            Anesthesia Physical Anesthesia Plan  ASA: 3  Anesthesia Plan: MAC   Post-op Pain Management:    Induction:   PONV Risk Score and Plan: 1 and Treatment may vary due to age or medical condition  Airway Management Planned: Natural Airway and Simple Face Mask  Additional Equipment: None  Intra-op Plan:   Post-operative Plan:   Informed Consent: I have reviewed the patients History and Physical, chart, labs and discussed the procedure including the risks, benefits and alternatives for the proposed anesthesia with the patient or authorized representative who has indicated his/her understanding and acceptance.     Dental advisory  given  Plan Discussed with: CRNA and Surgeon  Anesthesia Plan Comments:        Anesthesia Quick Evaluation

## 2021-04-11 NOTE — H&P (Signed)
Robert [name] HPI: At this time the patient denies any problems with nausea, vomiting, fevers, chills, abdominal pain, diarrhea, constipation, hematochezia, melena, GERD, or dysphagia. The patient denies any known family history of colon cancers. No complaints of chest pain, SOB, MI, or sleep apnea.  The patient's colonoscopy on 05/21/2011 was positive for two adenomas and he was recommended a 5 year follow up, at that time.  On 11/05/2018 the patient suffered with an abdominal aortic aneurysmal rupture.  At that time he coded and this rupture occurred when he was waiting in the ER.  The patient was successfully treated with a graft.  He was not expected to survive at all points during this event andsurgery.   Past Medical History:  Diagnosis Date   AAA (abdominal aortic aneurysm, ruptured) ([place])    Fibromuscular dysplasia of renal artery (HCC)    s/p angioplasty to both renal arteries in 2006   History of cardiac catheterization    LHC in 2013:  no sig CAD   Hyperlipidemia    Hypertension     Past Surgical History:  Procedure Laterality Date   ABDOMINAL AORTIC ANEURYSM REPAIR N/A 11/05/2018   Procedure: ANEURYSM ABDOMINAL AORTIC REPAIR WITH HEMASHIELD GOLD VELOUR VASCULAR GRAFT;  Surgeon: Robert Dutch, MD;  Location: [place];  Service: Vascular;  Laterality: N/A;   CARDIAC SURGERY  2006   stent placement   HERNIA REPAIR     INSERTION OF MESH  08/31/2012   Procedure: INSERTION OF MESH;  Surgeon: Robert Burn. Georgette Dover, MD;  Location: [place];  Service: General;  Laterality: N/A;   KNEE SURGERY  2009/2010   right   KNEE SURGERY  2010   right knee partial replacement   LEFT HEART CATHETERIZATION WITH CORONARY ANGIOGRAM N/A 05/19/2012   Procedure: LEFT HEART CATHETERIZATION WITH CORONARY ANGIOGRAM;  Surgeon: Robert Casino, MD;  Location: [facility] CATH LAB;  Service: Cardiovascular;  Laterality: N/A;   UMBILICAL HERNIA REPAIR  08/31/2012   Procedure: HERNIA REPAIR UMBILICAL  ADULT;  Surgeon: Robert Burn. Georgette Dover, MD;  Location: [place];  Service: General;  Laterality: N/A;  umbilical hernia repair with mesh    Family History  Problem Relation Age of Onset   Heart disease Father    Cancer Paternal Uncle        prostate   Cancer Paternal Uncle        prostate    Social History:  reports that he has never smoked. He has never used smokeless tobacco. He reports current alcohol use. He reports current drug use. Drug: Marijuana.  Allergies: No Known Allergies  Medications: Scheduled: Continuous:  sodium chloride      No results found for this or any previous visit (from the past 24 hour(s)).   No results found.  ROS:  As stated above in the HPI otherwise negative.  Blood pressure (!) 170/106, temperature (!) 97.2 F (36.2 C), temperature source Temporal, resp. rate 18, height '6\' 4"'$  (1.93 m), weight 131.5 kg, SpO2 96 %.    PE: Gen: NAD, Alert and Oriented HEENT:  [place]/AT, EOMI Neck: Supple, no LAD Lungs: CTA Bilaterally CV: RRR without M/G/R ABD: Soft, NTND, +BS Ext: No C/C/E  Assessment/Plan: 1) Screening colonoscopy.  Robert [name] D 04/11/2021, 7:21 AM

## 2021-04-11 NOTE — Anesthesia Procedure Notes (Signed)
Procedure Name: MAC Date/Time: 04/11/2021 8:40 AM Performed by: Jari Pigg, CRNA Pre-anesthesia Checklist: Patient identified, Emergency Drugs available, Suction available and Patient being monitored Patient Re-evaluated:Patient Re-evaluated prior to induction Oxygen Delivery Method: Simple face mask

## 2021-04-11 NOTE — Transfer of Care (Signed)
Immediate Anesthesia Transfer of Care Note  Patient: Robert [name]  Procedure(s) Performed: COLONOSCOPY WITH PROPOFOL POLYPECTOMY  Patient Location: PACU and Endoscopy Unit  Anesthesia Type:MAC  Level of Consciousness: drowsy and patient cooperative  Airway & Oxygen Therapy: Patient Spontanous Breathing and Patient connected to face mask oxygen  Post-op Assessment: Report given to RN and Post -op Vital signs reviewed and stable  Post vital signs: Reviewed and stable  Last Vitals:  Vitals Value Taken Time  BP 141/75 04/11/21 0909  Temp    Pulse    Resp 17 04/11/21 0909  SpO2 94 % 04/11/21 0909    Last Pain:  Vitals:   04/11/21 0909  TempSrc:   PainSc: 0-No pain         Complications: No notable events documented.

## 2021-04-11 NOTE — Op Note (Signed)
[facility] Patient Name: Robert [name] Procedure Date: 04/11/2021 MRN: [id number] Attending MD: Carol Ada , MD Date of Birth: Feb 01, 1956 CSN: [id number] Age: 65 Admit Type: Outpatient Procedure:                Colonoscopy Indications:              Screening for colorectal malignant neoplasm Providers:                Carol Ada, MD, Terrall Laity, Kary Kos RN,                            RN, Tyna Jaksch Technician Referring MD:              Medicines:                 Complications:            No immediate complications. Estimated Blood Loss:     Estimated blood loss: none. Procedure:                Pre-Anesthesia Assessment:                           - Prior to the procedure, a History and Physical                            was performed, and patient medications and                            allergies were reviewed. The patient's tolerance of                            previous anesthesia was also reviewed. The risks                            and benefits of the procedure and the sedation                            options and risks were discussed with the patient.                            All questions were answered, and informed consent                            was obtained. Prior Anticoagulants: The patient has                            taken no previous anticoagulant or antiplatelet                            agents. ASA Grade Assessment: III - A patient with                            severe systemic disease. After reviewing the risks  and benefits, the patient was deemed in                            satisfactory condition to undergo the procedure.                           - Sedation was administered by an anesthesia                            professional. Deep sedation was attained.                           After obtaining informed consent, the colonoscope                            was passed under direct  vision. Throughout the                            procedure, the patient's blood pressure, pulse, and                            oxygen saturations were monitored continuously. The                            [id number] Olympus colonoscope was                            introduced through the anus and advanced to the the                            cecum, identified by appendiceal orifice and                            ileocecal valve. The colonoscopy was performed                            without difficulty. The patient tolerated the                            procedure well. The quality of the bowel                            preparation was good. The ileocecal valve,                            appendiceal orifice, and rectum were photographed. Scope In: 8:43:41 AM Scope Out: 9:03:34 AM Scope Withdrawal Time: 0 hours 15 minutes 59 seconds  Total Procedure Duration: 0 hours 19 minutes 53 seconds  Findings:      A 3 mm polyp was found in the descending colon. The polyp was sessile.       The polyp was removed with a cold snare. Resection was complete, but the       polyp tissue was not retrieved. Impression:               -  One 3 mm polyp in the descending colon, removed                            with a cold snare. Complete resection. Polyp tissue                            not retrieved. Moderate Sedation:      Not Applicable - Patient had care per Anesthesia. Recommendation:           - Patient has a contact number available for                            emergencies. The signs and symptoms of potential                            delayed complications were discussed with the                            patient. Return to normal activities tomorrow.                            Written discharge instructions were provided to the                            patient.                           - Resume previous diet.                           - Continue present medications.                            - Await pathology results.                           - Repeat colonoscopy in 7 years for surveillance. Procedure Code(s):        --- Professional ---                           [phone], Colonoscopy, flexible; with removal of                            tumor(s), polyp(s), or other lesion(s) by snare                            technique Diagnosis Code(s):        --- Professional ---                           Z12.11, Encounter for screening for malignant                            neoplasm of colon  K63.5, Polyp of colon CPT copyright 2019 American Medical Association. All rights reserved. The codes documented in this report are preliminary and upon coder review may  be revised to meet current compliance requirements. Carol Ada, MD Carol Ada, MD 04/11/2021 9:06:59 AM This report has been signed electronically. Number of Addenda: 0

## 2021-04-11 NOTE — Anesthesia Postprocedure Evaluation (Signed)
Anesthesia Post Note  Patient: Robert [name]  Procedure(s) Performed: COLONOSCOPY WITH PROPOFOL POLYPECTOMY     Patient location during evaluation: Endoscopy Anesthesia Type: MAC Level of consciousness: awake and alert, patient cooperative and oriented Pain management: pain level controlled Vital Signs Assessment: post-procedure vital signs reviewed and stable Respiratory status: nonlabored ventilation, respiratory function stable and spontaneous breathing Cardiovascular status: blood pressure returned to baseline and stable Postop Assessment: no apparent nausea or vomiting and able to ambulate Anesthetic complications: no   No notable events documented.  Last Vitals:  Vitals:   04/11/21 0909 04/11/21 0920  BP: (!) 141/75 (!) 162/104  Resp: 17 15  Temp: 36.5 C   SpO2: 94% 93%    Last Pain:  Vitals:   04/11/21 0920  TempSrc:   PainSc: 0-No pain                 Dammon Makarewicz,E. Jazarah Capili

## 2021-04-15 ENCOUNTER — Encounter (HOSPITAL_COMMUNITY): Payer: Self-pay | Admitting: Gastroenterology

## 2021-11-18 NOTE — Progress Notes (Signed)
GU Location of Tumor / Histology: Prostate Ca ? ?If Prostate Cancer, Gleason Score is (4 + 3) and PSA is (4.59 as of 07/2021) ? ?Biopsies: ?Dr. Cain Sieve ? ? ? ? ?Past/Anticipated interventions by urology, if any:  ? ? ?Past/Anticipated interventions by medical oncology, if any:  NA ? ?Weight changes, if any: No ? ?IPSS:  5 ?SHIM:  24 ? ?Bowel/Bladder complaints, if any:  No ? ?Nausea/Vomiting, if any:  No ? ?Pain issues, if any:  0/10 ? ?SAFETY ISSUES: ?Prior radiation?   No ?Pacemaker/ICD?  No ?Possible current pregnancy?  Male ?Is the patient on methotrexate? No ? ?Current Complaints / other details:  Need more information on treatment options. ?

## 2021-11-20 DIAGNOSIS — K0510 Chronic gingivitis, plaque induced: Secondary | ICD-10-CM | POA: Insufficient documentation

## 2021-11-20 DIAGNOSIS — K760 Fatty (change of) liver, not elsewhere classified: Secondary | ICD-10-CM | POA: Insufficient documentation

## 2021-11-20 DIAGNOSIS — N529 Male erectile dysfunction, unspecified: Secondary | ICD-10-CM | POA: Insufficient documentation

## 2021-11-20 DIAGNOSIS — E7800 Pure hypercholesterolemia, unspecified: Secondary | ICD-10-CM | POA: Insufficient documentation

## 2021-11-20 DIAGNOSIS — I10 Essential (primary) hypertension: Secondary | ICD-10-CM | POA: Insufficient documentation

## 2021-11-20 DIAGNOSIS — J301 Allergic rhinitis due to pollen: Secondary | ICD-10-CM | POA: Insufficient documentation

## 2021-11-25 ENCOUNTER — Other Ambulatory Visit: Payer: Self-pay

## 2021-11-25 ENCOUNTER — Ambulatory Visit
Admission: RE | Admit: 2021-11-25 | Discharge: 2021-11-25 | Disposition: A | Discharge status: Home / Self Care | Payer: 59 | Source: Ambulatory Visit | Attending: Radiation Oncology | Admitting: Radiation Oncology

## 2021-11-25 VITALS — BP 144/95 | HR 72 | Temp 97.9°F | Resp 20 | Ht 76.0 in | Wt 305.8 lb

## 2021-11-25 DIAGNOSIS — C61 Malignant neoplasm of prostate: Secondary | ICD-10-CM

## 2021-11-25 NOTE — Progress Notes (Signed)
Introduced myself to the patient as the prostate nurse navigator.  No barriers to care identified at this time.  He is here to discuss his radiation treatment options.  Patient does have a surgical consult with Dr. Alinda Money on 12/12/2021; and is agreeable for me to follow up after to ensure treatment decision.  I gave him my business card and asked him to call me with questions or concerns.  Verbalized understanding.  ?

## 2021-11-25 NOTE — Progress Notes (Signed)
Radiation Oncology         [phone] ________________________________  Initial Outpatient Consultation  Name: BRYNDAN BILYK MRN: [id number]  Date: 11/25/2021  DOB: 17-May-1956  [id number], Gwyndolyn Saxon, MD  Vira Agar, MD   REFERRING PHYSICIAN: Vira Agar, MD  DIAGNOSIS: 66 y.o. gentleman with Stage T1c adenocarcinoma of the prostate with Gleason score of 4+3, and PSA of 4.59.    ICD-10-CM   1. Malignant neoplasm of prostate ([place])  C61       HISTORY OF PRESENT ILLNESS: EWIN REHBERG is a 66 y.o. male with a diagnosis of prostate cancer. He was noted to have an elevated PSA of 4.59 by his primary care physician, Dr. Kenton Kingfisher.  Accordingly, he was referred for evaluation in urology by Dr. Cain Sieve on 09/26/21.  The patient proceeded to transrectal ultrasound with 12 biopsies of the prostate on 11/07/21 and DRE performed at the conclusion of the procedure was felt to be normal.  The prostate volume measured 45 cc.  Out of 12 core biopsies, 4 were positive.  The maximum Gleason score was 4+3, and this was seen in the right mid lateral. Additionally Gleason 3+4 was seen in the right mid and left apex lateral (small focus), and Gleason 3+3 in the right base.    The patient reviewed the biopsy results with his urologist and he has kindly been referred today for discussion of potential radiation treatment options.  He is also scheduled for a consult visit with Dr. Alinda Money on 12/12/2021 to further discuss surgical options.   PREVIOUS RADIATION THERAPY: No  PAST MEDICAL HISTORY:  Past Medical History:  Diagnosis Date   AAA (abdominal aortic aneurysm, ruptured) ([place])    Fibromuscular dysplasia of renal artery (HCC)    s/p angioplasty to both renal arteries in 2006   History of cardiac catheterization    LHC in 2013:  no sig CAD   Hyperlipidemia    Hypertension       PAST SURGICAL HISTORY: Past Surgical History:  Procedure Laterality Date   ABDOMINAL AORTIC ANEURYSM  REPAIR N/A 11/05/2018   Procedure: ANEURYSM ABDOMINAL AORTIC REPAIR WITH HEMASHIELD GOLD VELOUR VASCULAR GRAFT;  Surgeon: Elam Dutch, MD;  Location: [place];  Service: Vascular;  Laterality: N/A;   CARDIAC SURGERY  2006   stent placement   COLONOSCOPY WITH PROPOFOL N/A 04/11/2021   Procedure: COLONOSCOPY WITH PROPOFOL;  Surgeon: Carol Ada, MD;  Location: [facility];  Service: Endoscopy;  Laterality: N/A;   HERNIA REPAIR     INSERTION OF MESH  08/31/2012   Procedure: INSERTION OF MESH;  Surgeon: Imogene Burn. Georgette Dover, MD;  Location: [place];  Service: General;  Laterality: N/A;   KNEE SURGERY  2009/2010   right   KNEE SURGERY  2010   right knee partial replacement   LEFT HEART CATHETERIZATION WITH CORONARY ANGIOGRAM N/A 05/19/2012   Procedure: LEFT HEART CATHETERIZATION WITH CORONARY ANGIOGRAM;  Surgeon: Pixie Casino, MD;  [facility];  Service: Cardiovascular;  Laterality: N/A;   POLYPECTOMY  04/11/2021   Procedure: POLYPECTOMY;  Surgeon: Carol Ada, MD;  Location: [facility];  Service: Endoscopy;;   UMBILICAL HERNIA REPAIR  08/31/2012   Procedure: HERNIA REPAIR UMBILICAL ADULT;  Surgeon: Imogene Burn. Georgette Dover, MD;  Location: [place];  Service: General;  Laterality: N/A;  umbilical hernia repair with mesh    FAMILY HISTORY:  Family History  Problem Relation Age of Onset   Heart disease Father  Cancer Paternal Uncle        prostate   Cancer Paternal Uncle        prostate    SOCIAL HISTORY:  Social History   Socioeconomic History   Marital status: Married    Spouse name: Not on file   Number of children: Not on file   Years of education: Not on file   Highest education level: Not on file  Occupational History   Not on file  Tobacco Use   Smoking status: Never   Smokeless tobacco: Never  Vaping Use   Vaping Use: Never used  Substance and Sexual Activity   Alcohol use: Yes   Drug use: Yes    Types: Marijuana   Sexual  activity: Not on file  Other Topics Concern   Not on file  Social History Narrative   Works with Engineer, [employer] (consulting)   Social Determinants of Health   Financial Resource Strain: Not on file  Food Insecurity: Not on file  Transportation Needs: Not on file  Physical Activity: Not on file  Stress: Not on file  Social Connections: Not on file  Intimate Partner Violence: Not on file    ALLERGIES: Benazepril hcl and Nifedipine er  MEDICATIONS:  Current Outpatient Medications  Medication Sig Dispense Refill   amLODipine (NORVASC) 10 MG tablet Take 1 tablet (10 mg total) by mouth daily. 30 tablet 1   APPLE CIDER VINEGAR PO Take 2 each by mouth daily. [place]     carvedilol (COREG) 25 MG tablet Take 1 tablet (25 mg total) by mouth 2 (two) times daily with a meal. 60 tablet 1   diclofenac (VOLTAREN) 75 MG EC tablet Take 75 mg by mouth 2 (two) times daily.     fluticasone (FLONASE) 50 MCG/ACT nasal spray Place 1 spray into both nostrils daily as needed for allergies.     rosuvastatin (CRESTOR) 20 MG tablet Take 20 mg by mouth daily.     sildenafil (VIAGRA) 100 MG tablet TAKE ONE TABLET BY MOUTH DAILY AS NEEDED FOR ERECTILE DYSFUNCTION (ALTERNATE WITH TADALAFIL)     tadalafil (CIALIS) 20 MG tablet TAKE ONE TABLET BY MOUTH DAILY AS NEEDED FOR ERECTILE DYSFUNCTION ALTERNATING WITH SILDENAFIL     No current facility-administered medications for this encounter.    REVIEW OF SYSTEMS:  On review of systems, the patient reports that he is doing well overall. He denies any chest pain, shortness of breath, cough, fevers, chills, night sweats, unintended weight changes. He denies any bowel disturbances, and denies abdominal pain, nausea or vomiting. He denies any new musculoskeletal or joint aches or pains. His IPSS was 5, indicating mild urinary symptoms. His SHIM was 24, indicating he does not have erectile dysfunction. A complete review of systems is obtained and is otherwise negative.    PHYSICAL EXAM:   Wt Readings from Last 3 Encounters:  11/25/21 (!) 305 lb 12.8 oz (138.7 kg)  04/11/21 290 lb (131.5 kg)  05/16/20 284 lb (128.8 kg)   Temp Readings from Last 3 Encounters:  11/25/21 97.9 F (36.6 C)  04/11/21 97.7 F (36.5 C) (Oral)  05/16/20 98.3 F (36.8 C)   BP Readings from Last 3 Encounters:  11/25/21 (!) 144/95  04/11/21 (!) 173/102  05/16/20 125/82   Pulse Readings from Last 3 Encounters:  11/25/21 72  05/16/20 79  02/27/19 83   Pain Assessment Pain Score: 0-No pain/10  In general this is a well appearing African-American male in no acute distress. He's alert and oriented  x4 and appropriate throughout the examination. Cardiopulmonary assessment is negative for acute distress, and he exhibits normal effort.     KPS = 100  100 - Normal; no complaints; no evidence of disease. 90   - Able to carry on normal activity; minor signs or symptoms of disease. 80   - Normal activity with effort; some signs or symptoms of disease. 34   - Cares for self; unable to carry on normal activity or to do active work. 60   - Requires occasional assistance, but is able to care for most of his personal needs. 50   - Requires considerable assistance and frequent medical care. 83   - Disabled; requires special care and assistance. 11   - Severely disabled; hospital admission is indicated although death not imminent. 53   - Very sick; hospital admission necessary; active supportive treatment necessary. 10   - Moribund; fatal processes progressing rapidly. 0     - Dead  Karnofsky DA, Abelmann [facility], Craver LS and Burchenal [facility] [phone]) The use of the nitrogen mustards in the palliative treatment of carcinoma: with particular reference to bronchogenic carcinoma Cancer 1 634-56  LABORATORY DATA:  Lab Results  Component Value Date   WBC 14.0 (H) 11/14/2018   HGB 13.2 11/14/2018   HCT 41.0 11/14/2018   MCV 90.1 11/14/2018   PLT 239 11/14/2018   Lab Results  Component Value Date   NA 135  11/14/2018   K 3.6 11/14/2018   CL 103 11/14/2018   CO2 22 11/14/2018   Lab Results  Component Value Date   ALT 21 11/09/2018   AST 50 (H) 11/09/2018   ALKPHOS 54 11/09/2018   BILITOT 2.0 (H) 11/09/2018     RADIOGRAPHY: No results found.    IMPRESSION/PLAN: 1. 66 y.o. gentleman with Stage T1c adenocarcinoma of the prostate with Gleason Score of 4+3, and PSA of 4.59. We discussed the patient's workup and outlined the nature of prostate cancer in this setting. The patient's T stage, Gleason's score, and PSA put him into the unfavorable intermediate risk group. Accordingly, he is eligible for a variety of potential treatment options including brachytherapy, 5.5 weeks of external radiation, or prostatectomy. We discussed the available radiation techniques, and focused on the details and logistics of delivery. We discussed and outlined the risks, benefits, short and long-term effects associated with radiotherapy and compared and contrasted these with prostatectomy. We discussed the role of SpaceOAR gel in reducing the rectal toxicity associated with radiotherapy. He appears to have a good understanding of his disease and our treatment recommendations which are of curative intent. He was encouraged to ask questions that were answered to his stated satisfaction.  At the conclusion of our conversation, the patient remains undecided regarding his final treatment preference but appears to be leaning towards brachytherapy with SpaceOAR gel placement, as sexual function remains a high priority for him regarding quality of life. He will proceed with the scheduled consult visit with Dr. Alinda Money on 12/12/21 to learn more about the surgical options and intends to reach a decision shortly thereafter.  We will share our discussion with Dr. Cain Sieve.  The patient has our contact information and will let us know if he ultimately decides to proceed with radiotherapy so that we can move forward with treatment planning  accordingly at that time.  We enjoyed meeting him today and look forward to continuing to participate in his care.  Prostate seed implant as monotherapy may be an appropriate option in this selected case  given low volume 4+3 disease.  We personally spent 70 minutes in this encounter including chart review, reviewing radiological studies, meeting face-to-face with the patient, entering orders and completing documentation.    Nicholos Johns, PA-C    Tyler Pita, MD  [facility] Direct Dial: [phone]   Fax: [phone] [email or web address]   Skype   LinkedIn   This document serves as a record of services personally performed by Tyler Pita, MD and Freeman Caldron, PA-C. It was created on their behalf by Wilburn Mylar, a trained medical scribe. The creation of this record is based on the scribe's personal observations and the provider's statements to them. This document has been checked and approved by the attending provider.

## 2021-12-15 NOTE — Progress Notes (Signed)
Patient was consult on 3/7 for  Stage T1c adenocarcinoma of the prostate with Gleason Score of 4+3, and PSA of 4.59.   Had surgical consult with Dr. Alinda Money on 3/24 at [facility].  ? ?RN followed up with patient today, he is leaning towards 5.5 weeks of radiation but would like to take a little more time to review and finalize his decision.   ? ?Will plan on following up week of 4/10 per patient request.  ?

## 2021-12-30 ENCOUNTER — Telehealth: Payer: Self-pay | Admitting: *Deleted

## 2021-12-30 ENCOUNTER — Other Ambulatory Visit: Payer: Self-pay | Admitting: Urology

## 2021-12-30 ENCOUNTER — Encounter (HOSPITAL_BASED_OUTPATIENT_CLINIC_OR_DEPARTMENT_OTHER): Payer: Self-pay | Admitting: Urology

## 2021-12-30 NOTE — Telephone Encounter (Signed)
CALLED PATIENT TO INFORM OF FID. MARKER AND SPACE OAR PLACEMENT ON 01-21-22 AND HIS SIM ON 01-23-22- ARRIVAL TIME- 9:45 AM @ [place], SPOKE WITH PATIENT AND HE IS AWARE OF THESE APPTS. ?

## 2022-01-16 ENCOUNTER — Encounter: Payer: Self-pay | Admitting: *Deleted

## 2022-01-19 ENCOUNTER — Telehealth: Payer: Self-pay | Admitting: *Deleted

## 2022-01-19 NOTE — Telephone Encounter (Signed)
RETURNED PATIENT'S PHONE CALL, SPOKE WITH PATIENT. ?

## 2022-01-20 ENCOUNTER — Other Ambulatory Visit: Payer: Self-pay

## 2022-01-20 ENCOUNTER — Encounter (HOSPITAL_BASED_OUTPATIENT_CLINIC_OR_DEPARTMENT_OTHER): Payer: Self-pay | Admitting: Urology

## 2022-01-21 ENCOUNTER — Encounter (HOSPITAL_BASED_OUTPATIENT_CLINIC_OR_DEPARTMENT_OTHER)
Admission: RE | Disposition: A | Discharge status: Home / Self Care | Payer: Self-pay | Source: Home / Self Care | Attending: Urology

## 2022-01-21 ENCOUNTER — Ambulatory Visit (HOSPITAL_BASED_OUTPATIENT_CLINIC_OR_DEPARTMENT_OTHER): Payer: 59 | Admitting: Anesthesiology

## 2022-01-21 ENCOUNTER — Encounter (HOSPITAL_BASED_OUTPATIENT_CLINIC_OR_DEPARTMENT_OTHER): Payer: Self-pay | Admitting: Urology

## 2022-01-21 ENCOUNTER — Ambulatory Visit (HOSPITAL_BASED_OUTPATIENT_CLINIC_OR_DEPARTMENT_OTHER)
Admission: RE | Admit: 2022-01-21 | Discharge: 2022-01-21 | Disposition: A | Discharge status: Home / Self Care | Payer: 59 | Attending: Urology | Admitting: Urology

## 2022-01-21 DIAGNOSIS — I10 Essential (primary) hypertension: Secondary | ICD-10-CM

## 2022-01-21 DIAGNOSIS — Z79899 Other long term (current) drug therapy: Secondary | ICD-10-CM | POA: Diagnosis not present

## 2022-01-21 DIAGNOSIS — Z6837 Body mass index (BMI) 37.0-37.9, adult: Secondary | ICD-10-CM | POA: Diagnosis not present

## 2022-01-21 DIAGNOSIS — C61 Malignant neoplasm of prostate: Secondary | ICD-10-CM

## 2022-01-21 DIAGNOSIS — I739 Peripheral vascular disease, unspecified: Secondary | ICD-10-CM | POA: Insufficient documentation

## 2022-01-21 HISTORY — DX: Personal history of other diseases of the circulatory system: Z86.79

## 2022-01-21 HISTORY — PX: SPACE OAR INSTILLATION: SHX6769

## 2022-01-21 HISTORY — DX: Peripheral vascular disease, unspecified: I73.9

## 2022-01-21 HISTORY — PX: GOLD SEED IMPLANT: SHX6343

## 2022-01-21 LAB — POCT I-STAT, CHEM 8
BUN: 14 mg/dL (ref 8–23)
Calcium, Ion: 1.16 mmol/L (ref 1.15–1.40)
Chloride: 106 mmol/L (ref 98–111)
Creatinine, Ser: 0.9 mg/dL (ref 0.61–1.24)
Glucose, Bld: 113 mg/dL — ABNORMAL HIGH (ref 70–99)
HCT: 48 % (ref 39.0–52.0)
Hemoglobin: 16.3 g/dL (ref 13.0–17.0)
Potassium: 3.9 mmol/L (ref 3.5–5.1)
Sodium: 142 mmol/L (ref 135–145)
TCO2: 25 mmol/L (ref 22–32)

## 2022-01-21 SURGERY — INSERTION, GOLD SEEDS
Anesthesia: Monitor Anesthesia Care | Site: Prostate

## 2022-01-21 MED ORDER — PROPOFOL 10 MG/ML IV BOLUS
INTRAVENOUS | Status: AC
  Filled 2022-01-21: qty 20

## 2022-01-21 MED ORDER — GLYCOPYRROLATE 0.2 MG/ML IJ SOLN
INTRAMUSCULAR | Status: DC | PRN
  Administered 2022-01-21: .2 mg via INTRAVENOUS

## 2022-01-21 MED ORDER — FENTANYL CITRATE (PF) 100 MCG/2ML IJ SOLN
INTRAMUSCULAR | Status: AC
  Filled 2022-01-21: qty 2

## 2022-01-21 MED ORDER — GLYCOPYRROLATE PF 0.2 MG/ML IJ SOSY
PREFILLED_SYRINGE | INTRAMUSCULAR | Status: AC
  Filled 2022-01-21: qty 1

## 2022-01-21 MED ORDER — ONDANSETRON HCL 4 MG/2ML IJ SOLN: 4.0000 mg | Freq: Once | INTRAMUSCULAR | Status: DC | PRN

## 2022-01-21 MED ORDER — FENTANYL CITRATE (PF) 100 MCG/2ML IJ SOLN: 25.0000 ug | INTRAMUSCULAR | Status: DC | PRN

## 2022-01-21 MED ORDER — FLEET ENEMA 7-19 GM/118ML RE ENEM: 1.0000 | ENEMA | Freq: Once | RECTAL | Status: DC

## 2022-01-21 MED ORDER — ONDANSETRON HCL 4 MG/2ML IJ SOLN
INTRAMUSCULAR | Status: DC | PRN
  Administered 2022-01-21: 4 mg via INTRAVENOUS

## 2022-01-21 MED ORDER — HYDRALAZINE HCL 20 MG/ML IJ SOLN
INTRAMUSCULAR | Status: AC
  Filled 2022-01-21: qty 1

## 2022-01-21 MED ORDER — PROPOFOL 500 MG/50ML IV EMUL
INTRAVENOUS | Status: DC | PRN
Start: 2022-01-21 — End: 2022-01-21
  Administered 2022-01-21: 200 ug/kg/min via INTRAVENOUS

## 2022-01-21 MED ORDER — LACTATED RINGERS IV SOLN: INTRAVENOUS | Status: DC

## 2022-01-21 MED ORDER — PHENYLEPHRINE 80 MCG/ML (10ML) SYRINGE FOR IV PUSH (FOR BLOOD PRESSURE SUPPORT)
PREFILLED_SYRINGE | INTRAVENOUS | Status: AC
  Filled 2022-01-21: qty 10

## 2022-01-21 MED ORDER — LIDOCAINE HCL (CARDIAC) PF 100 MG/5ML IV SOSY
PREFILLED_SYRINGE | INTRAVENOUS | Status: DC | PRN
  Administered 2022-01-21: 40 mg via INTRAVENOUS

## 2022-01-21 MED ORDER — ACETAMINOPHEN 10 MG/ML IV SOLN: 1000.0000 mg | Freq: Once | INTRAVENOUS | Status: DC | PRN

## 2022-01-21 MED ORDER — DEXMEDETOMIDINE (PRECEDEX) IN NS 20 MCG/5ML (4 MCG/ML) IV SYRINGE
PREFILLED_SYRINGE | INTRAVENOUS | Status: DC | PRN
  Administered 2022-01-21 (×5): 4 ug via INTRAVENOUS

## 2022-01-21 MED ORDER — ONDANSETRON HCL 4 MG/2ML IJ SOLN
INTRAMUSCULAR | Status: AC
  Filled 2022-01-21: qty 2

## 2022-01-21 MED ORDER — CEFAZOLIN SODIUM-DEXTROSE 2-4 GM/100ML-% IV SOLN
2.0000 g | INTRAVENOUS | Status: AC
  Administered 2022-01-21: 2 g via INTRAVENOUS

## 2022-01-21 MED ORDER — MIDAZOLAM HCL 2 MG/2ML IJ SOLN
INTRAMUSCULAR | Status: AC
  Filled 2022-01-21: qty 2

## 2022-01-21 MED ORDER — LIDOCAINE HCL (PF) 2 % IJ SOLN
INTRAMUSCULAR | Status: AC
  Filled 2022-01-21: qty 5

## 2022-01-21 MED ORDER — FENTANYL CITRATE (PF) 100 MCG/2ML IJ SOLN
INTRAMUSCULAR | Status: DC | PRN
  Administered 2022-01-21: 25 ug via INTRAVENOUS

## 2022-01-21 MED ORDER — HYDRALAZINE HCL 20 MG/ML IJ SOLN
10.0000 mg | Freq: Once | INTRAMUSCULAR | Status: AC
Start: 2022-01-21 — End: 2022-01-21
  Administered 2022-01-21: 10 mg via INTRAVENOUS

## 2022-01-21 MED ORDER — PHENYLEPHRINE HCL (PRESSORS) 10 MG/ML IV SOLN
INTRAVENOUS | Status: DC | PRN
  Administered 2022-01-21 (×2): 80 ug via INTRAVENOUS

## 2022-01-21 MED ORDER — PROPOFOL 1000 MG/100ML IV EMUL
INTRAVENOUS | Status: AC
  Filled 2022-01-21: qty 100

## 2022-01-21 MED ORDER — SODIUM CHLORIDE (PF) 0.9 % IJ SOLN
INTRAMUSCULAR | Status: DC | PRN
  Administered 2022-01-21 (×2): 10 mL via INTRAVENOUS

## 2022-01-21 MED ORDER — PROPOFOL 10 MG/ML IV BOLUS
INTRAVENOUS | Status: DC | PRN
Start: 2022-01-21 — End: 2022-01-21
  Administered 2022-01-21: 10 mg via INTRAVENOUS
  Administered 2022-01-21: 20 mg via INTRAVENOUS
  Administered 2022-01-21 (×2): 10 mg via INTRAVENOUS

## 2022-01-21 MED ORDER — CEFAZOLIN SODIUM-DEXTROSE 2-4 GM/100ML-% IV SOLN
INTRAVENOUS | Status: AC
  Filled 2022-01-21: qty 100

## 2022-01-21 MED ORDER — MIDAZOLAM HCL 5 MG/5ML IJ SOLN
INTRAMUSCULAR | Status: DC | PRN
Start: 2022-01-21 — End: 2022-01-21
  Administered 2022-01-21: 2 mg via INTRAVENOUS

## 2022-01-21 MED ORDER — PROPOFOL 500 MG/50ML IV EMUL
INTRAVENOUS | Status: AC
  Filled 2022-01-21: qty 50

## 2022-01-21 SURGICAL SUPPLY — 25 items
BLADE CLIPPER SENSICLIP SURGIC (BLADE) ×3 IMPLANT
CNTNR URN SCR LID CUP LEK RST (MISCELLANEOUS) ×2 IMPLANT
CONT SPEC 4OZ STRL OR WHT (MISCELLANEOUS) ×3
COVER BACK TABLE 60X90IN (DRAPES) ×3 IMPLANT
DRSG TEGADERM 4X4.75 (GAUZE/BANDAGES/DRESSINGS) ×3 IMPLANT
DRSG TEGADERM 8X12 (GAUZE/BANDAGES/DRESSINGS) ×4 IMPLANT
GAUZE SPONGE 4X4 12PLY STRL (GAUZE/BANDAGES/DRESSINGS) ×3 IMPLANT
GAUZE SPONGE 4X4 12PLY STRL LF (GAUZE/BANDAGES/DRESSINGS) ×1 IMPLANT
GLOVE BIO SURGEON STRL SZ7.5 (GLOVE) ×3 IMPLANT
GLOVE ECLIPSE 8.0 STRL XLNG CF (GLOVE) ×3 IMPLANT
IMPL SPACEOAR VUE SYSTEM (Spacer) IMPLANT
IMPLANT SPACEOAR VUE SYSTEM (Spacer) ×3 IMPLANT
KIT TURNOVER CYSTO (KITS) ×3 IMPLANT
MARKER GOLD PRELOAD 1.2X3 (Urological Implant) ×2 IMPLANT
MARKER SKIN DUAL TIP RULER LAB (MISCELLANEOUS) ×3 IMPLANT
NDL SPNL 22GX3.5 QUINCKE BK (NEEDLE) ×2 IMPLANT
NEEDLE SPNL 22GX3.5 QUINCKE BK (NEEDLE) ×3 IMPLANT
SEED GOLD PRELOAD 1.2X3 (Urological Implant) ×3 IMPLANT
SHEATH ULTRASOUND LF (SHEATH) IMPLANT
SHEATH ULTRASOUND LTX NONSTRL (SHEATH) ×1 IMPLANT
SURGILUBE 2OZ TUBE FLIPTOP (MISCELLANEOUS) ×3 IMPLANT
SYR 10ML LL (SYRINGE) ×1 IMPLANT
SYR CONTROL 10ML LL (SYRINGE) ×3 IMPLANT
TOWEL OR 17X26 10 PK STRL BLUE (TOWEL DISPOSABLE) ×3 IMPLANT
UNDERPAD 30X36 HEAVY ABSORB (UNDERPADS AND DIAPERS) ×3 IMPLANT

## 2022-01-21 NOTE — H&P (Signed)
H&P ? ?History of Present Illness: Robert [name] is a 66 y.o. year old recently diagnosed with prostate cancer who presents today for gold seed + spaceOAR insertion ? ?No acute complaints. ? ?Past Medical History:  ?Diagnosis Date  ? AAA (abdominal aortic aneurysm, ruptured) ([place])   ? Fibromuscular dysplasia of renal artery (HCC)   ? s/p angioplasty to both renal arteries in 2006  ? History of abdominal aortic aneurysm   ? History of cardiac catheterization   ? LHC in 2013:  no sig CAD  ? Hyperlipidemia   ? Hypertension   ? Peripheral vascular disease ([place])   ? ? ?Past Surgical History:  ?Procedure Laterality Date  ? ABDOMINAL AORTIC ANEURYSM REPAIR N/A 11/05/2018  ? Procedure: ANEURYSM ABDOMINAL AORTIC REPAIR WITH HEMASHIELD GOLD VELOUR VASCULAR GRAFT;  Surgeon: Elam Dutch, MD;  Location: [facility] OR;  Service: Vascular;  Laterality: N/A;  ? CARDIAC SURGERY  2006  ? stent placement  ? COLONOSCOPY WITH PROPOFOL N/A 04/11/2021  ? Procedure: COLONOSCOPY WITH PROPOFOL;  Surgeon: Carol Ada, MD;  [facility];  Service: Endoscopy;  Laterality: N/A;  ? HERNIA REPAIR    ? INSERTION OF MESH  08/31/2012  ? Procedure: INSERTION OF MESH;  Surgeon: Imogene Burn. Georgette Dover, MD;  Location: [place];  Service: General;  Laterality: N/A;  ? KNEE SURGERY  2009/2010  ? right  ? KNEE SURGERY  2010  ? right knee partial replacement  ? LEFT HEART CATHETERIZATION WITH CORONARY ANGIOGRAM N/A 05/19/2012  ? Procedure: LEFT HEART CATHETERIZATION WITH CORONARY ANGIOGRAM;  Surgeon: Pixie Casino, MD;  Location[facility] CATH LAB;  Service: Cardiovascular;  Laterality: N/A;  ? POLYPECTOMY  04/11/2021  ? Procedure: POLYPECTOMY;  Surgeon: Carol Ada, MD;  [facility];  Service: Endoscopy;;  ? UMBILICAL HERNIA REPAIR  08/31/2012  ? Procedure: HERNIA REPAIR UMBILICAL ADULT;  Surgeon: Imogene Burn. Georgette Dover, MD;  Location: [place];  Service: General;  Laterality: N/A;  umbilical hernia repair with  mesh  ? ? ?Home Medications:  ?Current Meds  ?Medication Sig  ? amLODipine (NORVASC) 10 MG tablet Take 1 tablet (10 mg total) by mouth daily.  ? carvedilol (COREG) 25 MG tablet Take 1 tablet (25 mg total) by mouth 2 (two) times daily with a meal.  ? diclofenac (VOLTAREN) 75 MG EC tablet Take 75 mg by mouth 2 (two) times daily.  ? rosuvastatin (CRESTOR) 20 MG tablet Take 20 mg by mouth daily.  ? ? ?Allergies:  ?Allergies  ?Allergen Reactions  ? Benazepril Hcl   ?  Other reaction(s): gum irritation  ? Nifedipine Er   ?  Other reaction(s): gum pain  ? ? ?Family History  ?Problem Relation Age of Onset  ? Heart disease Father   ? Cancer Paternal Uncle   ?     prostate  ? Cancer Paternal Uncle   ?     prostate  ? ? ?Social History:  reports that he has never smoked. He has never used smokeless tobacco. He reports current alcohol use. He reports current drug use. Drug: Marijuana. ? ?ROS: ?A complete review of systems was performed.  All systems are negative except for pertinent findings as noted. ? ?Physical Exam:  ?Vital signs in last 24 hours: ?Temp:  [98.2 ?F (36.8 ?C)] 98.2 ?F (36.8 ?C) (05/03 5465) ?Pulse Rate:  [73] 73 (05/03 0739) ?Resp:  [18] 18 (05/03 0739) ?BP: (150)/(99) 150/99 (05/03 0739) ?SpO2:  [99 %] 99 % (05/03 0739) ?Weight:  [  138.7 kg-138.8 kg] 138.8 kg (05/03 0739) ?Constitutional:  Alert and oriented, No acute distress ?Cardiovascular: Regular rate and rhythm, No JVD ?Respiratory: Normal respiratory effort, Lungs clear bilaterally ?GI: Abdomen is soft, nontender, nondistended, no abdominal masses ?GU: No CVA tenderness ?Lymphatic: No lymphadenopathy ?Neurologic: Grossly intact, no focal deficits ?Psychiatric: Normal mood and affect ?Drains/Tubes: none ? ? ?Laboratory Data:  ?Recent Labs  ?  01/21/22 ?8127  ?HGB 16.3  ?HCT 48.0  ? ? ?Recent Labs  ?  01/21/22 ?0741  ?NA 142  ?K 3.9  ?CL 106  ?GLUCOSE 113*  ?BUN 14  ?CREATININE 0.90  ? ? ? ?Results for orders placed or performed during the hospital  encounter of 01/21/22 (from the past 24 hour(s))  ?I-STAT, chem 8     Status: Abnormal  ? Collection Time: 01/21/22  7:41 AM  ?Result Value Ref Range  ? Sodium 142 135 - 145 mmol/L  ? Potassium 3.9 3.5 - 5.1 mmol/L  ? Chloride 106 98 - 111 mmol/L  ? BUN 14 8 - 23 mg/dL  ? Creatinine, Ser 0.90 0.61 - 1.24 mg/dL  ? Glucose, Bld 113 (H) 70 - 99 mg/dL  ? Calcium, Ion 1.16 1.15 - 1.40 mmol/L  ? TCO2 25 22 - 32 mmol/L  ? Hemoglobin 16.3 13.0 - 17.0 g/dL  ? HCT 48.0 39.0 - 52.0 %  ? ?No results found for this or any previous visit (from the past 240 hour(s)). ? ?Renal Function: ?Recent Labs  ?  01/21/22 ?0741  ?CREATININE 0.90  ? ?Estimated Creatinine Clearance: 126.2 mL/min (by C-G formula based on SCr of 0.9 mg/dL). ? ?Radiologic Imaging: ?No results found. ? ?Assessment:  ?66 yo M with prostate cancer, planning XRT tx ? ?Plan:  ?To OR as planned ? ?Donald Pore, MD ?01/21/2022, 8:29 AM  ?[facility] ?Pager: [phone] ? ? ?

## 2022-01-21 NOTE — Transfer of Care (Signed)
Immediate Anesthesia Transfer of Care Note ? ?Patient: Robert [name] ? ?Procedure(s) Performed: Procedure(s) (LRB): ?GOLD SEED IMPLANT (N/A) ?SPACE OAR INSTILLATION (N/A) ? ?Patient Location: PACU ? ?Anesthesia Type: MAC ? ?Level of Consciousness: awake, alert  and oriented ? ?Airway & Oxygen Therapy: Patient Spontanous Breathing and Patient connected to face mask oxygen, nasal trumpet remaining. ? ?Post-op Assessment: Report given to PACU RN and Post -op Vital signs reviewed and stable ? ?Post vital signs: Reviewed and stable ? ?Complications: No apparent anesthesia complications ? ?Last Vitals:  ?Vitals Value Taken Time  ?BP    ?Temp    ?Pulse    ?Resp 19 01/21/22 0948  ?SpO2    ?Vitals shown include unvalidated device data. ? ?Last Pain:  ?Vitals:  ? 01/21/22 0739  ?TempSrc: Oral  ?PainSc: 0-No pain  ?   ? ?Patients Stated Pain Goal: 3 (01/21/22 0739) ? ?Complications: No notable events documented. ?

## 2022-01-21 NOTE — Anesthesia Preprocedure Evaluation (Signed)
Anesthesia Evaluation  ?Patient identified by MRN, date of birth, ID band ?Patient awake ? ? ? ?Reviewed: ?Allergy & Precautions, NPO status , Patient's Chart, lab work & pertinent test results ? ?Airway ?Mallampati: II ? ?TM Distance: >3 FB ?Neck ROM: Full ? ? ? Dental ?no notable dental hx. ? ?  ?Pulmonary ?neg pulmonary ROS,  ?  ?Pulmonary exam normal ?breath sounds clear to auscultation ? ? ? ? ? ? Cardiovascular ?hypertension, Pt. on medications ?+ Peripheral Vascular Disease  ?Normal cardiovascular exam ?Rhythm:Regular Rate:Normal ? ? ?  ?Neuro/Psych ?negative neurological ROS ? negative psych ROS  ? GI/Hepatic ?negative GI ROS, Neg liver ROS,   ?Endo/Other  ?Morbid obesity ? Renal/GU ?negative Renal ROS  ?negative genitourinary ?  ?Musculoskeletal ?negative musculoskeletal ROS ?(+)  ? Abdominal ?  ?Peds ?negative pediatric ROS ?(+)  Hematology ?negative hematology ROS ?(+)   ?Anesthesia Other Findings ? ? Reproductive/Obstetrics ?negative OB ROS ? ?  ? ? ? ? ? ? ? ? ? ? ? ? ? ?  ?  ? ? ? ? ? ? ? ? ?Anesthesia Physical ?Anesthesia Plan ? ?ASA: 3 ? ?Anesthesia Plan: MAC  ? ?Post-op Pain Management:   ? ?Induction: Intravenous ? ?PONV Risk Score and Plan: 1 and Propofol infusion and Treatment may vary due to age or medical condition ? ?Airway Management Planned: Simple Face Mask ? ?Additional Equipment:  ? ?Intra-op Plan:  ? ?Post-operative Plan:  ? ?Informed Consent: I have reviewed the patients History and Physical, chart, labs and discussed the procedure including the risks, benefits and alternatives for the proposed anesthesia with the patient or authorized representative who has indicated his/her understanding and acceptance.  ? ? ? ?Dental advisory given ? ?Plan Discussed with: CRNA and Surgeon ? ?Anesthesia Plan Comments:   ? ? ? ? ? ? ?Anesthesia Quick Evaluation ? ?

## 2022-01-21 NOTE — Anesthesia Postprocedure Evaluation (Signed)
Anesthesia Post Note ? ?Patient: IGNATZ DEIS ? ?Procedure(s) Performed: GOLD SEED IMPLANT (Prostate) ?SPACE OAR INSTILLATION (Perineum) ? ?  ? ?Patient location during evaluation: PACU ?Anesthesia Type: MAC ?Level of consciousness: awake and alert ?Pain management: pain level controlled ?Vital Signs Assessment: post-procedure vital signs reviewed and stable ?Respiratory status: spontaneous breathing, nonlabored ventilation, respiratory function stable and patient connected to nasal cannula oxygen ?Cardiovascular status: stable and blood pressure returned to baseline ?Postop Assessment: no apparent nausea or vomiting ?Anesthetic complications: no ? ? ?No notable events documented. ? ?Last Vitals:  ?Vitals:  ? 01/21/22 0945 01/21/22 1000  ?BP: (!) 128/92 (!) 152/104  ?Pulse: 82   ?Resp: 18 17  ?Temp:    ?SpO2: 92% 93%  ?  ?Last Pain:  ?Vitals:  ? 01/21/22 0945  ?TempSrc:   ?PainSc: 0-No pain  ? ? ?  ?  ?  ?  ?  ?  ? ?Kartier Bennison S ? ? ? ? ?

## 2022-01-21 NOTE — Op Note (Signed)
Preoperative diagnosis: Clinically localized adenocarcinoma of the prostate  ? ?Postoperative diagnosis: Clinically localized adenocarcinoma of the prostate ? ?Procedure: 1) Placement of fiducial markers into prostate ?                   2) Insertion of SpaceOAR hydrogel  ? ?Surgeon:Luke Coy Rochford. M.D. ? ?Anesthesia: General ? ?EBL: Minimal ? ?Complications: None ? ?Indication: Robert [name] is a 66 y.o. gentleman with clinically localized prostate cancer. After discussing management options for treatment, he elected to proceed with radiotherapy. He presents today for the above procedures. The potential risks, complications, alternative options, and expected recovery course have been discussed in detail with the patient and he has provided informed consent to proceed. ? ?Description of procedure: The patient was administered preoperative antibiotics, placed in the dorsal lithotomy position, and prepped and draped in the usual sterile fashion. Next, transrectal ultrasonography was utilized to visualize the prostate. Three gold fiducial markers were then placed into the prostate via transperineal needles under ultrasound guidance at the left apex, left base, and right mid gland under direct ultrasound guidance. A site in the midline was then selected on the perineum for placement of an 18 g needle with saline. The needle was advanced above the rectum and below Denonvillier's fascia to the mid gland and confirmed to be in the midline on transverse imaging. One cc of saline was injected confirming appropriate expansion of this space. A total of 5 cc of saline was then injected to open the space further bilaterally. The saline syringe was then removed and the SpaceOAR hydrogel was injected with good distribution bilaterally. He tolerated the procedure well and without complications. He was given a voiding trial prior to discharge from the PACU.  ?

## 2022-01-21 NOTE — Discharge Instructions (Addendum)
You should avoid strenuous activities today but may resume all normal activities tomorrow. ? ?2.   You can take Tylenol and/or ibuprofen as needed for any pain or discomfort. ? ?3.    Follow up with your radiation oncologist for your simulation appointment as scheduled.  If this is not currently scheduled or you do not know the date/time for that appointment, please contact the radiation oncology office to confirm. ? ? ? ?Post Anesthesia Home Care Instructions ? ?Activity: ?Get plenty of rest for the remainder of the day. A responsible individual must stay with you for 24 hours following the procedure.  ?For the next 24 hours, DO NOT: ?-Drive a car ?-Paediatric nurse ?-Drink alcoholic beverages ?-Take any medication unless instructed by your physician ?-Make any legal decisions or sign important papers. ? ?Meals: ?Start with liquid foods such as gelatin or soup. Progress to regular foods as tolerated. Avoid greasy, spicy, heavy foods. If nausea and/or vomiting occur, drink only clear liquids until the nausea and/or vomiting subsides. Call your physician if vomiting continues. ? ?Special Instructions/Symptoms: ?Your throat may feel dry or sore from the anesthesia or the breathing tube placed in your throat during surgery. If this causes discomfort, gargle with warm salt water. The discomfort should disappear within 24 hours. ? ?Call your surgeon if you experience:  ? ?1.  Fever over 101.0. ?2.  Inability to urinate. ?3.  Nausea and/or vomiting. ?4.  Extreme swelling or bruising at the surgical site. ?5.  Continued bleeding from the incision. ?6.  Increased pain, redness or drainage from the incision. ?7.  Problems related to your pain medication. ?8.  Any problems and/or concerns. ?    ? ? ? ?

## 2022-01-21 NOTE — Anesthesia Procedure Notes (Addendum)
Procedure Name: [place] ?Date/Time: 01/21/2022 8:30 AM ?Performed by: Mechele Claude, CRNA ?Pre-anesthesia Checklist: Timeout performed, Patient being monitored, Suction available, Emergency Drugs available and Patient identified ?Patient Re-evaluated:Patient Re-evaluated prior to induction ?Oxygen Delivery Method: Simple face mask ?Preoxygenation: Pre-oxygenation with 100% oxygen ?Induction Type: IV induction ?Ventilation: Nasal airway inserted- appropriate to patient size ?Placement Confirmation: breath sounds checked- equal and bilateral, CO2 detector and positive ETCO2 ?Comments: Nasal trumpet # 9 inserted for optimum Mask ventilations  ? ? ? ? ?

## 2022-01-22 ENCOUNTER — Encounter (HOSPITAL_BASED_OUTPATIENT_CLINIC_OR_DEPARTMENT_OTHER): Payer: Self-pay | Admitting: Urology

## 2022-01-23 ENCOUNTER — Ambulatory Visit: Payer: 59 | Admitting: Radiation Oncology

## 2022-01-26 ENCOUNTER — Telehealth: Payer: Self-pay | Admitting: *Deleted

## 2022-01-26 NOTE — Telephone Encounter (Signed)
CALLED PATIENT TO REMIND OF SIM APPT. FOR 01-27-22- ARRIVAL TIME- 8:45 AM @ [facility], INFORMED PATIENT TO ARRIVE WITH A FULL BLADDER AND AN EMPTY BOWEL, SPOKE WITH PATIENT AND HE IS AWARE OF THIS APPT. AND THE RESTRICTIONS ?

## 2022-01-27 ENCOUNTER — Ambulatory Visit
Admission: RE | Admit: 2022-01-27 | Discharge: 2022-01-27 | Disposition: A | Discharge status: Home / Self Care | Payer: 59 | Source: Ambulatory Visit | Attending: Radiation Oncology | Admitting: Radiation Oncology

## 2022-01-27 DIAGNOSIS — C61 Malignant neoplasm of prostate: Secondary | ICD-10-CM | POA: Diagnosis present

## 2022-01-27 DIAGNOSIS — Z510 Encounter for antineoplastic radiation therapy: Secondary | ICD-10-CM | POA: Insufficient documentation

## 2022-01-27 NOTE — Progress Notes (Signed)
?  Radiation Oncology         [phone] ?________________________________ ? ?Name: TAMARICK KOVALCIK MRN: [id number]  ?Date: 01/27/2022  DOB: Jan 04, 1956 ? ?SIMULATION AND TREATMENT PLANNING NOTE ? ?  ICD-10-CM   ?1. Malignant neoplasm of prostate ([place])  C61   ?  ? ? ?DIAGNOSIS:  66 y.o. gentleman with Stage T1c adenocarcinoma of the prostate with Gleason score of 4+3, and PSA of 4.59. ? ?NARRATIVE:  The patient was brought to the [facility].  Identity was confirmed.  All relevant records and images related to the planned course of therapy were reviewed.  The patient freely provided informed written consent to proceed with treatment after reviewing the details related to the planned course of therapy. The consent form was witnessed and verified by the simulation staff.  Then, the patient was set-up in a stable reproducible supine position for radiation therapy.  A vacuum lock pillow device was custom fabricated to position his legs in a reproducible immobilized position.  Then, I performed a urethrogram under sterile conditions to identify the prostatic apex.  CT images were obtained.  Surface markings were placed.  The CT images were loaded into the planning software.  Then the prostate target and avoidance structures including the rectum, bladder, bowel and hips were contoured.  Treatment planning then occurred.  The radiation prescription was entered and confirmed.  A total of one complex treatment devices was fabricated. I have requested : Intensity Modulated Radiotherapy (IMRT) is medically necessary for this case for the following reason:  Rectal sparing.. ? ?PLAN:  The patient will receive 70 Gy in 28 fractions. ? ?________________________________ ? ?Sheral Apley Tammi Klippel, M.D. ? ?

## 2022-01-30 ENCOUNTER — Telehealth: Payer: Self-pay

## 2022-01-30 NOTE — Telephone Encounter (Signed)
Notified Patient of completion of Disability paperwork. Fax transmission confirmation received. Copy of paperwork sent to Patient via e-mail provided. No other needs or concerns voiced at this time. ?

## 2022-02-04 ENCOUNTER — Telehealth: Payer: Self-pay | Admitting: *Deleted

## 2022-02-04 DIAGNOSIS — Z510 Encounter for antineoplastic radiation therapy: Secondary | ICD-10-CM | POA: Diagnosis not present

## 2022-02-04 NOTE — Telephone Encounter (Signed)
"  Robert [name] [phone]), D.O.B. is [birth year]; patient of Dr. Tammi Klippel.  I got a call from [facility].  They indicated they need documents from your office, I just got this information from my wife, she said an e-mail just came in to notify me Robert [name] is in need of documents from your office.  I think this is concerning my FMLA.  It might be both FMLA and short term disability to complete my FMLA and/or disability.  Please give me a call back, you need to contact [facility] but you can give me a call back." ? ?Call forwarded to Radiation.  Forms nurses nor [facility] H.I[facility] manage record releases.  SW H.I.M. forwards radiation record requests to radiation.    ?

## 2022-02-05 ENCOUNTER — Other Ambulatory Visit: Payer: Self-pay

## 2022-02-05 ENCOUNTER — Ambulatory Visit
Admission: RE | Admit: 2022-02-05 | Discharge: 2022-02-05 | Disposition: A | Discharge status: Home / Self Care | Payer: 59 | Source: Ambulatory Visit | Attending: Radiation Oncology | Admitting: Radiation Oncology

## 2022-02-05 DIAGNOSIS — Z510 Encounter for antineoplastic radiation therapy: Secondary | ICD-10-CM | POA: Diagnosis not present

## 2022-02-05 LAB — RAD ONC ARIA SESSION SUMMARY
Course Elapsed Days: 0
Plan Fractions Treated to Date: 1
Plan Prescribed Dose Per Fraction: 2.5 Gy
Plan Total Fractions Prescribed: 28
Plan Total Prescribed Dose: 70 Gy
Reference Point Dosage Given to Date: 2.5 Gy
Reference Point Session Dosage Given: 2.5 Gy
Session Number: 1

## 2022-02-06 ENCOUNTER — Other Ambulatory Visit: Payer: Self-pay

## 2022-02-06 ENCOUNTER — Ambulatory Visit
Admission: RE | Admit: 2022-02-06 | Discharge: 2022-02-06 | Disposition: A | Discharge status: Home / Self Care | Payer: 59 | Source: Ambulatory Visit | Attending: Radiation Oncology | Admitting: Radiation Oncology

## 2022-02-06 DIAGNOSIS — Z510 Encounter for antineoplastic radiation therapy: Secondary | ICD-10-CM | POA: Diagnosis not present

## 2022-02-06 LAB — RAD ONC ARIA SESSION SUMMARY
Course Elapsed Days: 1
Plan Fractions Treated to Date: 2
Plan Prescribed Dose Per Fraction: 2.5 Gy
Plan Total Fractions Prescribed: 28
Plan Total Prescribed Dose: 70 Gy
Reference Point Dosage Given to Date: 5 Gy
Reference Point Session Dosage Given: 2.5 Gy
Session Number: 2

## 2022-02-09 ENCOUNTER — Other Ambulatory Visit: Payer: Self-pay

## 2022-02-09 ENCOUNTER — Ambulatory Visit
Admission: RE | Admit: 2022-02-09 | Discharge: 2022-02-09 | Disposition: A | Discharge status: Home / Self Care | Payer: 59 | Source: Ambulatory Visit | Attending: Radiation Oncology | Admitting: Radiation Oncology

## 2022-02-09 DIAGNOSIS — Z510 Encounter for antineoplastic radiation therapy: Secondary | ICD-10-CM | POA: Diagnosis not present

## 2022-02-09 LAB — RAD ONC ARIA SESSION SUMMARY
Course Elapsed Days: 4
Plan Fractions Treated to Date: 3
Plan Prescribed Dose Per Fraction: 2.5 Gy
Plan Total Fractions Prescribed: 28
Plan Total Prescribed Dose: 70 Gy
Reference Point Dosage Given to Date: 7.5 Gy
Reference Point Session Dosage Given: 2.5 Gy
Session Number: 3

## 2022-02-10 ENCOUNTER — Other Ambulatory Visit: Payer: Self-pay

## 2022-02-10 ENCOUNTER — Ambulatory Visit
Admission: RE | Admit: 2022-02-10 | Discharge: 2022-02-10 | Disposition: A | Discharge status: Home / Self Care | Payer: 59 | Source: Ambulatory Visit | Attending: Radiation Oncology | Admitting: Radiation Oncology

## 2022-02-10 DIAGNOSIS — Z510 Encounter for antineoplastic radiation therapy: Secondary | ICD-10-CM | POA: Diagnosis not present

## 2022-02-10 LAB — RAD ONC ARIA SESSION SUMMARY
Course Elapsed Days: 5
Plan Fractions Treated to Date: 4
Plan Prescribed Dose Per Fraction: 2.5 Gy
Plan Total Fractions Prescribed: 28
Plan Total Prescribed Dose: 70 Gy
Reference Point Dosage Given to Date: 10 Gy
Reference Point Session Dosage Given: 2.5 Gy
Session Number: 4

## 2022-02-11 ENCOUNTER — Ambulatory Visit
Admission: RE | Admit: 2022-02-11 | Discharge: 2022-02-11 | Disposition: A | Discharge status: Home / Self Care | Payer: 59 | Source: Ambulatory Visit | Attending: Radiation Oncology | Admitting: Radiation Oncology

## 2022-02-11 ENCOUNTER — Other Ambulatory Visit: Payer: Self-pay

## 2022-02-11 DIAGNOSIS — Z510 Encounter for antineoplastic radiation therapy: Secondary | ICD-10-CM | POA: Diagnosis not present

## 2022-02-11 LAB — RAD ONC ARIA SESSION SUMMARY
Course Elapsed Days: 6
Plan Fractions Treated to Date: 5
Plan Prescribed Dose Per Fraction: 2.5 Gy
Plan Total Fractions Prescribed: 28
Plan Total Prescribed Dose: 70 Gy
Reference Point Dosage Given to Date: 12.5 Gy
Reference Point Session Dosage Given: 2.5 Gy
Session Number: 5

## 2022-02-12 ENCOUNTER — Ambulatory Visit
Admission: RE | Admit: 2022-02-12 | Discharge: 2022-02-12 | Disposition: A | Discharge status: Home / Self Care | Payer: 59 | Source: Ambulatory Visit | Attending: Radiation Oncology | Admitting: Radiation Oncology

## 2022-02-12 ENCOUNTER — Other Ambulatory Visit: Payer: Self-pay

## 2022-02-12 DIAGNOSIS — Z510 Encounter for antineoplastic radiation therapy: Secondary | ICD-10-CM | POA: Diagnosis not present

## 2022-02-12 LAB — RAD ONC ARIA SESSION SUMMARY
Course Elapsed Days: 7
Plan Fractions Treated to Date: 6
Plan Prescribed Dose Per Fraction: 2.5 Gy
Plan Total Fractions Prescribed: 28
Plan Total Prescribed Dose: 70 Gy
Reference Point Dosage Given to Date: 15 Gy
Reference Point Session Dosage Given: 2.5 Gy
Session Number: 6

## 2022-02-13 ENCOUNTER — Ambulatory Visit
Admission: RE | Admit: 2022-02-13 | Discharge: 2022-02-13 | Disposition: A | Discharge status: Home / Self Care | Payer: 59 | Source: Ambulatory Visit | Attending: Radiation Oncology | Admitting: Radiation Oncology

## 2022-02-13 ENCOUNTER — Other Ambulatory Visit: Payer: Self-pay

## 2022-02-13 DIAGNOSIS — Z510 Encounter for antineoplastic radiation therapy: Secondary | ICD-10-CM | POA: Diagnosis not present

## 2022-02-13 LAB — RAD ONC ARIA SESSION SUMMARY
Course Elapsed Days: 8
Plan Fractions Treated to Date: 7
Plan Prescribed Dose Per Fraction: 2.5 Gy
Plan Total Fractions Prescribed: 28
Plan Total Prescribed Dose: 70 Gy
Reference Point Dosage Given to Date: 17.5 Gy
Reference Point Session Dosage Given: 2.5 Gy
Session Number: 7

## 2022-02-17 ENCOUNTER — Ambulatory Visit
Admission: RE | Admit: 2022-02-17 | Discharge: 2022-02-17 | Disposition: A | Discharge status: Home / Self Care | Payer: 59 | Source: Ambulatory Visit | Attending: Radiation Oncology | Admitting: Radiation Oncology

## 2022-02-17 ENCOUNTER — Other Ambulatory Visit: Payer: Self-pay

## 2022-02-17 DIAGNOSIS — Z510 Encounter for antineoplastic radiation therapy: Secondary | ICD-10-CM | POA: Diagnosis not present

## 2022-02-17 LAB — RAD ONC ARIA SESSION SUMMARY
Course Elapsed Days: 12
Plan Fractions Treated to Date: 8
Plan Prescribed Dose Per Fraction: 2.5 Gy
Plan Total Fractions Prescribed: 28
Plan Total Prescribed Dose: 70 Gy
Reference Point Dosage Given to Date: 20 Gy
Reference Point Session Dosage Given: 2.5 Gy
Session Number: 8

## 2022-02-18 ENCOUNTER — Other Ambulatory Visit: Payer: Self-pay

## 2022-02-18 ENCOUNTER — Ambulatory Visit
Admission: RE | Admit: 2022-02-18 | Discharge: 2022-02-18 | Disposition: A | Discharge status: Home / Self Care | Payer: 59 | Source: Ambulatory Visit | Attending: Radiation Oncology | Admitting: Radiation Oncology

## 2022-02-18 DIAGNOSIS — Z510 Encounter for antineoplastic radiation therapy: Secondary | ICD-10-CM | POA: Diagnosis not present

## 2022-02-18 LAB — RAD ONC ARIA SESSION SUMMARY
Course Elapsed Days: 13
Plan Fractions Treated to Date: 9
Plan Prescribed Dose Per Fraction: 2.5 Gy
Plan Total Fractions Prescribed: 28
Plan Total Prescribed Dose: 70 Gy
Reference Point Dosage Given to Date: 22.5 Gy
Reference Point Session Dosage Given: 2.5 Gy
Session Number: 9

## 2022-02-19 ENCOUNTER — Other Ambulatory Visit: Payer: Self-pay

## 2022-02-19 ENCOUNTER — Ambulatory Visit
Admission: RE | Admit: 2022-02-19 | Discharge: 2022-02-19 | Disposition: A | Discharge status: Home / Self Care | Payer: 59 | Source: Ambulatory Visit | Attending: Radiation Oncology | Admitting: Radiation Oncology

## 2022-02-19 DIAGNOSIS — Z510 Encounter for antineoplastic radiation therapy: Secondary | ICD-10-CM | POA: Diagnosis present

## 2022-02-19 DIAGNOSIS — C61 Malignant neoplasm of prostate: Secondary | ICD-10-CM | POA: Insufficient documentation

## 2022-02-19 LAB — RAD ONC ARIA SESSION SUMMARY
Course Elapsed Days: 14
Plan Fractions Treated to Date: 10
Plan Prescribed Dose Per Fraction: 2.5 Gy
Plan Total Fractions Prescribed: 28
Plan Total Prescribed Dose: 70 Gy
Reference Point Dosage Given to Date: 25 Gy
Reference Point Session Dosage Given: 2.5 Gy
Session Number: 10

## 2022-02-20 ENCOUNTER — Ambulatory Visit
Admission: RE | Admit: 2022-02-20 | Discharge: 2022-02-20 | Disposition: A | Discharge status: Home / Self Care | Payer: 59 | Source: Ambulatory Visit | Attending: Radiation Oncology | Admitting: Radiation Oncology

## 2022-02-20 ENCOUNTER — Other Ambulatory Visit: Payer: Self-pay

## 2022-02-20 DIAGNOSIS — Z510 Encounter for antineoplastic radiation therapy: Secondary | ICD-10-CM | POA: Diagnosis not present

## 2022-02-20 LAB — RAD ONC ARIA SESSION SUMMARY
Course Elapsed Days: 15
Plan Fractions Treated to Date: 11
Plan Prescribed Dose Per Fraction: 2.5 Gy
Plan Total Fractions Prescribed: 28
Plan Total Prescribed Dose: 70 Gy
Reference Point Dosage Given to Date: 27.5 Gy
Reference Point Session Dosage Given: 2.5 Gy
Session Number: 11

## 2022-02-23 ENCOUNTER — Other Ambulatory Visit: Payer: Self-pay

## 2022-02-23 ENCOUNTER — Ambulatory Visit
Admission: RE | Admit: 2022-02-23 | Discharge: 2022-02-23 | Disposition: A | Discharge status: Home / Self Care | Payer: 59 | Source: Ambulatory Visit | Attending: Radiation Oncology | Admitting: Radiation Oncology

## 2022-02-23 DIAGNOSIS — Z510 Encounter for antineoplastic radiation therapy: Secondary | ICD-10-CM | POA: Diagnosis not present

## 2022-02-23 LAB — RAD ONC ARIA SESSION SUMMARY
Course Elapsed Days: 18
Plan Fractions Treated to Date: 12
Plan Prescribed Dose Per Fraction: 2.5 Gy
Plan Total Fractions Prescribed: 28
Plan Total Prescribed Dose: 70 Gy
Reference Point Dosage Given to Date: 30 Gy
Reference Point Session Dosage Given: 2.5 Gy
Session Number: 12

## 2022-02-24 ENCOUNTER — Other Ambulatory Visit: Payer: Self-pay

## 2022-02-24 ENCOUNTER — Ambulatory Visit
Admission: RE | Admit: 2022-02-24 | Discharge: 2022-02-24 | Disposition: A | Discharge status: Home / Self Care | Payer: 59 | Source: Ambulatory Visit | Attending: Radiation Oncology | Admitting: Radiation Oncology

## 2022-02-24 DIAGNOSIS — Z510 Encounter for antineoplastic radiation therapy: Secondary | ICD-10-CM | POA: Diagnosis not present

## 2022-02-24 LAB — RAD ONC ARIA SESSION SUMMARY
Course Elapsed Days: 19
Plan Fractions Treated to Date: 13
Plan Prescribed Dose Per Fraction: 2.5 Gy
Plan Total Fractions Prescribed: 28
Plan Total Prescribed Dose: 70 Gy
Reference Point Dosage Given to Date: 32.5 Gy
Reference Point Session Dosage Given: 2.5 Gy
Session Number: 13

## 2022-02-25 ENCOUNTER — Ambulatory Visit
Admission: RE | Admit: 2022-02-25 | Discharge: 2022-02-25 | Disposition: A | Discharge status: Home / Self Care | Payer: 59 | Source: Ambulatory Visit | Attending: Radiation Oncology | Admitting: Radiation Oncology

## 2022-02-25 ENCOUNTER — Other Ambulatory Visit: Payer: Self-pay

## 2022-02-25 DIAGNOSIS — Z510 Encounter for antineoplastic radiation therapy: Secondary | ICD-10-CM | POA: Diagnosis not present

## 2022-02-25 LAB — RAD ONC ARIA SESSION SUMMARY
Course Elapsed Days: 20
Plan Fractions Treated to Date: 14
Plan Prescribed Dose Per Fraction: 2.5 Gy
Plan Total Fractions Prescribed: 28
Plan Total Prescribed Dose: 70 Gy
Reference Point Dosage Given to Date: 35 Gy
Reference Point Session Dosage Given: 2.5 Gy
Session Number: 14

## 2022-02-26 ENCOUNTER — Ambulatory Visit
Admission: RE | Admit: 2022-02-26 | Discharge: 2022-02-26 | Disposition: A | Discharge status: Home / Self Care | Payer: 59 | Source: Ambulatory Visit | Attending: Radiation Oncology | Admitting: Radiation Oncology

## 2022-02-26 ENCOUNTER — Other Ambulatory Visit: Payer: Self-pay

## 2022-02-26 DIAGNOSIS — Z510 Encounter for antineoplastic radiation therapy: Secondary | ICD-10-CM | POA: Diagnosis not present

## 2022-02-26 LAB — RAD ONC ARIA SESSION SUMMARY
Course Elapsed Days: 21
Plan Fractions Treated to Date: 15
Plan Prescribed Dose Per Fraction: 2.5 Gy
Plan Total Fractions Prescribed: 28
Plan Total Prescribed Dose: 70 Gy
Reference Point Dosage Given to Date: 37.5 Gy
Reference Point Session Dosage Given: 2.5 Gy
Session Number: 15

## 2022-02-27 ENCOUNTER — Ambulatory Visit
Admission: RE | Admit: 2022-02-27 | Discharge: 2022-02-27 | Disposition: A | Discharge status: Home / Self Care | Payer: 59 | Source: Ambulatory Visit | Attending: Radiation Oncology | Admitting: Radiation Oncology

## 2022-02-27 ENCOUNTER — Other Ambulatory Visit: Payer: Self-pay

## 2022-02-27 DIAGNOSIS — Z510 Encounter for antineoplastic radiation therapy: Secondary | ICD-10-CM | POA: Diagnosis not present

## 2022-02-27 LAB — RAD ONC ARIA SESSION SUMMARY
Course Elapsed Days: 22
Plan Fractions Treated to Date: 16
Plan Prescribed Dose Per Fraction: 2.5 Gy
Plan Total Fractions Prescribed: 28
Plan Total Prescribed Dose: 70 Gy
Reference Point Dosage Given to Date: 40 Gy
Reference Point Session Dosage Given: 2.5 Gy
Session Number: 16

## 2022-03-02 ENCOUNTER — Ambulatory Visit
Admission: RE | Admit: 2022-03-02 | Discharge: 2022-03-02 | Disposition: A | Discharge status: Home / Self Care | Payer: 59 | Source: Ambulatory Visit | Attending: Radiation Oncology | Admitting: Radiation Oncology

## 2022-03-02 ENCOUNTER — Other Ambulatory Visit: Payer: Self-pay

## 2022-03-02 DIAGNOSIS — Z510 Encounter for antineoplastic radiation therapy: Secondary | ICD-10-CM | POA: Diagnosis not present

## 2022-03-02 LAB — RAD ONC ARIA SESSION SUMMARY
Course Elapsed Days: 25
Plan Fractions Treated to Date: 17
Plan Prescribed Dose Per Fraction: 2.5 Gy
Plan Total Fractions Prescribed: 28
Plan Total Prescribed Dose: 70 Gy
Reference Point Dosage Given to Date: 42.5 Gy
Reference Point Session Dosage Given: 2.5 Gy
Session Number: 17

## 2022-03-03 ENCOUNTER — Ambulatory Visit
Admission: RE | Admit: 2022-03-03 | Discharge: 2022-03-03 | Disposition: A | Discharge status: Home / Self Care | Payer: 59 | Source: Ambulatory Visit | Attending: Radiation Oncology | Admitting: Radiation Oncology

## 2022-03-03 ENCOUNTER — Other Ambulatory Visit: Payer: Self-pay

## 2022-03-03 DIAGNOSIS — Z510 Encounter for antineoplastic radiation therapy: Secondary | ICD-10-CM | POA: Diagnosis not present

## 2022-03-03 LAB — RAD ONC ARIA SESSION SUMMARY
Course Elapsed Days: 26
Plan Fractions Treated to Date: 18
Plan Prescribed Dose Per Fraction: 2.5 Gy
Plan Total Fractions Prescribed: 28
Plan Total Prescribed Dose: 70 Gy
Reference Point Dosage Given to Date: 45 Gy
Reference Point Session Dosage Given: 2.5 Gy
Session Number: 18

## 2022-03-04 ENCOUNTER — Other Ambulatory Visit: Payer: Self-pay

## 2022-03-04 ENCOUNTER — Ambulatory Visit
Admission: RE | Admit: 2022-03-04 | Discharge: 2022-03-04 | Disposition: A | Discharge status: Home / Self Care | Payer: 59 | Source: Ambulatory Visit | Attending: Radiation Oncology | Admitting: Radiation Oncology

## 2022-03-04 DIAGNOSIS — Z510 Encounter for antineoplastic radiation therapy: Secondary | ICD-10-CM | POA: Diagnosis not present

## 2022-03-04 LAB — RAD ONC ARIA SESSION SUMMARY
Course Elapsed Days: 27
Plan Fractions Treated to Date: 19
Plan Prescribed Dose Per Fraction: 2.5 Gy
Plan Total Fractions Prescribed: 28
Plan Total Prescribed Dose: 70 Gy
Reference Point Dosage Given to Date: 47.5 Gy
Reference Point Session Dosage Given: 2.5 Gy
Session Number: 19

## 2022-03-05 ENCOUNTER — Other Ambulatory Visit: Payer: Self-pay

## 2022-03-05 ENCOUNTER — Ambulatory Visit
Admission: RE | Admit: 2022-03-05 | Discharge: 2022-03-05 | Disposition: A | Discharge status: Home / Self Care | Payer: 59 | Source: Ambulatory Visit | Attending: Radiation Oncology | Admitting: Radiation Oncology

## 2022-03-05 DIAGNOSIS — Z510 Encounter for antineoplastic radiation therapy: Secondary | ICD-10-CM | POA: Diagnosis not present

## 2022-03-05 LAB — RAD ONC ARIA SESSION SUMMARY
Course Elapsed Days: 28
Plan Fractions Treated to Date: 20
Plan Prescribed Dose Per Fraction: 2.5 Gy
Plan Total Fractions Prescribed: 28
Plan Total Prescribed Dose: 70 Gy
Reference Point Dosage Given to Date: 50 Gy
Reference Point Session Dosage Given: 2.5 Gy
Session Number: 20

## 2022-03-06 ENCOUNTER — Ambulatory Visit: Payer: 59

## 2022-03-06 ENCOUNTER — Ambulatory Visit
Admission: RE | Admit: 2022-03-06 | Discharge: 2022-03-06 | Disposition: A | Discharge status: Home / Self Care | Payer: 59 | Source: Ambulatory Visit | Attending: Radiation Oncology | Admitting: Radiation Oncology

## 2022-03-06 ENCOUNTER — Other Ambulatory Visit: Payer: Self-pay

## 2022-03-06 DIAGNOSIS — Z510 Encounter for antineoplastic radiation therapy: Secondary | ICD-10-CM | POA: Diagnosis not present

## 2022-03-06 LAB — RAD ONC ARIA SESSION SUMMARY
Course Elapsed Days: 29
Plan Fractions Treated to Date: 21
Plan Prescribed Dose Per Fraction: 2.5 Gy
Plan Total Fractions Prescribed: 28
Plan Total Prescribed Dose: 70 Gy
Reference Point Dosage Given to Date: 52.5 Gy
Reference Point Session Dosage Given: 2.5 Gy
Session Number: 21

## 2022-03-09 ENCOUNTER — Other Ambulatory Visit: Payer: Self-pay

## 2022-03-09 ENCOUNTER — Ambulatory Visit
Admission: RE | Admit: 2022-03-09 | Discharge: 2022-03-09 | Disposition: A | Discharge status: Home / Self Care | Payer: 59 | Source: Ambulatory Visit | Attending: Radiation Oncology | Admitting: Radiation Oncology

## 2022-03-09 DIAGNOSIS — Z510 Encounter for antineoplastic radiation therapy: Secondary | ICD-10-CM | POA: Diagnosis not present

## 2022-03-09 LAB — RAD ONC ARIA SESSION SUMMARY
Course Elapsed Days: 32
Plan Fractions Treated to Date: 22
Plan Prescribed Dose Per Fraction: 2.5 Gy
Plan Total Fractions Prescribed: 28
Plan Total Prescribed Dose: 70 Gy
Reference Point Dosage Given to Date: 55 Gy
Reference Point Session Dosage Given: 2.5 Gy
Session Number: 22

## 2022-03-10 ENCOUNTER — Ambulatory Visit
Admission: RE | Admit: 2022-03-10 | Discharge: 2022-03-10 | Disposition: A | Discharge status: Home / Self Care | Payer: 59 | Source: Ambulatory Visit | Attending: Radiation Oncology | Admitting: Radiation Oncology

## 2022-03-10 ENCOUNTER — Other Ambulatory Visit: Payer: Self-pay

## 2022-03-10 DIAGNOSIS — Z510 Encounter for antineoplastic radiation therapy: Secondary | ICD-10-CM | POA: Diagnosis not present

## 2022-03-10 LAB — RAD ONC ARIA SESSION SUMMARY
Course Elapsed Days: 33
Plan Fractions Treated to Date: 23
Plan Prescribed Dose Per Fraction: 2.5 Gy
Plan Total Fractions Prescribed: 28
Plan Total Prescribed Dose: 70 Gy
Reference Point Dosage Given to Date: 57.5 Gy
Reference Point Session Dosage Given: 2.5 Gy
Session Number: 23

## 2022-03-11 ENCOUNTER — Ambulatory Visit
Admission: RE | Admit: 2022-03-11 | Discharge: 2022-03-11 | Disposition: A | Discharge status: Home / Self Care | Payer: 59 | Source: Ambulatory Visit | Attending: Radiation Oncology | Admitting: Radiation Oncology

## 2022-03-11 ENCOUNTER — Other Ambulatory Visit: Payer: Self-pay

## 2022-03-11 DIAGNOSIS — Z510 Encounter for antineoplastic radiation therapy: Secondary | ICD-10-CM | POA: Diagnosis not present

## 2022-03-11 LAB — RAD ONC ARIA SESSION SUMMARY
Course Elapsed Days: 34
Plan Fractions Treated to Date: 24
Plan Prescribed Dose Per Fraction: 2.5 Gy
Plan Total Fractions Prescribed: 28
Plan Total Prescribed Dose: 70 Gy
Reference Point Dosage Given to Date: 60 Gy
Reference Point Session Dosage Given: 2.5 Gy
Session Number: 24

## 2022-03-12 ENCOUNTER — Telehealth: Payer: Self-pay

## 2022-03-12 ENCOUNTER — Other Ambulatory Visit: Payer: Self-pay

## 2022-03-12 ENCOUNTER — Ambulatory Visit
Admission: RE | Admit: 2022-03-12 | Discharge: 2022-03-12 | Disposition: A | Discharge status: Home / Self Care | Payer: 59 | Source: Ambulatory Visit | Attending: Radiation Oncology | Admitting: Radiation Oncology

## 2022-03-12 DIAGNOSIS — Z510 Encounter for antineoplastic radiation therapy: Secondary | ICD-10-CM | POA: Diagnosis not present

## 2022-03-12 LAB — RAD ONC ARIA SESSION SUMMARY
Course Elapsed Days: 35
Plan Fractions Treated to Date: 25
Plan Prescribed Dose Per Fraction: 2.5 Gy
Plan Total Fractions Prescribed: 28
Plan Total Prescribed Dose: 70 Gy
Reference Point Dosage Given to Date: 62.5 Gy
Reference Point Session Dosage Given: 2.5 Gy
Session Number: 25

## 2022-03-12 NOTE — Telephone Encounter (Signed)
Notified Patient of completion of Disability Forms. Fax transmission confirmation received. Copy of forms sent to Patient at e-mail address [email or web address] as requested.

## 2022-03-13 ENCOUNTER — Ambulatory Visit
Admission: RE | Admit: 2022-03-13 | Discharge: 2022-03-13 | Disposition: A | Discharge status: Home / Self Care | Payer: 59 | Source: Ambulatory Visit | Attending: Radiation Oncology | Admitting: Radiation Oncology

## 2022-03-13 ENCOUNTER — Other Ambulatory Visit: Payer: Self-pay

## 2022-03-13 DIAGNOSIS — Z510 Encounter for antineoplastic radiation therapy: Secondary | ICD-10-CM | POA: Diagnosis not present

## 2022-03-13 LAB — RAD ONC ARIA SESSION SUMMARY
Course Elapsed Days: 36
Plan Fractions Treated to Date: 26
Plan Prescribed Dose Per Fraction: 2.5 Gy
Plan Total Fractions Prescribed: 28
Plan Total Prescribed Dose: 70 Gy
Reference Point Dosage Given to Date: 65 Gy
Reference Point Session Dosage Given: 2.5 Gy
Session Number: 26

## 2022-03-16 ENCOUNTER — Ambulatory Visit
Admission: RE | Admit: 2022-03-16 | Discharge: 2022-03-16 | Disposition: A | Discharge status: Home / Self Care | Payer: 59 | Source: Ambulatory Visit | Attending: Radiation Oncology | Admitting: Radiation Oncology

## 2022-03-16 ENCOUNTER — Other Ambulatory Visit: Payer: Self-pay

## 2022-03-16 DIAGNOSIS — Z510 Encounter for antineoplastic radiation therapy: Secondary | ICD-10-CM | POA: Diagnosis not present

## 2022-03-16 LAB — RAD ONC ARIA SESSION SUMMARY
Course Elapsed Days: 39
Plan Fractions Treated to Date: 27
Plan Prescribed Dose Per Fraction: 2.5 Gy
Plan Total Fractions Prescribed: 28
Plan Total Prescribed Dose: 70 Gy
Reference Point Dosage Given to Date: 67.5 Gy
Reference Point Session Dosage Given: 2.5 Gy
Session Number: 27

## 2022-03-17 ENCOUNTER — Encounter: Payer: Self-pay | Admitting: Urology

## 2022-03-17 ENCOUNTER — Ambulatory Visit
Admission: RE | Admit: 2022-03-17 | Discharge: 2022-03-17 | Disposition: A | Discharge status: Home / Self Care | Payer: 59 | Source: Ambulatory Visit | Attending: Radiation Oncology | Admitting: Radiation Oncology

## 2022-03-17 ENCOUNTER — Other Ambulatory Visit: Payer: Self-pay

## 2022-03-17 DIAGNOSIS — Z510 Encounter for antineoplastic radiation therapy: Secondary | ICD-10-CM | POA: Diagnosis not present

## 2022-03-17 DIAGNOSIS — C61 Malignant neoplasm of prostate: Secondary | ICD-10-CM

## 2022-03-17 LAB — RAD ONC ARIA SESSION SUMMARY
Course Elapsed Days: 40
Plan Fractions Treated to Date: 28
Plan Prescribed Dose Per Fraction: 2.5 Gy
Plan Total Fractions Prescribed: 28
Plan Total Prescribed Dose: 70 Gy
Reference Point Dosage Given to Date: 70 Gy
Reference Point Session Dosage Given: 2.5 Gy
Session Number: 28

## 2022-03-27 ENCOUNTER — Telehealth: Payer: Self-pay

## 2022-03-27 NOTE — Telephone Encounter (Signed)
Return to work note

## 2022-04-01 ENCOUNTER — Telehealth: Payer: Self-pay

## 2022-04-02 ENCOUNTER — Telehealth: Payer: Self-pay

## 2022-04-02 NOTE — Telephone Encounter (Signed)
RN verified identity then spoke with patient about extension of his FLMA and explained to him that it's being worked on had to inform the people that works with the [facility] forms.

## 2022-04-09 ENCOUNTER — Telehealth: Payer: Self-pay

## 2022-04-09 NOTE — Telephone Encounter (Signed)
Notified Patient of completion of Leave Extension Forms. Fax transmission confirmation received. Copy of forms mailed to Patient as requested. No other needs or concerns voiced at this time.

## 2022-04-21 ENCOUNTER — Encounter: Payer: Self-pay | Admitting: Urology

## 2022-04-21 NOTE — Progress Notes (Signed)
Telephone appointment. I verified patient's identity and began nursing interview. Patient reports urinary urgency, and complains of a flaccid penis during sexual arousal. No other issues reported at this time.  Meaningful use complete. I-PSS score of 2-mild. No urinary management medications. Urology appt- None  Reminded patient of their 8:30am-03/23/22 telephone appointment w/ Ashlyn Bruning PA-C. I left my extension [phone] in case patient needs anything. Patient verbalized understanding.  Patient contact [phone]

## 2022-04-23 ENCOUNTER — Ambulatory Visit
Admission: RE | Admit: 2022-04-23 | Discharge: 2022-04-23 | Disposition: A | Discharge status: Home / Self Care | Payer: 59 | Source: Ambulatory Visit | Attending: Radiation Oncology | Admitting: Radiation Oncology

## 2022-04-23 DIAGNOSIS — C61 Malignant neoplasm of prostate: Secondary | ICD-10-CM | POA: Insufficient documentation

## 2022-04-23 NOTE — Progress Notes (Signed)
  Radiation Oncology         [phone] ________________________________  Name: Robert [name] MRN: [id number]  Date: 03/17/2022  DOB: [birth year]  End of Treatment Note  Diagnosis:    66 y.o. gentleman with Stage T1c adenocarcinoma of the prostate with Gleason score of 4+3, and PSA of 4.59.     Indication for treatment:  Curative, Definitive Radiotherapy       Radiation treatment dates:   02/05/22 - 03/17/22  Site/dose:   The prostate was treated to 70 Gy in 28 fractions of 2.5 Gy  Beams/energy:   The patient was treated with IMRT using volumetric arc therapy delivering 6 MV X-rays to clockwise and counterclockwise circumferential arcs with a 90 degree collimator offset to avoid dose scalloping.  Image guidance was performed with daily cone beam CT prior to each fraction to align to gold markers in the prostate and assure proper bladder and rectal fill volumes.  Immobilization was achieved with BodyFix custom mold.  Narrative: The patient tolerated radiation treatment relatively well with only minor urinary irritation and modest fatigue.  He did report increased nocturia x4, frequency and urgency but specifically denied dysuria, gross hematuria, straining to void or incontinence.  He denied any abdominal pain, nausea, vomiting, diarrhea or constipation.  Plan: The patient has completed radiation treatment. He will return to radiation oncology clinic for routine followup in one month. I advised him to call or return sooner if he has any questions or concerns related to his recovery or treatment. ________________________________  Sheral Apley. Tammi Klippel, M.D.

## 2022-04-23 NOTE — Progress Notes (Signed)
Radiation Oncology         [phone] ________________________________  Name: GREGORIO WORLEY MRN: [id number]  Date: 04/23/2022  DOB: 16-Jul-1956  Post Treatment Note  CC: Shirline Frees, MD  Vira Agar, MD  Diagnosis:    66 y.o. gentleman with Stage T1c adenocarcinoma of the prostate with Gleason score of 4+3, and PSA of 4.59.  Interval Since Last Radiation:  5.5 weeks   02/05/22 - 03/17/22:   The prostate was treated to 70 Gy in 28 fractions of 2.5 Gy  Narrative: I spoke with the patient to conduct his routine scheduled 1 month follow up visit via telephone to spare the patient unnecessary potential exposure in the healthcare setting during the current COVID-19 pandemic.  The patient was notified in advance and gave permission to proceed with this visit format.  He tolerated radiation treatment relatively well with only minor urinary irritation and modest fatigue.  He did report increased nocturia x4, frequency and urgency but specifically denied dysuria, gross hematuria, straining to void or incontinence.  He denied any abdominal pain, nausea, vomiting, diarrhea or constipation.                              On review of systems, the patient states that he is doing very well in general.  His LUTS are gradually improving, now with nocturia x2 and some residual frequency and urgency.  He specifically denies dysuria, gross hematuria, straining to void or incontinence.  He reports a healthy appetite and is maintaining his weight.  He denies abdominal pain, nausea, vomiting, diarrhea or constipation.  He does have some residual fatigue but feels that this is gradually improving.  He has noticed more difficulty with obtaining and maintaining erections since the time of diagnosis and is planning to discuss this further with his urologist regarding trying daily Cialis to help with more spontaneous sexual function.  Otherwise, he is pleased with his progress to date.  ALLERGIES:  is  allergic to benazepril hcl and nifedipine er.  Meds: Current Outpatient Medications  Medication Sig Dispense Refill   amLODipine (NORVASC) 10 MG tablet Take 1 tablet (10 mg total) by mouth daily. 30 tablet 1   APPLE CIDER VINEGAR PO Take 2 each by mouth daily. [place]     carvedilol (COREG) 25 MG tablet Take 1 tablet (25 mg total) by mouth 2 (two) times daily with a meal. 60 tablet 1   diclofenac (VOLTAREN) 75 MG EC tablet Take 75 mg by mouth 2 (two) times daily.     fluticasone (FLONASE) 50 MCG/ACT nasal spray Place 1 spray into both nostrils daily as needed for allergies.     rosuvastatin (CRESTOR) 20 MG tablet Take 20 mg by mouth daily.     sildenafil (VIAGRA) 100 MG tablet TAKE ONE TABLET BY MOUTH DAILY AS NEEDED FOR ERECTILE DYSFUNCTION (ALTERNATE WITH TADALAFIL)     tadalafil (CIALIS) 20 MG tablet TAKE ONE TABLET BY MOUTH DAILY AS NEEDED FOR ERECTILE DYSFUNCTION ALTERNATING WITH SILDENAFIL     No current facility-administered medications for this encounter.    Physical Findings:  vitals were not taken for this visit.  Pain Assessment Pain Score: 0-No pain/10 Unable to assess due to telephone follow-up visit format.  Lab Findings: Lab Results  Component Value Date   WBC 14.0 (H) 11/14/2018   HGB 16.3 01/21/2022   HCT 48.0 01/21/2022   MCV 90.1 11/14/2018   PLT 239 11/14/2018  Radiographic Findings: No results found.  Impression/Plan: 1.  66 y.o. gentleman with Stage T1c adenocarcinoma of the prostate with Gleason score of 4+3, and PSA of 4.59. He will continue to follow up with urology for ongoing PSA determinations but does not currently have any scheduled follow-up visits with Dr. Cain Sieve to his knowledge. He understands what to expect with regards to PSA monitoring going forward. I will look forward to following his response to treatment via correspondence with urology, and would be happy to continue to participate in his care if clinically indicated. I talked to the  patient about what to expect in the future, including his risk for erectile dysfunction and rectal bleeding.  He is already experiencing some difficulty obtaining and maintaining erections, since the time of diagnosis, and is planning to address this further with Dr. Cain Sieve regarding trying some daily Cialis for more spontaneous sexual function.  He also inquired about OTC ED medications and testosterone supplements but I discouraged use of either of these and advised that he instead, discuss this further with his urologist.  I encouraged him to call or return to the office if he has any questions regarding his previous radiation or possible radiation side effects. He was comfortable with this plan and will follow up as needed.     Nicholos Johns, PA-C

## 2022-05-21 ENCOUNTER — Encounter: Payer: Self-pay | Admitting: *Deleted

## 2022-06-12 ENCOUNTER — Inpatient Hospital Stay: Payer: 59 | Attending: Adult Health | Admitting: *Deleted

## 2022-06-12 ENCOUNTER — Encounter: Payer: Self-pay | Admitting: *Deleted

## 2022-06-12 DIAGNOSIS — C61 Malignant neoplasm of prostate: Secondary | ICD-10-CM

## 2022-06-12 NOTE — Progress Notes (Signed)
2 Identifiers used for verification purposes. No vitals taken as this was a telephone visit. Pt denies pain and fatigue since treatment has resolved. Pt states he does have urinary urgency mostly in daytime and frequency mainly at night. He says overall he feels like that's getting better. Pt does have concerns about whether he will be able to maintain an erection without taking medication for it. He also was concerned because the sildenafil and tadalafil doesn't always work. I encouraged him to be patient and to give it time being that he completed radiation less than 3 months ago. I also encouraged pt to talk to urologist about this as well. Pt is not exercising currently because prior to the diagnosis of prostate ca, he was in the process of deciding whether to have knee surgery but it was put on hold because of dealing with the cancer. He does take diclofenac for pain. Pt denies smoking tobacco but does use marijuana on a regular basis and drinks occasionally. Last PSA was 0.65 in Sept. Pt will make appt w/ PCP for annual. Last colonoscopy was 03/2021, repeat in 7 years according to report. Vaccination reviewed and updated. SCP reviewed and completed.

## 2023-06-23 ENCOUNTER — Encounter (HOSPITAL_COMMUNITY): Payer: Self-pay | Admitting: Emergency Medicine

## 2023-06-23 ENCOUNTER — Emergency Department (HOSPITAL_COMMUNITY)
Admission: EM | Admit: 2023-06-23 | Discharge: 2023-06-23 | Disposition: A | Discharge status: Home / Self Care | Payer: 59 | Attending: Emergency Medicine | Admitting: Emergency Medicine

## 2023-06-23 DIAGNOSIS — M25561 Pain in right knee: Secondary | ICD-10-CM | POA: Diagnosis present

## 2023-06-23 DIAGNOSIS — I10 Essential (primary) hypertension: Secondary | ICD-10-CM | POA: Insufficient documentation

## 2023-06-23 DIAGNOSIS — Z79899 Other long term (current) drug therapy: Secondary | ICD-10-CM | POA: Diagnosis not present

## 2023-06-23 DIAGNOSIS — M79604 Pain in right leg: Secondary | ICD-10-CM

## 2023-06-23 LAB — CBG MONITORING, ED: Glucose-Capillary: 121 mg/dL — ABNORMAL HIGH (ref 70–99)

## 2023-06-23 MED ORDER — METHOCARBAMOL 500 MG PO TABS
500.0000 mg | ORAL_TABLET | Freq: Once | ORAL | Status: AC
  Administered 2023-06-23: 500 mg via ORAL
  Filled 2023-06-23: qty 1

## 2023-06-23 MED ORDER — HYDROCODONE-ACETAMINOPHEN 5-325 MG PO TABS: 1.0000 | ORAL_TABLET | Freq: Four times a day (QID) | ORAL | 0 refills | Status: DC | PRN

## 2023-06-23 MED ORDER — MORPHINE SULFATE (PF) 4 MG/ML IV SOLN
4.0000 mg | Freq: Once | INTRAVENOUS | Status: AC
  Administered 2023-06-23: 4 mg via INTRAMUSCULAR
  Filled 2023-06-23: qty 1

## 2023-06-23 MED ORDER — HYDROMORPHONE HCL 1 MG/ML IJ SOLN
1.0000 mg | Freq: Once | INTRAMUSCULAR | Status: AC
  Administered 2023-06-23: 1 mg via INTRAMUSCULAR
  Filled 2023-06-23: qty 1

## 2023-06-23 MED ORDER — GABAPENTIN 300 MG PO CAPS
300.0000 mg | ORAL_CAPSULE | Freq: Once | ORAL | Status: AC
Start: 2023-06-23 — End: 2023-06-23
  Administered 2023-06-23: 300 mg via ORAL
  Filled 2023-06-23: qty 1

## 2023-06-23 NOTE — Discharge Instructions (Addendum)
It was a pleasure taking care of you today.  As discussed, you can increase your gabapentin up to 300mg  three times a day. Call your PCP today to see if you can have your labs rechecked to check your kidney function.  I am sending you home with some strong pain medication.  Take as needed for severe pain.  Pain medication can cause drowsiness so do not drive or operate machinery while on the medication.  Please follow-up with orthopedics for further evaluation.  Return to the ER for new or worsening symptoms.

## 2023-06-23 NOTE — ED Triage Notes (Signed)
Pt is seeing Dr. Charlann Boxer for arthritis. He has had a partial replacement years ago and getting ready for full. He is having nerve pain - states feels like blow torch and thousands of needles stabbing him in knee. He has not been able to walk or sleep due to pain. Is taking a steroid taper and 600mg  gabapentin *he was prescribed 300mg . He started these on Monday.

## 2023-06-23 NOTE — ED Provider Notes (Signed)
[place] [facility]GENC[facility] DEPARTMENT AT [facility] Provider Note   CSN: [id number] Arrival date & time: 06/23/23  4098     History  Chief Complaint  Patient presents with   Knee Pain    Robert [name] is a 67 y.o. male with a past medical history significant for hyperlipidemia, hypertension, history of abdominal aortic aneurysm, and PVD who presents to the ED due to right lower extremity pain.  Patient states it feels like a "blowtorch and thousand needles stabbing him in the right lower extremity.  Patient states pain is worsening went from ankle into the foot.  No specific location more diffuse pain.  No injury.  Pain started last week.  Saw Dr. Mickle Asper PA who prescribed him gabapentin and prednisone she has been taking.  Pain better with movement of leg.  Patient has a scheduled appointment with Dr. Charlann Boxer on 10/11 to schedule a right knee replacement.  No injury to right lower extremity.  No fever or chills.  Denies low back pain.  Has a scheduled MRI this Saturday.  Denies abdominal pain.  Denies lower extremity weakness, anesthesia, and bowel/bladder incontinence.  History obtained from patient and past medical records. No interpreter used during encounter.       Home Medications Prior to Admission medications   Medication Sig Start Date End Date Taking? Authorizing Provider  HYDROcodone-acetaminophen (NORCO/VICODIN) 5-325 MG tablet Take 1 tablet by mouth every 6 (six) hours as needed. 06/23/23  Yes Kemi Gell C, PA-C  amLODipine (NORVASC) 10 MG tablet Take 1 tablet (10 mg total) by mouth daily. 11/12/18   Lars Mage, PA-C  carvedilol (COREG) 25 MG tablet Take 1 tablet (25 mg total) by mouth 2 (two) times daily with a meal. 11/11/18   Thomasena Edis, Willa Rough, PA-C  diclofenac (VOLTAREN) 75 MG EC tablet Take 75 mg by mouth 2 (two) times daily. 05/15/20   [provider]  fluticasone (FLONASE) 50 MCG/ACT nasal spray Place 1 spray into both nostrils daily as needed  for allergies. 11/26/20   [provider]  rosuvastatin (CRESTOR) 20 MG tablet Take 20 mg by mouth daily.    [provider]  sildenafil (VIAGRA) 100 MG tablet TAKE ONE TABLET BY MOUTH DAILY AS NEEDED FOR ERECTILE DYSFUNCTION (ALTERNATE WITH TADALAFIL)    [provider]  tadalafil (CIALIS) 20 MG tablet TAKE ONE TABLET BY MOUTH DAILY AS NEEDED FOR ERECTILE DYSFUNCTION ALTERNATING WITH SILDENAFIL    [provider]      Allergies    Benazepril hcl and Nifedipine er    Review of Systems   Review of Systems  Respiratory:  Negative for shortness of breath.   Cardiovascular:  Negative for chest pain.  Gastrointestinal:  Negative for abdominal pain.  Musculoskeletal:  Positive for arthralgias and myalgias. Negative for back pain.  Neurological:  Negative for weakness and numbness.    Physical Exam Updated Vital Signs BP (!) 153/107 (BP Location: Right Arm)   Pulse 78   Temp 98.3 F (36.8 C) (Oral)   Resp 18   SpO2 97%  Physical Exam Vitals and nursing note reviewed.  Constitutional:      General: He is not in acute distress.    Appearance: He is not ill-appearing.  HENT:     Head: Normocephalic.  Eyes:     Pupils: Pupils are equal, round, and reactive to light.  Cardiovascular:     Rate and Rhythm: Normal rate and regular rhythm.     Pulses: Normal  pulses.     Heart sounds: Normal heart sounds. No murmur heard.    No friction rub. No gallop.  Pulmonary:     Effort: Pulmonary effort is normal.     Breath sounds: Normal breath sounds.  Abdominal:     General: Abdomen is flat. There is no distension.     Palpations: Abdomen is soft.     Tenderness: There is no abdominal tenderness. There is no guarding or rebound.     Comments: Abdomen soft, nondistended, nontender to palpation in all quadrants without guarding or peritoneal signs. No rebound.   Musculoskeletal:        General: Normal range of motion.     Cervical back: Neck supple.      Comments: No thoracic or lumbar midline tenderness. No tenderness to right knee or ankle. Full ROM of right ankle. No edema, erythema, or warmth. Able to ambulate without difficulty.   Skin:    General: Skin is warm and dry.  Neurological:     General: No focal deficit present.     Mental Status: He is alert.  Psychiatric:        Mood and Affect: Mood normal.        Behavior: Behavior normal.     ED Results / Procedures / Treatments   Labs (all labs ordered are listed, but only abnormal results are displayed) Labs Reviewed  CBG MONITORING, ED - Abnormal; Notable for the following components:      Result Value   Glucose-Capillary 121 (*)    All other components within normal limits    EKG None  Radiology No results found.  Procedures Procedures    Medications Ordered in ED Medications  morphine (PF) 4 MG/ML injection 4 mg (4 mg Intramuscular Given 06/23/23 0737)  methocarbamol (ROBAXIN) tablet 500 mg (500 mg Oral Given 06/23/23 0737)  HYDROmorphone (DILAUDID) injection 1 mg (1 mg Intramuscular Given 06/23/23 0832)  gabapentin (NEURONTIN) capsule 300 mg (300 mg Oral Given 06/23/23 1610)    ED Course/ Medical Decision Making/ A&P Clinical Course as of 06/23/23 9604  Wed Jun 23, 2023  0758 Glucose-Capillary(!): 121 [CA]    Clinical Course User Index [CA] Mannie Stabile, PA-C                                 Medical Decision Making Amount and/or Complexity of Data Reviewed Independent Historian: spouse Labs:  Decision-making details documented in ED Course.  Risk Prescription drug management.   67 year old male presents to the ED due to right lower extremity pain that he describes as a blow torch and thousands of needle stabbing him in the leg.  Notes pain is from the ankle into the foot.  Saw Dr. Nilsa Nutting PA last week who prescribed him gabapentin and prednisone for suspected neuropathy.  Has a scheduled L-spine MRI on Saturday.  Denies any low back, abdominal  pain, saddle anesthesia, bowel/bladder incontinence, lower extremity numbness/tingling, lower extremity weakness.  No injury.  Upon arrival patient afebrile, not tachycardic or hypoxic.  Patient in no acute distress.  No tenderness throughout right ankle or foot.  Pedal pulses palpable.  Patient able to ambulate without difficulty.  No thoracic or lumbar midline tenderness.  No evidence of infection- low suspicion for septic joint. Of note, patient had a ruptured AAA in 2020.  Patient denies any abdominal pain.  Abdomen soft, nondistended, nontender. Good distal pulses. Low suspicion for complication of  AAA.   8:13 AM Reassessed patient. Patient denies any improvement in pain. IM dilaudid given. Discussed with [place] with pharmacy who notes gabapentin needs to be renally dosed. She states max dose for renal patient is 300mg  TID. No recent labs in system; however patient notes he had labs drawn by orthopedics. He notes his renal function is normal. Will increase Gabapentin to 300mg  TID. Advised patient to follow-up with PCP if he needs to increase anymore to have labs rechecked to check renal function.   Discussed with Dr. Estell Harpin who evaluated patient at bedside who agrees with assessment and plan. Does not feel this is related to AAA.   8:59 AM reassessed patient.  Patient notes a significant improvement in pain.  Will discharge patient with short course of pain medication.  Low suspicion for cauda equina or central cord compression.  Patient able to ambulate without difficulty.  Advised patient to follow-up with orthopedics for further evaluation. Patient stable for discharge. Strict ED precautions discussed with patient. Patient states understanding and agrees to plan. Patient discharged home in no acute distress and stable vitals  Lives at home History AAA Has PCP       Final Clinical Impression(s) / ED Diagnoses Final diagnoses:  Right leg pain    Rx / DC Orders ED Discharge Orders           Ordered    HYDROcodone-acetaminophen (NORCO/VICODIN) 5-325 MG tablet  Every 6 hours PRN        06/23/23 0902              Mannie Stabile, PA-C 06/23/23 1610    Bethann Berkshire, MD 06/24/23 [phone]

## 2023-07-09 NOTE — Progress Notes (Signed)
Fax received from [facility] on 06/24/23 for medical clearance/medication hold for conversion to right total knee arthroplasty to be signed by Shela Commons. Verl Blalock.  Provider signed on 07/08/23, scanned into pt's chart, media routed via MyChart to sender on 07/09/23.

## 2023-08-03 NOTE — Progress Notes (Signed)
COVID Vaccine Completed: yes  Date of COVID positive in last 90 days:  PCP - Johny Blamer, MD Cardiologist - Marca Ancona, MD  Chest x-ray -  EKG -  Stress Test - 2005 ECHO - 2020 Epic Cardiac Cath -  Pacemaker/ICD device last checked: Spinal Cord Stimulator:  Bowel Prep -   Sleep Study -  CPAP -   Fasting Blood Sugar -  Checks Blood Sugar _____ times a day  Last dose of GLP1 agonist-  N/A GLP1 instructions:  Hold 7 days before surgery    Last dose of SGLT-2 inhibitors-  N/A SGLT-2 instructions:  Hold 3 days before surgery    Blood Thinner Instructions:  Time Aspirin Instructions: Last Dose:  Activity level:  Can go up a flight of stairs and perform activities of daily living without stopping and without symptoms of chest pain or shortness of breath.  Able to exercise without symptoms  Unable to go up a flight of stairs without symptoms of     Anesthesia review: AAA, HTN, cardiomyopathy, fatty liver, PVD  Patient denies shortness of breath, fever, cough and chest pain at PAT appointment  Patient verbalized understanding of instructions that were given to them at the PAT appointment. Patient was also instructed that they will need to review over the PAT instructions again at home before surgery.

## 2023-08-03 NOTE — Patient Instructions (Signed)
SURGICAL WAITING ROOM VISITATION  Patients having surgery or a procedure may have no more than 2 support people in the waiting area - these visitors may rotate.    Children under the age of 22 must have an adult with them who is not the patient.  Due to an increase in RSV and influenza rates and associated hospitalizations, children ages 50 and under may not visit patients in [facility] hospitals.  If the patient needs to stay at the hospital during part of their recovery, the visitor guidelines for inpatient rooms apply. Pre-op nurse will coordinate an appropriate time for 1 support person to accompany patient in pre-op.  This support person may not rotate.    Please refer to the [facility] website for the visitor guidelines for Inpatients (after your surgery is over and you are in a regular room).    Your procedure is scheduled on: 08/12/23   Report to [facility] Main Entrance    Report to admitting at 6:10 AM   Call this number if you have problems the morning of surgery [phone]   Do not eat food :After Midnight.   After Midnight you may have the following liquids until 5:40 AM DAY OF SURGERY  Water Non-Citrus Juices (without pulp, NO RED-Apple, White grape, White cranberry) Black Coffee (NO MILK/CREAM OR CREAMERS, sugar ok)  Clear Tea (NO MILK/CREAM OR CREAMERS, sugar ok) regular and decaf                             Plain Jell-O (NO RED)                                           Fruit ices (not with fruit pulp, NO RED)                                     Popsicles (NO RED)                                                               Sports drinks like Gatorade (NO RED)                 The day of surgery:  Drink ONE (1) Pre-Surgery Clear Ensure at 5:40 AM the morning of surgery. Drink in one sitting. Do not sip.  This drink was given to you during your hospital  pre-op appointment visit. Nothing else to drink after completing the  Pre-Surgery Clear Ensure  or G2.          If you have questions, please contact your surgeon's office.   FOLLOW BOWEL PREP AND ANY ADDITIONAL PRE OP INSTRUCTIONS YOU RECEIVED FROM YOUR SURGEON'S OFFICE!!!     Oral Hygiene is also important to reduce your risk of infection.                                    Remember - BRUSH YOUR TEETH THE MORNING OF SURGERY WITH YOUR REGULAR TOOTHPASTE  DENTURES WILL BE REMOVED PRIOR TO SURGERY PLEASE DO NOT APPLY "Poly grip" OR ADHESIVES!!!   Stop all vitamins and herbal supplements 7 days before surgery.   Take these medicines the morning of surgery with A SIP OF WATER: Amlodipine, Carvedilol, Lyrica, Rosuvastatin, Flonase                              You may not have any metal on your body including jewelry, and body piercing             Do not wear lotions, powders, cologne, or deodorant              Men may shave face and neck.   Do not bring valuables to the hospital. [facility] IS NOT             RESPONSIBLE   FOR VALUABLES.   Contacts, glasses, dentures or bridgework may not be worn into surgery.   Bring small overnight bag day of surgery.   DO NOT BRING YOUR HOME MEDICATIONS TO THE HOSPITAL. PHARMACY WILL DISPENSE MEDICATIONS LISTED ON YOUR MEDICATION LIST TO YOU DURING YOUR ADMISSION IN THE HOSPITAL!              Please read over the following fact sheets you were given: IF YOU HAVE QUESTIONS ABOUT YOUR PRE-OP INSTRUCTIONS PLEASE CALL [name]   If you received a COVID test during your pre-op visit  it is requested that you wear a mask when out in public, stay away from anyone that may not be feeling well and notify your surgeon if you develop symptoms. If you test positive for Covid or have been in contact with anyone that has tested positive in the last 10 days please notify you surgeon.      Pre-operative 5 CHG Bath Instructions   You can play a key role in reducing the risk of infection after surgery. Your skin needs to be as free of germs  as possible. You can reduce the number of germs on your skin by washing with CHG (chlorhexidine gluconate) soap before surgery. CHG is an antiseptic soap that kills germs and continues to kill germs even after washing.   DO NOT use if you have an allergy to chlorhexidine/CHG or antibacterial soaps. If your skin becomes reddened or irritated, stop using the CHG and notify one of our RNs at [phone].   Please shower with the CHG soap starting 4 days before surgery using the following schedule:     Please keep in mind the following:  DO NOT shave, including legs and underarms, starting the day of your first shower.   You may shave your face at any point before/day of surgery.  Place clean sheets on your bed the day you start using CHG soap. Use a clean washcloth (not used since being washed) for each shower. DO NOT sleep with pets once you start using the CHG.   CHG Shower Instructions:  If you choose to wash your hair and private area, wash first with your normal shampoo/soap.  After you use shampoo/soap, rinse your hair and body thoroughly to remove shampoo/soap residue.  Turn the water OFF and apply about 3 tablespoons (45 ml) of CHG soap to a CLEAN washcloth.  Apply CHG soap ONLY FROM YOUR NECK DOWN TO YOUR TOES (washing for 3-5 minutes)  DO NOT use CHG soap on face, private areas, open wounds, or sores.  Pay special  attention to the area where your surgery is being performed.  If you are having back surgery, having someone wash your back for you may be helpful. Wait 2 minutes after CHG soap is applied, then you may rinse off the CHG soap.  Pat dry with a clean towel  Put on clean clothes/pajamas   If you choose to wear lotion, please use ONLY the CHG-compatible lotions on the back of this paper.     Additional instructions for the day of surgery: DO NOT APPLY any lotions, deodorants, cologne, or perfumes.   Put on clean/comfortable clothes.  Brush your teeth.  Ask your nurse  before applying any prescription medications to the skin.      CHG Compatible Lotions   Aveeno Moisturizing lotion  Cetaphil Moisturizing Cream  Cetaphil Moisturizing Lotion  Clairol Herbal Essence Moisturizing Lotion, Dry Skin  Clairol Herbal Essence Moisturizing Lotion, Extra Dry Skin  Clairol Herbal Essence Moisturizing Lotion, Normal Skin  Curel Age Defying Therapeutic Moisturizing Lotion with Alpha Hydroxy  Curel Extreme Care Body Lotion  Curel Soothing Hands Moisturizing Hand Lotion  Curel Therapeutic Moisturizing Cream, Fragrance-Free  Curel Therapeutic Moisturizing Lotion, Fragrance-Free  Curel Therapeutic Moisturizing Lotion, Original Formula  Eucerin Daily Replenishing Lotion  Eucerin Dry Skin Therapy Plus Alpha Hydroxy Crme  Eucerin Dry Skin Therapy Plus Alpha Hydroxy Lotion  Eucerin Original Crme  Eucerin Original Lotion  Eucerin Plus Crme Eucerin Plus Lotion  Eucerin TriLipid Replenishing Lotion  Keri Anti-Bacterial Hand Lotion  Keri Deep Conditioning Original Lotion Dry Skin Formula Softly Scented  Keri Deep Conditioning Original Lotion, Fragrance Free Sensitive Skin Formula  Keri Lotion Fast Absorbing Fragrance Free Sensitive Skin Formula  Keri Lotion Fast Absorbing Softly Scented Dry Skin Formula  Keri Original Lotion  Keri Skin Renewal Lotion Keri Silky Smooth Lotion  Keri Silky Smooth Sensitive Skin Lotion  Nivea Body Creamy Conditioning Oil  Nivea Body Extra Enriched Lotion  Nivea Body Original Lotion  Nivea Body Sheer Moisturizing Lotion Nivea Crme  Nivea Skin Firming Lotion  NutraDerm 30 Skin Lotion  NutraDerm Skin Lotion  NutraDerm Therapeutic Skin Cream  NutraDerm Therapeutic Skin Lotion  ProShield Protective Hand Cream  Provon moisturizing lotion   Incentive Spirometer  An incentive spirometer is a tool that can help keep your lungs clear and active. This tool measures how well you are filling your lungs with each breath. Taking long deep  breaths may help reverse or decrease the chance of developing breathing (pulmonary) problems (especially infection) following: A long period of time when you are unable to move or be active. BEFORE THE PROCEDURE  If the spirometer includes an indicator to show your best effort, your nurse or respiratory therapist will set it to a desired goal. If possible, sit up straight or lean slightly forward. Try not to slouch. Hold the incentive spirometer in an upright position. INSTRUCTIONS FOR USE  Sit on the edge of your bed if possible, or sit up as far as you can in bed or on a chair. Hold the incentive spirometer in an upright position. Breathe out normally. Place the mouthpiece in your mouth and seal your lips tightly around it. Breathe in slowly and as deeply as possible, raising the piston or the ball toward the top of the column. Hold your breath for 3-5 seconds or for as long as possible. Allow the piston or ball to fall to the bottom of the column. Remove the mouthpiece from your mouth and breathe out normally. Rest for a few seconds  and repeat Steps 1 through 7 at least 10 times every 1-2 hours when you are awake. Take your time and take a few normal breaths between deep breaths. The spirometer may include an indicator to show your best effort. Use the indicator as a goal to work toward during each repetition. After each set of 10 deep breaths, practice coughing to be sure your lungs are clear. If you have an incision (the cut made at the time of surgery), support your incision when coughing by placing a pillow or rolled up towels firmly against it. Once you are able to get out of bed, walk around indoors and cough well. You may stop using the incentive spirometer when instructed by your caregiver.  RISKS AND COMPLICATIONS Take your time so you do not get dizzy or light-headed. If you are in pain, you may need to take or ask for pain medication before doing incentive spirometry. It is harder  to take a deep breath if you are having pain. AFTER USE Rest and breathe slowly and easily. It can be helpful to keep track of a log of your progress. Your caregiver can provide you with a simple table to help with this. If you are using the spirometer at home, follow these instructions: SEEK MEDICAL CARE IF:  You are having difficultly using the spirometer. You have trouble using the spirometer as often as instructed. Your pain medication is not giving enough relief while using the spirometer. You develop fever of 100.5 F (38.1 C) or higher. SEEK IMMEDIATE MEDICAL CARE IF:  You cough up bloody sputum that had not been present before. You develop fever of 102 F (38.9 C) or greater. You develop worsening pain at or near the incision site. MAKE SURE YOU:  Understand these instructions. Will watch your condition. Will get help right away if you are not doing well or get worse. Document Released: 01/18/2007 Document Revised: 11/30/2011 Document Reviewed: 03/21/2007 Exit[employer] Patient Information 2014 [employer], [place].   ________________________________________________________________________ WHAT IS A BLOOD TRANSFUSION? Blood Transfusion Information  A transfusion is the replacement of blood or some of its parts. Blood is made up of multiple cells which provide different functions. Red blood cells carry oxygen and are used for blood loss replacement. White blood cells fight against infection. Platelets control bleeding. Plasma helps clot blood. Other blood products are available for specialized needs, such as hemophilia or other clotting disorders. BEFORE THE TRANSFUSION  Who gives blood for transfusions?  Healthy volunteers who are fully evaluated to make sure their blood is safe. This is blood bank blood. Transfusion therapy is the safest it has ever been in the practice of medicine. Before blood is taken from a donor, a complete history is taken to make sure that person has no  history of diseases nor engages in risky social behavior (examples are intravenous drug use or sexual activity with multiple partners). The donor's travel history is screened to minimize risk of transmitting infections, such as malaria. The donated blood is tested for signs of infectious diseases, such as HIV and hepatitis. The blood is then tested to be sure it is compatible with you in order to minimize the chance of a transfusion reaction. If you or a relative donates blood, this is often done in anticipation of surgery and is not appropriate for emergency situations. It takes many days to process the donated blood. RISKS AND COMPLICATIONS Although transfusion therapy is very safe and saves many lives, the main dangers of transfusion include:  Getting  an infectious disease. Developing a transfusion reaction. This is an allergic reaction to something in the blood you were given. Every precaution is taken to prevent this. The decision to have a blood transfusion has been considered carefully by your caregiver before blood is given. Blood is not given unless the benefits outweigh the risks. AFTER THE TRANSFUSION Right after receiving a blood transfusion, you will usually feel much better and more energetic. This is especially true if your red blood cells have gotten low (anemic). The transfusion raises the level of the red blood cells which carry oxygen, and this usually causes an energy increase. The nurse administering the transfusion will monitor you carefully for complications. HOME CARE INSTRUCTIONS  No special instructions are needed after a transfusion. You may find your energy is better. Speak with your caregiver about any limitations on activity for underlying diseases you may have. SEEK MEDICAL CARE IF:  Your condition is not improving after your transfusion. You develop redness or irritation at the intravenous (IV) site. SEEK IMMEDIATE MEDICAL CARE IF:  Any of the following symptoms occur  over the next 12 hours: Shaking chills. You have a temperature by mouth above 102 F (38.9 C), not controlled by medicine. Chest, back, or muscle pain. People around you feel you are not acting correctly or are confused. Shortness of breath or difficulty breathing. Dizziness and fainting. You get a rash or develop hives. You have a decrease in urine output. Your urine turns a dark color or changes to pink, red, or brown. Any of the following symptoms occur over the next 10 days: You have a temperature by mouth above 102 F (38.9 C), not controlled by medicine. Shortness of breath. Weakness after normal activity. The white part of the eye turns yellow (jaundice). You have a decrease in the amount of urine or are urinating less often. Your urine turns a dark color or changes to pink, red, or brown. Document Released: 09/04/2000 Document Revised: 11/30/2011 Document Reviewed: 04/23/2008 [facility] Patient Information 2014 [place], [place].  _______________________________________________________________________

## 2023-08-04 ENCOUNTER — Other Ambulatory Visit: Payer: Self-pay

## 2023-08-04 ENCOUNTER — Encounter (HOSPITAL_COMMUNITY): Payer: Self-pay

## 2023-08-04 ENCOUNTER — Encounter (HOSPITAL_COMMUNITY)
Admission: RE | Admit: 2023-08-04 | Discharge: 2023-08-04 | Disposition: A | Discharge status: Home / Self Care | Payer: 59 | Source: Ambulatory Visit | Attending: Orthopedic Surgery | Admitting: Orthopedic Surgery

## 2023-08-04 VITALS — BP 143/92 | HR 75 | Temp 99.1°F | Resp 16 | Ht 76.0 in | Wt 301.0 lb

## 2023-08-04 DIAGNOSIS — I10 Essential (primary) hypertension: Secondary | ICD-10-CM | POA: Diagnosis not present

## 2023-08-04 DIAGNOSIS — Z01818 Encounter for other preprocedural examination: Secondary | ICD-10-CM

## 2023-08-04 HISTORY — DX: Malignant (primary) neoplasm, unspecified: C80.1

## 2023-08-04 LAB — SURGICAL PCR SCREEN
MRSA, PCR: NEGATIVE
Staphylococcus aureus: POSITIVE — AB

## 2023-08-05 ENCOUNTER — Encounter (HOSPITAL_COMMUNITY): Payer: Self-pay

## 2023-08-05 NOTE — Progress Notes (Signed)
Case: [id number] Date/Time: 08/12/23 1001   Procedure: CONVERSION TO TOTAL KNEE (Right: Knee)   Anesthesia type: Spinal   Pre-op diagnosis: Failed right partial knee replacement with progression of osteoarthritis   Location: [facility] ROOM 09 / [facility]   Surgeons: Durene Romans, MD       DISCUSSION: Robert [name] is a 67 yo male who presents to PAT prior to surgery above. PMH of HTN, AAA rupture s/p repair (10/2018), fibromuscular dysplasia of renal arteries s/p angioplasty (2006), hx of prostate cancer s/p seed implant (01/2022).  Patient previously followed with Vascular due to AAA. Had a rupture in 2020 and is s/p repair. Hospital course was complicated by cardiac arrest, acute systolic CHF with EF 20-25%, and ARF. He was not taken to the cath lab due to critical condition and it was thought to be due to hypoperfusion/shock rather than CAD. Prior cath in 2013 did not show significant CAD. Repeat Echo showed recovered EF on 11/11/18. He was supposed to f/u with Cardiology outpatient but never did. His renal function completely recovered as well.   He last saw Vascular on 05/16/2020. CTA of abd/pelvis appeared intact. Advised to f/u in 5 years for repeat scan (~2026). Clearance received on 07/08/23 (paper copy in chart).  Patient is followed by PCP for chronic medical issues. Last seen on 01/22/23. All medical issues appear to be stable. Medical clearance received on 06/24/23 (paper copy in chart).   VS: BP (!) 143/92   Pulse 75   Temp 37.3 C (Oral)   Resp 16   Ht 6\' 4"  (1.93 m)   Wt (!) 136.5 kg   SpO2 95%   BMI 36.64 kg/m   PROVIDERS: Noberto Retort, MD   LABS: Labs reviewed: Acceptable for surgery. (all labs ordered are listed, but only abnormal results are displayed)  Labs Reviewed  SURGICAL PCR SCREEN - Abnormal; Notable for the following components:      Result Value   Staphylococcus aureus POSITIVE (*)    All other components within normal limits  TYPE AND SCREEN      IMAGES:  CTA Abd/Pelvis 05/10/2020:  IMPRESSION: 1. Tube graft repair of infrarenal aorta without evident complication. 2. Interval decrease in periaortic hematoma since prior study, with only minimal streaky changes remaining around the distal aspect of the graft. 3. No acute findings.  EKG 08/04/23  Normal sinus rhythm, rate 77 Minimal voltage criteria for LVH, may be normal variant ( R in aVL ) Nonspecific ST abnormality   CV:  Echo 11/11/2018:  IMPRESSIONS    1. The left ventricle has normal systolic function, with an ejection fraction of 55-60%. There is moderately increased left ventricular wall thickness. Left ventricular diastology could not be evaluated due to indeterminent diastolic function. No evidence of left ventricular regional wall motion abnormalities.  2. The right ventricle has mildly reduced systolic function. The cavity was normal.  Cardiac Catheterization 05/19/2012  Coronary Angiographic Data: Left Main:  Large caliber vessel, angiographically normal appearance Left Anterior Descending (LAD):  Large caliber vessel extending to the apex, significant toruosity, with mild luminal irregularities Circumflex (LCx):  Large caliber vessel with tortuosity and no significant stenoses. 1st obtuse marginal:  Angiographically normal 2nd obtuse marginal:  Small vessel, no significant obstruction Right Coronary Artery: Large, dominant vessel with no significant angiographic stenosis. The ostium which was previously reported to have mild to moderate disease, appears to be of normal caliber and not stenotic. posterior descending artery: no significant obstruction posterior lateral branch:  No  significant obstruction Impression: 1.  No significant obstructive CAD. 2.  Large coronary arteries with right dominant circulation. 3.  False positive stress test.   Past Medical History:  Diagnosis Date   AAA (abdominal aortic aneurysm, ruptured) (HCC)    Cancer  (HCC)    prostate   Fibromuscular dysplasia of renal artery (HCC)    s/p angioplasty to both renal arteries in 2006   History of abdominal aortic aneurysm    History of cardiac catheterization    LHC in 2013:  no sig CAD   Hyperlipidemia    Hypertension    Peripheral vascular disease (HCC)     Past Surgical History:  Procedure Laterality Date   ABDOMINAL AORTIC ANEURYSM REPAIR N/A 11/05/2018   Procedure: ANEURYSM ABDOMINAL AORTIC REPAIR WITH HEMASHIELD GOLD VELOUR VASCULAR GRAFT;  Surgeon: Sherren Kerns, MD;  Location: [facility];  Service: Vascular;  Laterality: N/A;   CARDIAC SURGERY  09/21/2004   stent placement   COLONOSCOPY WITH PROPOFOL N/A 04/11/2021   Procedure: COLONOSCOPY WITH PROPOFOL;  Surgeon: Jeani Hawking, MD;  [facility]: [facility];  Service: Endoscopy;  Laterality: N/A;   GOLD SEED IMPLANT N/A 01/21/2022   Procedure: GOLD SEED IMPLANT;  Surgeon: Despina Arias, MD;  Location: [facility];  Service: Urology;  Laterality: N/A;   HERNIA REPAIR     HIP ARTHROPLASTY Right    INSERTION OF MESH  08/31/2012   Procedure: INSERTION OF MESH;  Surgeon: Wilmon Arms. Corliss Skains, MD;  Location: [facility];  Service: General;  Laterality: N/A;   KNEE SURGERY  2009/2010   right   KNEE SURGERY  09/21/2008   right knee partial replacement   LEFT HEART CATHETERIZATION WITH CORONARY ANGIOGRAM N/A 05/19/2012   Procedure: LEFT HEART CATHETERIZATION WITH CORONARY ANGIOGRAM;  Surgeon: Chrystie Nose, MD;  Location: [facility] CATH LAB;  Service: Cardiovascular;  Laterality: N/A;   POLYPECTOMY  04/11/2021   Procedure: POLYPECTOMY;  Surgeon: Jeani Hawking, MD;  [facility];  Service: Endoscopy;;   SPACE OAR INSTILLATION N/A 01/21/2022   Procedure: SPACE OAR INSTILLATION;  Surgeon: Despina Arias, MD;  Location: [facility];  Service: Urology;  Laterality: N/A;   UMBILICAL HERNIA REPAIR  08/31/2012   Procedure: HERNIA REPAIR UMBILICAL ADULT;   Surgeon: Wilmon Arms. Tsuei, MD;  Location: [facility];  Service: General;  Laterality: N/A;  umbilical hernia repair with mesh    MEDICATIONS:  amLODipine (NORVASC) 10 MG tablet   carvedilol (COREG) 25 MG tablet   diclofenac (VOLTAREN) 75 MG EC tablet   fluticasone (FLONASE) 50 MCG/ACT nasal spray   halobetasol (ULTRAVATE) 0.05 % ointment   pregabalin (LYRICA) 75 MG capsule   rosuvastatin (CRESTOR) 20 MG tablet   sildenafil (VIAGRA) 100 MG tablet   tadalafil (CIALIS) 20 MG tablet   No current facility-administered medications for this encounter.    Marcille Blanco [facility] Short Stay/Anesthesiology [facility] Phone [phone] 08/05/2023 11:27 AM

## 2023-08-05 NOTE — Progress Notes (Signed)
[facility]+ results routed to Dr. Olin. 

## 2023-08-11 ENCOUNTER — Encounter (HOSPITAL_COMMUNITY): Payer: Self-pay | Admitting: Orthopedic Surgery

## 2023-08-12 ENCOUNTER — Other Ambulatory Visit: Payer: Self-pay

## 2023-08-12 ENCOUNTER — Encounter (HOSPITAL_COMMUNITY): Payer: Self-pay | Admitting: Orthopedic Surgery

## 2023-08-12 ENCOUNTER — Ambulatory Visit (HOSPITAL_COMMUNITY): Payer: 59 | Admitting: Anesthesiology

## 2023-08-12 ENCOUNTER — Ambulatory Visit (HOSPITAL_COMMUNITY): Payer: 59 | Admitting: Medical

## 2023-08-12 ENCOUNTER — Encounter (HOSPITAL_COMMUNITY)
Admission: RE | Disposition: A | Discharge status: Home / Self Care | Payer: Self-pay | Source: Ambulatory Visit | Attending: Orthopedic Surgery

## 2023-08-12 ENCOUNTER — Observation Stay (HOSPITAL_COMMUNITY)
Admission: RE | Admit: 2023-08-12 | Discharge: 2023-08-13 | Disposition: A | Discharge status: Home / Self Care | Payer: 59 | Source: Ambulatory Visit | Attending: Orthopedic Surgery | Admitting: Orthopedic Surgery

## 2023-08-12 DIAGNOSIS — Z79899 Other long term (current) drug therapy: Secondary | ICD-10-CM | POA: Insufficient documentation

## 2023-08-12 DIAGNOSIS — I10 Essential (primary) hypertension: Secondary | ICD-10-CM | POA: Diagnosis not present

## 2023-08-12 DIAGNOSIS — Z8546 Personal history of malignant neoplasm of prostate: Secondary | ICD-10-CM | POA: Insufficient documentation

## 2023-08-12 DIAGNOSIS — Z96651 Presence of right artificial knee joint: Principal | ICD-10-CM

## 2023-08-12 DIAGNOSIS — Z96641 Presence of right artificial hip joint: Secondary | ICD-10-CM | POA: Diagnosis not present

## 2023-08-12 DIAGNOSIS — M1711 Unilateral primary osteoarthritis, right knee: Principal | ICD-10-CM | POA: Insufficient documentation

## 2023-08-12 HISTORY — PX: CONVERSION TO TOTAL KNEE: SHX5785

## 2023-08-12 LAB — TYPE AND SCREEN
ABO/RH(D): O POS
Antibody Screen: NEGATIVE

## 2023-08-12 SURGERY — CONVERSION, ARTHROPLASTY, KNEE, PARTIAL, TO TOTAL KNEE ARTHROPLASTY
Anesthesia: Spinal | Site: Knee | Laterality: Right

## 2023-08-12 MED ORDER — METHOCARBAMOL 500 MG PO TABS: 500.0000 mg | ORAL_TABLET | Freq: Four times a day (QID) | ORAL | Status: DC | PRN

## 2023-08-12 MED ORDER — TRANEXAMIC ACID-NACL 1000-0.7 MG/100ML-% IV SOLN
1000.0000 mg | Freq: Once | INTRAVENOUS | Status: AC
  Administered 2023-08-12: 1000 mg via INTRAVENOUS
  Filled 2023-08-12: qty 100

## 2023-08-12 MED ORDER — TRANEXAMIC ACID-NACL 1000-0.7 MG/100ML-% IV SOLN
1000.0000 mg | INTRAVENOUS | Status: AC
  Administered 2023-08-12: 1000 mg via INTRAVENOUS
  Filled 2023-08-12: qty 100

## 2023-08-12 MED ORDER — SODIUM CHLORIDE (PF) 0.9 % IJ SOLN
INTRAMUSCULAR | Status: DC | PRN
  Administered 2023-08-12: 61 mL

## 2023-08-12 MED ORDER — PROPOFOL 10 MG/ML IV BOLUS
INTRAVENOUS | Status: DC | PRN
  Administered 2023-08-12: 20 mg via INTRAVENOUS

## 2023-08-12 MED ORDER — DEXAMETHASONE SODIUM PHOSPHATE 10 MG/ML IJ SOLN
10.0000 mg | Freq: Once | INTRAMUSCULAR | Status: AC
  Administered 2023-08-13: 10 mg via INTRAVENOUS
  Filled 2023-08-12: qty 1

## 2023-08-12 MED ORDER — PROPOFOL 500 MG/50ML IV EMUL
INTRAVENOUS | Status: DC | PRN
  Administered 2023-08-12: 100 ug/kg/min via INTRAVENOUS

## 2023-08-12 MED ORDER — SODIUM CHLORIDE 0.9% FLUSH: 10.0000 mL | Freq: Two times a day (BID) | INTRAVENOUS | Status: DC

## 2023-08-12 MED ORDER — ACETAMINOPHEN 500 MG PO TABS
1000.0000 mg | ORAL_TABLET | Freq: Four times a day (QID) | ORAL | Status: DC
  Administered 2023-08-12 – 2023-08-13 (×3): 1000 mg via ORAL
  Filled 2023-08-12 (×3): qty 2

## 2023-08-12 MED ORDER — LACTATED RINGERS IV SOLN: INTRAVENOUS | Status: DC

## 2023-08-12 MED ORDER — ONDANSETRON HCL 4 MG/2ML IJ SOLN: 4.0000 mg | Freq: Four times a day (QID) | INTRAMUSCULAR | Status: DC | PRN

## 2023-08-12 MED ORDER — AMLODIPINE BESYLATE 10 MG PO TABS
10.0000 mg | ORAL_TABLET | Freq: Every day | ORAL | Status: DC
  Administered 2023-08-13: 10 mg via ORAL
  Filled 2023-08-12: qty 1

## 2023-08-12 MED ORDER — DIPHENHYDRAMINE HCL 12.5 MG/5ML PO ELIX: 12.5000 mg | ORAL_SOLUTION | ORAL | Status: DC | PRN

## 2023-08-12 MED ORDER — BUPIVACAINE-EPINEPHRINE (PF) 0.5% -1:200000 IJ SOLN
INTRAMUSCULAR | Status: DC | PRN
  Administered 2023-08-12: 20 mL via PERINEURAL

## 2023-08-12 MED ORDER — ORAL CARE MOUTH RINSE: 15.0000 mL | Freq: Once | OROMUCOSAL | Status: AC

## 2023-08-12 MED ORDER — BUPIVACAINE IN DEXTROSE 0.75-8.25 % IT SOLN
INTRATHECAL | Status: DC | PRN
  Administered 2023-08-12: 1.6 mL via INTRATHECAL

## 2023-08-12 MED ORDER — POVIDONE-IODINE 10 % EX SWAB
2.0000 | Freq: Once | CUTANEOUS | Status: AC
  Administered 2023-08-12: 2 via TOPICAL

## 2023-08-12 MED ORDER — DEXAMETHASONE SODIUM PHOSPHATE 10 MG/ML IJ SOLN
INTRAMUSCULAR | Status: DC | PRN
  Administered 2023-08-12: 10 mg via INTRAVENOUS

## 2023-08-12 MED ORDER — ASPIRIN 81 MG PO CHEW
81.0000 mg | CHEWABLE_TABLET | Freq: Two times a day (BID) | ORAL | Status: DC
Start: 2023-08-12 — End: 2023-08-13
  Administered 2023-08-12 – 2023-08-13 (×2): 81 mg via ORAL
  Filled 2023-08-12 (×2): qty 1

## 2023-08-12 MED ORDER — ALUM & MAG HYDROXIDE-SIMETH 200-200-20 MG/5ML PO SUSP
30.0000 mL | ORAL | Status: DC | PRN
Start: 2023-08-12 — End: 2023-08-13

## 2023-08-12 MED ORDER — FLUTICASONE PROPIONATE 50 MCG/ACT NA SUSP: 1.0000 | Freq: Every day | NASAL | Status: DC | PRN

## 2023-08-12 MED ORDER — CARVEDILOL 25 MG PO TABS
25.0000 mg | ORAL_TABLET | Freq: Two times a day (BID) | ORAL | Status: DC
  Administered 2023-08-12 – 2023-08-13 (×2): 25 mg via ORAL
  Filled 2023-08-12 (×2): qty 1

## 2023-08-12 MED ORDER — CEFAZOLIN SODIUM-DEXTROSE 2-4 GM/100ML-% IV SOLN
2.0000 g | Freq: Four times a day (QID) | INTRAVENOUS | Status: AC
  Administered 2023-08-12 (×2): 2 g via INTRAVENOUS
  Filled 2023-08-12 (×2): qty 100

## 2023-08-12 MED ORDER — LIDOCAINE 2% (20 MG/ML) 5 ML SYRINGE
INTRAMUSCULAR | Status: DC | PRN
  Administered 2023-08-12: 40 mg via INTRAVENOUS

## 2023-08-12 MED ORDER — METOCLOPRAMIDE HCL 5 MG/ML IJ SOLN: 5.0000 mg | Freq: Three times a day (TID) | INTRAMUSCULAR | Status: DC | PRN

## 2023-08-12 MED ORDER — CHLORHEXIDINE GLUCONATE 0.12 % MT SOLN
15.0000 mL | Freq: Once | OROMUCOSAL | Status: AC
Start: 2023-08-12 — End: 2023-08-12
  Administered 2023-08-12: 15 mL via OROMUCOSAL

## 2023-08-12 MED ORDER — HYDROMORPHONE HCL 1 MG/ML IJ SOLN: 0.5000 mg | INTRAMUSCULAR | Status: DC | PRN

## 2023-08-12 MED ORDER — KETOROLAC TROMETHAMINE 30 MG/ML IJ SOLN
INTRAMUSCULAR | Status: AC
  Filled 2023-08-12: qty 1

## 2023-08-12 MED ORDER — ONDANSETRON HCL 4 MG/2ML IJ SOLN
INTRAMUSCULAR | Status: DC | PRN
  Administered 2023-08-12: 4 mg via INTRAVENOUS

## 2023-08-12 MED ORDER — BISACODYL 10 MG RE SUPP: 10.0000 mg | Freq: Every day | RECTAL | Status: DC | PRN

## 2023-08-12 MED ORDER — SODIUM CHLORIDE (PF) 0.9 % IJ SOLN
INTRAMUSCULAR | Status: AC
  Filled 2023-08-12: qty 30

## 2023-08-12 MED ORDER — PREGABALIN 75 MG PO CAPS
75.0000 mg | ORAL_CAPSULE | Freq: Two times a day (BID) | ORAL | Status: DC
  Administered 2023-08-12 – 2023-08-13 (×2): 75 mg via ORAL
  Filled 2023-08-12 (×2): qty 1

## 2023-08-12 MED ORDER — PHENOL 1.4 % MT LIQD: 1.0000 | OROMUCOSAL | Status: DC | PRN

## 2023-08-12 MED ORDER — FENTANYL CITRATE PF 50 MCG/ML IJ SOSY
PREFILLED_SYRINGE | INTRAMUSCULAR | Status: AC
  Administered 2023-08-12: 100 ug via INTRAVENOUS
  Filled 2023-08-12: qty 1

## 2023-08-12 MED ORDER — FENTANYL CITRATE PF 50 MCG/ML IJ SOSY: 100.0000 ug | PREFILLED_SYRINGE | Freq: Once | INTRAMUSCULAR | Status: AC

## 2023-08-12 MED ORDER — PROPOFOL 1000 MG/100ML IV EMUL
INTRAVENOUS | Status: AC
  Filled 2023-08-12: qty 100

## 2023-08-12 MED ORDER — HYDROMORPHONE HCL 1 MG/ML IJ SOLN: 0.2500 mg | INTRAMUSCULAR | Status: DC | PRN

## 2023-08-12 MED ORDER — MENTHOL 3 MG MT LOZG: 1.0000 | LOZENGE | OROMUCOSAL | Status: DC | PRN

## 2023-08-12 MED ORDER — POLYETHYLENE GLYCOL 3350 17 G PO PACK: 17.0000 g | PACK | Freq: Two times a day (BID) | ORAL | Status: DC

## 2023-08-12 MED ORDER — ONDANSETRON HCL 4 MG PO TABS
4.0000 mg | ORAL_TABLET | Freq: Four times a day (QID) | ORAL | Status: DC | PRN
Start: 2023-08-12 — End: 2023-08-13

## 2023-08-12 MED ORDER — MIDAZOLAM HCL 2 MG/2ML IJ SOLN: 1.0000 mg | Freq: Once | INTRAMUSCULAR | Status: AC

## 2023-08-12 MED ORDER — FENTANYL CITRATE PF 50 MCG/ML IJ SOSY
PREFILLED_SYRINGE | INTRAMUSCULAR | Status: AC
  Filled 2023-08-12: qty 1

## 2023-08-12 MED ORDER — CEFAZOLIN SODIUM-DEXTROSE 2-4 GM/100ML-% IV SOLN
2.0000 g | INTRAVENOUS | Status: AC
  Administered 2023-08-12: 2 g via INTRAVENOUS
  Filled 2023-08-12: qty 100

## 2023-08-12 MED ORDER — BUPIVACAINE-EPINEPHRINE 0.25% -1:200000 IJ SOLN
INTRAMUSCULAR | Status: AC
  Filled 2023-08-12: qty 1

## 2023-08-12 MED ORDER — OXYCODONE HCL 5 MG PO TABS
5.0000 mg | ORAL_TABLET | ORAL | Status: DC | PRN
  Administered 2023-08-12 (×2): 5 mg via ORAL
  Administered 2023-08-13: 10 mg via ORAL
  Filled 2023-08-12 (×2): qty 1
  Filled 2023-08-12: qty 2

## 2023-08-12 MED ORDER — OXYCODONE HCL 5 MG PO TABS: 10.0000 mg | ORAL_TABLET | ORAL | Status: DC | PRN

## 2023-08-12 MED ORDER — SODIUM CHLORIDE 0.9 % IR SOLN
Status: DC | PRN
  Administered 2023-08-12: 1000 mL

## 2023-08-12 MED ORDER — 0.9 % SODIUM CHLORIDE (POUR BTL) OPTIME
TOPICAL | Status: DC | PRN
  Administered 2023-08-12: 1000 mL

## 2023-08-12 MED ORDER — METOCLOPRAMIDE HCL 5 MG PO TABS: 5.0000 mg | ORAL_TABLET | Freq: Three times a day (TID) | ORAL | Status: DC | PRN

## 2023-08-12 MED ORDER — DEXAMETHASONE SODIUM PHOSPHATE 10 MG/ML IJ SOLN: 8.0000 mg | Freq: Once | INTRAMUSCULAR | Status: DC

## 2023-08-12 MED ORDER — SENNA 8.6 MG PO TABS
2.0000 | ORAL_TABLET | Freq: Every day | ORAL | Status: DC
Start: 2023-08-12 — End: 2023-08-13
  Administered 2023-08-12: 17.2 mg via ORAL
  Filled 2023-08-12: qty 2

## 2023-08-12 MED ORDER — MIDAZOLAM HCL 2 MG/2ML IJ SOLN
INTRAMUSCULAR | Status: AC
  Administered 2023-08-12: 1 mg via INTRAVENOUS
  Filled 2023-08-12: qty 2

## 2023-08-12 MED ORDER — METHOCARBAMOL 1000 MG/10ML IJ SOLN: 500.0000 mg | Freq: Four times a day (QID) | INTRAMUSCULAR | Status: DC | PRN

## 2023-08-12 SURGICAL SUPPLY — 45 items
ATTUNE MED ANAT PAT 41 KNEE (Knees) IMPLANT
ATTUNE PS FEM RT SZ 8 CEM KNEE (Femur) IMPLANT
ATTUNE PSRP INSR SZ8 8 KNEE (Insert) IMPLANT
BAG COUNTER SPONGE SURGICOUNT (BAG) IMPLANT
BAG ZIPLOCK 12X15 (MISCELLANEOUS) ×1 IMPLANT
BLADE SAW SGTL 11.0X1.19X90.0M (BLADE) IMPLANT
BLADE SAW SGTL 13.0X1.19X90.0M (BLADE) ×1 IMPLANT
BNDG ELASTIC 6INX 5YD STR LF (GAUZE/BANDAGES/DRESSINGS) ×1 IMPLANT
BOWL SMART MIX CTS (DISPOSABLE) ×1 IMPLANT
CATH TIEMANN FOLEY 18FR 5CC (CATHETERS) IMPLANT
CEMENT HV SMART SET (Cement) IMPLANT
COVER SURGICAL LIGHT HANDLE (MISCELLANEOUS) ×1 IMPLANT
CUFF TRNQT CYL 34X4.125X (TOURNIQUET CUFF) ×1 IMPLANT
DERMABOND ADVANCED .7 DNX12 (GAUZE/BANDAGES/DRESSINGS) ×1 IMPLANT
DRAPE INCISE IOBAN 66X45 STRL (DRAPES) ×1 IMPLANT
DRAPE U-SHAPE 47X51 STRL (DRAPES) ×1 IMPLANT
DRSG AQUACEL AG ADV 3.5X10 (GAUZE/BANDAGES/DRESSINGS) ×1 IMPLANT
DRSG AQUACEL AG ADV 3.5X14 (GAUZE/BANDAGES/DRESSINGS) IMPLANT
DRSG TEGADERM 4X4.75 (GAUZE/BANDAGES/DRESSINGS) IMPLANT
DURAPREP 26ML APPLICATOR (WOUND CARE) ×2 IMPLANT
ELECT REM PT RETURN 15FT ADLT (MISCELLANEOUS) ×1 IMPLANT
GLOVE BIOGEL M 7.0 STRL (GLOVE) IMPLANT
GLOVE BIOGEL PI IND STRL 7.5 (GLOVE) ×3 IMPLANT
GLOVE BIOGEL PI IND STRL 8.5 (GLOVE) ×1 IMPLANT
GLOVE ECLIPSE 8.0 STRL XLNG CF (GLOVE) ×1 IMPLANT
GLOVE INDICATOR 6.5 STRL GRN (GLOVE) ×1 IMPLANT
GOWN STRL REUS W/ TWL LRG LVL3 (GOWN DISPOSABLE) ×2 IMPLANT
INSERT TIB CMT ATTUNE RP SZ8 (Knees) IMPLANT
KIT TURNOVER KIT A (KITS) IMPLANT
MANIFOLD NEPTUNE II (INSTRUMENTS) ×1 IMPLANT
NS IRRIG 1000ML POUR BTL (IV SOLUTION) ×2 IMPLANT
PACK TOTAL KNEE CUSTOM (KITS) ×1 IMPLANT
PIN FIX SIGMA LCS THRD HI (PIN) IMPLANT
PROTECTOR NERVE ULNAR (MISCELLANEOUS) ×1 IMPLANT
SET HNDPC FAN SPRY TIP SCT (DISPOSABLE) ×1 IMPLANT
SET PAD KNEE POSITIONER (MISCELLANEOUS) ×1 IMPLANT
SLEEVE TIB CMT ATTUNE 29 (Sleeve) IMPLANT
SPIKE FLUID TRANSFER (MISCELLANEOUS) ×1 IMPLANT
SUT MNCRL AB 4-0 PS2 18 (SUTURE) ×1 IMPLANT
SUT STRATAFIX PDS+ 0 24IN (SUTURE) ×1 IMPLANT
SUT VIC AB 1 CT1 36 (SUTURE) ×1 IMPLANT
SUT VIC AB 2-0 CT1 TAPERPNT 27 (SUTURE) ×3 IMPLANT
TRAY FOLEY MTR SLVR 16FR STAT (SET/KITS/TRAYS/PACK) ×1 IMPLANT
WATER STERILE IRR 1000ML POUR (IV SOLUTION) ×1 IMPLANT
WRAP KNEE MAXI GEL POST OP (GAUZE/BANDAGES/DRESSINGS) ×1 IMPLANT

## 2023-08-12 NOTE — Plan of Care (Signed)
°  Problem: Education: Goal: Knowledge of General Education information will improve Description: Including pain rating scale, medication(s)/side effects and non-pharmacologic comfort measures Outcome: Progressing   Problem: Activity: Goal: Risk for activity intolerance will decrease Outcome: Progressing   Problem: Nutrition: Goal: Adequate nutrition will be maintained Outcome: Progressing   Problem: Elimination: Goal: Will not experience complications related to bowel motility Outcome: Progressing   Problem: Safety: Goal: Ability to remain free from injury will improve Outcome: Progressing   Problem: Education: Goal: Knowledge of the prescribed therapeutic regimen will improve Outcome: Progressing   Problem: Activity: Goal: Ability to avoid complications of mobility impairment will improve Outcome: Progressing   Problem: Pain Management: Goal: Pain level will decrease with appropriate interventions Outcome: Progressing

## 2023-08-12 NOTE — Anesthesia Preprocedure Evaluation (Addendum)
Anesthesia Evaluation  Patient identified by MRN, date of birth, ID band Patient awake    Reviewed: Allergy & Precautions, H&P , NPO status , Patient's Chart, lab work & pertinent test results, reviewed documented beta blocker date and time   Airway Mallampati: III  TM Distance: >3 FB Neck ROM: Full    Dental no notable dental hx. (+) Teeth Intact, Dental Advisory Given   Pulmonary neg pulmonary ROS, Patient abstained from smoking.   Pulmonary exam normal breath sounds clear to auscultation       Cardiovascular hypertension, Pt. on medications and Pt. on home beta blockers + Peripheral Vascular Disease   Rhythm:Regular Rate:Normal     Neuro/Psych negative neurological ROS  negative psych ROS   GI/Hepatic negative GI ROS, Neg liver ROS,,,  Endo/Other    Class 3 obesity  Renal/GU negative Renal ROS  negative genitourinary   Musculoskeletal  (+) Arthritis , Osteoarthritis,    Abdominal   Peds  Hematology negative hematology ROS (+)   Anesthesia Other Findings   Reproductive/Obstetrics negative OB ROS                             Anesthesia Physical Anesthesia Plan  ASA: 3  Anesthesia Plan: Spinal   Post-op Pain Management: Regional block* and Ofirmev IV (intra-op)*   Induction: Intravenous  PONV Risk Score and Plan: 2 and Ondansetron, Dexamethasone and Propofol infusion  Airway Management Planned: Natural Airway and Simple Face Mask  Additional Equipment:   Intra-op Plan:   Post-operative Plan:   Informed Consent: I have reviewed the patients History and Physical, chart, labs and discussed the procedure including the risks, benefits and alternatives for the proposed anesthesia with the patient or authorized representative who has indicated his/her understanding and acceptance.     Dental advisory given  Plan Discussed with: CRNA  Anesthesia Plan Comments:         Anesthesia Quick Evaluation

## 2023-08-12 NOTE — Transfer of Care (Signed)
Immediate Anesthesia Transfer of Care Note  Patient: Robert [name]  Procedure(s) Performed: Procedure(s): CONVERSION TO TOTAL KNEE (Right)  Patient Location: PACU  Anesthesia Type:General  Level of Consciousness:  sedated, patient cooperative and responds to stimulation  Airway & Oxygen Therapy:Patient Spontanous Breathing and Patient connected to face mask oxgen  Post-op Assessment:  Report given to PACU RN and Post -op Vital signs reviewed and stable  Post vital signs:  Reviewed and stable  Last Vitals:  Vitals:   08/12/23 0920 08/12/23 0925  BP: (!) 137/96 (!) 128/93  Pulse: 66 67  Resp: 16 15  Temp:    SpO2: 97% 97%    Complications: No apparent anesthesia complications

## 2023-08-12 NOTE — Brief Op Note (Signed)
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  PRE-OPERATIVE DIAGNOSIS:  Failed right partial knee replacement with progression of osteoarthritis  POST-OPERATIVE DIAGNOSIS:  Failed right partial knee replacement with progression of osteoarthritis  PROCEDURE:  Procedure(s): CONVERSION TO TOTAL KNEE (Right)  SURGEON:  Surgeons and Role:    Durene Romans, MD - Primary  PHYSICIAN ASSISTANT: Rosalene Billings, PA-C  ANESTHESIA:   regional and spinal  EBL:  <200 cc  BLOOD ADMINISTERED:none  DRAINS: none   LOCAL MEDICATIONS USED:  MARCAINE     SPECIMEN:  No Specimen  DISPOSITION OF SPECIMEN:  N/A  COUNTS:  YES  TOURNIQUET:   Total Tourniquet Time Documented: Thigh (Right) - 50 minutes Total: Thigh (Right) - 50 minutes   DICTATION: .Other Dictation: Dictation Number [id number]  PLAN OF CARE: Admit for overnight observation  PATIENT DISPOSITION:  PACU - hemodynamically stable.   Delay start of Pharmacological VTE agent (>24hrs) due to surgical blood loss or risk of bleeding: no

## 2023-08-12 NOTE — Discharge Instructions (Signed)

## 2023-08-12 NOTE — H&P (Signed)
TOTAL KNEE ADMISSION H&P  Patient is being admitted for right total knee arthroplasty.  Therapy Plans: outpatient therapy at [facility] Disposition: Home with wife Planned DVT Prophylaxis: aspirin 81mg  BID DME needed: walker PCP: Dr. Tiburcio Pea - clearance received Vascular - Hx of AAA - clearance received TXA: IV Allergies: NKDA Anesthesia Concerns: none BMI: 39.7 Last HgbA1c: Not diabetic   Other: - Staying overnight at the hospital - No hx of VTE - Hx of prostate cancer - s/p radiation - 2 years ago - oxycodone, robaxin, tylenol, celebrex - Dealing with right foot/radicular pain  Subjective:  Chief Complaint:right knee pain.  HPI: Robert [name], 67 y.o. male, has a history of pain and functional disability in the right knee due to arthritis and has failed non-surgical conservative treatments for greater than 12 weeks to includeNSAID's and/or analgesics, corticosteriod injections, viscosupplementation injections, and activity modification.  Onset of symptoms was gradual, starting 2 years ago with gradually worsening course since that time. The patient noted prior procedures on the knee to include  unicompartmental arthroplasty on the right knee(s).  Patient currently rates pain in the right knee(s) at 8 out of 10 with activity. Patient has worsening of pain with activity and weight bearing, pain that interferes with activities of daily living, and pain with passive range of motion.  Patient has evidence of joint space narrowing by imaging studies.  There is no active infection.  Patient Active Problem List   Diagnosis Date Noted   Malignant neoplasm of prostate (HCC) 11/25/2021   Pure hypercholesterolemia 11/20/2021   Hypertension 11/20/2021   Fatty liver 11/20/2021   ED (erectile dysfunction) of organic origin 11/20/2021   Chronic gingivitis 11/20/2021   Allergic rhinitis due to pollen 11/20/2021   DDD (degenerative disc disease), cervical 01/17/2019   Cervical spine pain  12/20/2018   Weakness of left arm 12/09/2018   Pain in joint of left shoulder 12/08/2018   Hematoma 11/15/2018   Acute renal failure (HCC)    Cardiomyopathy (HCC)    Cardiac arrest (HCC)    Ruptured abdominal aortic aneurysm (AAA) (HCC) 11/05/2018   AAA (abdominal aortic aneurysm, ruptured) (HCC) 11/05/2018   Osteoarthritis of right hip 10/03/2018   Pain in joint of right hip 09/16/2018   Osteoarthritis of knee 09/30/2017   Umbilical hernia 02/18/2012   Past Medical History:  Diagnosis Date   AAA (abdominal aortic aneurysm, ruptured) (HCC)    Cancer (HCC)    prostate   Fibromuscular dysplasia of renal artery (HCC)    s/p angioplasty to both renal arteries in 2006   History of abdominal aortic aneurysm    History of cardiac catheterization    LHC in 2013:  no sig CAD   Hyperlipidemia    Hypertension    Peripheral vascular disease (HCC)     Past Surgical History:  Procedure Laterality Date   ABDOMINAL AORTIC ANEURYSM REPAIR N/A 11/05/2018   Procedure: ANEURYSM ABDOMINAL AORTIC REPAIR WITH HEMASHIELD GOLD VELOUR VASCULAR GRAFT;  Surgeon: Sherren Kerns, MD;  Location: [facility];  Service: Vascular;  Laterality: N/A;   CARDIAC SURGERY  09/21/2004   stent placement   COLONOSCOPY WITH PROPOFOL N/A 04/11/2021   Procedure: COLONOSCOPY WITH PROPOFOL;  Surgeon: Jeani Hawking, MD;  Location: [facility];  Service: Endoscopy;  Laterality: N/A;   GOLD SEED IMPLANT N/A 01/21/2022   Procedure: GOLD SEED IMPLANT;  Surgeon: Despina Arias, MD;  Location: [facility];  Service: Urology;  Laterality: N/A;   HERNIA REPAIR  HIP ARTHROPLASTY Right    INSERTION OF MESH  08/31/2012   Procedure: INSERTION OF MESH;  Surgeon: Wilmon Arms. Corliss Skains, MD;  Location: [facility];  Service: General;  Laterality: N/A;   KNEE SURGERY  2009/2010   right   KNEE SURGERY  09/21/2008   right knee partial replacement   LEFT HEART CATHETERIZATION WITH CORONARY ANGIOGRAM N/A  05/19/2012   Procedure: LEFT HEART CATHETERIZATION WITH CORONARY ANGIOGRAM;  Surgeon: Chrystie Nose, MD;  Location: [facility] C[facility];  Service: Cardiovascular;  Laterality: N/A;   POLYPECTOMY  04/11/2021   Procedure: POLYPECTOMY;  Surgeon: Jeani Hawking, MD;  Location: [facility];  Service: Endoscopy;;   SPACE OAR INSTILLATION N/A 01/21/2022   Procedure: SPACE OAR INSTILLATION;  Surgeon: Despina Arias, MD;  Location: [facility];  Service: Urology;  Laterality: N/A;   UMBILICAL HERNIA REPAIR  08/31/2012   Procedure: HERNIA REPAIR UMBILICAL ADULT;  Surgeon: Wilmon Arms. Corliss Skains, MD;  Location: [facility];  Service: General;  Laterality: N/A;  umbilical hernia repair with mesh    No current facility-administered medications for this encounter.   Current Outpatient Medications  Medication Sig Dispense Refill Last Dose   amLODipine (NORVASC) 10 MG tablet Take 1 tablet (10 mg total) by mouth daily. 30 tablet 1    carvedilol (COREG) 25 MG tablet Take 1 tablet (25 mg total) by mouth 2 (two) times daily with a meal. 60 tablet 1    diclofenac (VOLTAREN) 75 MG EC tablet Take 75 mg by mouth 2 (two) times daily.      fluticasone (FLONASE) 50 MCG/ACT nasal spray Place 1 spray into both nostrils daily as needed for allergies.      halobetasol (ULTRAVATE) 0.05 % ointment Apply 1 Application topically 2 (two) times daily as needed (rash).      pregabalin (LYRICA) 75 MG capsule Take 75 mg by mouth 2 (two) times daily.      rosuvastatin (CRESTOR) 20 MG tablet Take 20 mg by mouth daily.      sildenafil (VIAGRA) 100 MG tablet TAKE ONE TABLET BY MOUTH DAILY AS NEEDED FOR ERECTILE DYSFUNCTION (ALTERNATE WITH TADALAFIL)      tadalafil (CIALIS) 20 MG tablet TAKE ONE TABLET BY MOUTH DAILY AS NEEDED FOR ERECTILE DYSFUNCTION ALTERNATING WITH SILDENAFIL      No Known Allergies  Social History   Tobacco Use   Smoking status: Never   Smokeless tobacco: Never  Substance Use Topics    Alcohol use: Not Currently    Comment: only during football season    Family History  Problem Relation Age of Onset   Heart disease Father    Cancer Paternal Uncle        prostate   Cancer Paternal Uncle        prostate     Review of Systems  Constitutional:  Negative for chills and fever.  Respiratory:  Negative for cough and shortness of breath.   Cardiovascular:  Negative for chest pain.  Gastrointestinal:  Negative for nausea and vomiting.  Musculoskeletal:  Positive for arthralgias.     Objective:  Physical Exam Well nourished and well developed. General: Alert and oriented x3, cooperative and pleasant, no acute distress. Head: normocephalic, atraumatic, neck supple. Eyes: EOMI.  Musculoskeletal: Right Knee: No erythema, warmth, or effusion Generally full ROM, but increased pain with flexion of the knee Tender to palpation about the joint lines  Calves soft and nontender. Motor function intact in LE. Strength 5/5 LE bilaterally.  Neuro: Distal pulses 2+. Sensation to light touch intact in LE.  Vital signs in last 24 hours:    Labs:   Estimated body mass index is 36.64 kg/m as calculated from the following:   Height as of 08/04/23: 6\' 4"  (1.93 m).   Weight as of 08/04/23: 136.5 kg.   Imaging Review Plain radiographs demonstrate severe degenerative joint disease of the right knee(s). Prior medial UKR components stable. The overall alignment isneutral. The bone quality appears to be adequate for age and reported activity level.      Assessment/Plan:  End stage arthritis, right knee   The patient history, physical examination, clinical judgment of the provider and imaging studies are consistent with end stage degenerative joint disease of the right knee(s) and total knee arthroplasty is deemed medically necessary. The treatment options including medical management, injection therapy arthroscopy and arthroplasty were discussed at length. The risks and  benefits of total knee arthroplasty were presented and reviewed. The risks due to aseptic loosening, infection, stiffness, patella tracking problems, thromboembolic complications and other imponderables were discussed. The patient acknowledged the explanation, agreed to proceed with the plan and consent was signed. Patient is being admitted for inpatient treatment for surgery, pain control, PT, OT, prophylactic antibiotics, VTE prophylaxis, progressive ambulation and ADL's and discharge planning. The patient is planning to be discharged  home.     Patient's anticipated LOS is less than 2 midnights, meeting these requirements: - Younger than 45 - Lives within 1 hour of care - Has a competent adult at home to recover with post-op recover - NO history of  - Chronic pain requiring opiods  - Diabetes  - Coronary Artery Disease  - Heart failure  - Heart attack  - Stroke  - DVT/VTE  - Cardiac arrhythmia  - Respiratory Failure/COPD  - Renal failure  - Anemia  - Advanced Liver disease  Rosalene Billings, PA-C Orthopedic Surgery [facility] [place] [phone]

## 2023-08-12 NOTE — OR Nursing (Signed)
Components explanted from right knee by Charlann Boxer, MD

## 2023-08-12 NOTE — Evaluation (Signed)
Physical Therapy Evaluation Patient Details Name: Robert [name] MRN: [id number] DOB: 03/14/1956 Today's Date: 08/12/2023  History of Present Illness  Pt s/p R TKR conversion from UKR; PVD, AAA, and prostate CA  Clinical Impression  Pt s/p R TKR and presents with decreased R LE strength/ROM and post op pain limiting functional mobility.  Pt should progress to dc home with family assist and reports first OP PT scheduled for 08/16/23.        If plan is discharge home, recommend the following: A little help with walking and/or transfers   Can travel by private vehicle        Equipment Recommendations None recommended by PT  Recommendations for Other Services       Functional Status Assessment Patient has had a recent decline in their functional status and demonstrates the ability to make significant improvements in function in a reasonable and predictable amount of time.     Precautions / Restrictions Precautions Precautions: Knee;Fall Restrictions Weight Bearing Restrictions: No Other Position/Activity Restrictions: wbat      Mobility  Bed Mobility Overal bed mobility: Needs Assistance Bed Mobility: Supine to Sit     Supine to sit: Min assist     General bed mobility comments: use of bed rail; min assist for R LE    Transfers Overall transfer level: Needs assistance Equipment used: Rolling walker (2 wheels) Transfers: Sit to/from Stand Sit to Stand: Contact guard assist           General transfer comment: cues for LE management and use of UEs to self assist    Ambulation/Gait Ambulation/Gait assistance: Min assist, Contact guard assist Gait Distance (Feet): 90 Feet Assistive device: Rolling walker (2 wheels) Gait Pattern/deviations: Step-to pattern, Step-through pattern, Decreased step length - right, Decreased step length - left, Shuffle, Trunk flexed       General Gait Details: cues for posture, position from RW and sequence  Stairs             Wheelchair Mobility     Tilt Bed    Modified Rankin (Stroke Patients Only)       Balance Overall balance assessment: Mild deficits observed, not formally tested                                           Pertinent Vitals/Pain Pain Assessment Pain Assessment: 0-10 Pain Score: 3  Pain Location: R knee Pain Descriptors / Indicators: Aching, Sore Pain Intervention(s): Limited activity within patient's tolerance, Monitored during session, Premedicated before session, Ice applied    Home Living Family/patient expects to be discharged to:: Private residence Living Arrangements: Spouse/significant other Available Help at Discharge: Family;Available 24 hours/day Type of Home: House Home Access: Stairs to enter   [employer] of Steps: 1   Home Layout: One level Home Equipment: Agricultural consultant (2 wheels);Cane - single point;Crutches      Prior Function Prior Level of Function : Independent/Modified Independent             Mobility Comments: wearing knee braces for support       Extremity/Trunk Assessment   Upper Extremity Assessment Upper Extremity Assessment: Overall WFL for tasks assessed    Lower Extremity Assessment Lower Extremity Assessment: RLE deficits/detail    Cervical / Trunk Assessment Cervical / Trunk Assessment: Normal  Communication   Communication Communication: No apparent difficulties  Cognition Arousal: Alert Behavior During  Therapy: WFL for tasks assessed/performed Overall Cognitive Status: Within Functional Limits for tasks assessed                                          General Comments      Exercises Total Joint Exercises Ankle Circles/Pumps: AROM, Both, 15 reps, Supine   Assessment/Plan    PT Assessment Patient needs continued PT services  PT Problem List Decreased strength;Decreased range of motion;Decreased activity tolerance;Decreased knowledge of use of DME        PT Treatment Interventions DME instruction;Gait training;Stair training;Functional mobility training;Therapeutic activities;Therapeutic exercise;Patient/family education    PT Goals (Current goals can be found in the Care Plan section)  Acute Rehab PT Goals Patient Stated Goal: Regain IND PT Goal Formulation: With patient Time For Goal Achievement: 08/19/23 Potential to Achieve Goals: Good    Frequency 7X/week     Co-evaluation               AM-PAC PT "6 Clicks" Mobility  Outcome Measure Help needed turning from your back to your side while in a flat bed without using bedrails?: A Little Help needed moving from lying on your back to sitting on the side of a flat bed without using bedrails?: A Little Help needed moving to and from a bed to a chair (including a wheelchair)?: A Little Help needed standing up from a chair using your arms (e.g., wheelchair or bedside chair)?: A Little Help needed to walk in hospital room?: A Little Help needed climbing 3-5 steps with a railing? : A Lot 6 Click Score: 17    End of Session Equipment Utilized During Treatment: Gait belt Activity Tolerance: Patient tolerated treatment well Patient left: in chair;with call bell/phone within reach Nurse Communication: Mobility status PT Visit Diagnosis: Difficulty in walking, not elsewhere classified (R26.2)    Time: 4010-2725 PT Time Calculation (min) (ACUTE ONLY): 28 min   Charges:   PT Evaluation $PT Eval Low Complexity: 1 Low PT Treatments $Gait Training: 8-22 mins PT General Charges $$ ACUTE PT VISIT: 1 Visit         [name] PT Acute Rehabilitation Services Pager [phone] Office [phone]   [name] 08/12/2023, 4:44 PM

## 2023-08-12 NOTE — Plan of Care (Signed)
  Problem: Education: Goal: Knowledge of General Education information will improve Description: Including pain rating scale, medication(s)/side effects and non-pharmacologic comfort measures Outcome: Progressing   Problem: Health Behavior/Discharge Planning: Goal: Ability to manage health-related needs will improve Outcome: Progressing   Problem: Nutrition: Goal: Adequate nutrition will be maintained Outcome: Progressing   Problem: Coping: Goal: Level of anxiety will decrease Outcome: Progressing   Problem: Pain Management: Goal: General experience of comfort will improve Outcome: Progressing

## 2023-08-12 NOTE — Plan of Care (Signed)

## 2023-08-12 NOTE — Anesthesia Procedure Notes (Signed)
Anesthesia Regional Block: Adductor canal block   Pre-Anesthetic Checklist: , timeout performed,  Correct Patient, Correct Site, Correct Laterality,  Correct Procedure, Correct Position, site marked,  Risks and benefits discussed,  Pre-op evaluation,  At surgeon's request and post-op pain management  Laterality: Right  Prep: Maximum Sterile Barrier Precautions used, chloraprep       Needles:  Injection technique: Single-shot  Needle Type: Echogenic Stimulator Needle     Needle Length: 9cm  Needle Gauge: 21     Additional Needles:   Procedures:,,,, ultrasound used (permanent image in chart),,    Narrative:  Start time: 08/12/2023 8:49 AM End time: 08/12/2023 8:59 AM Injection made incrementally with aspirations every 5 mL.  Performed by: Personally  Anesthesiologist: Gaynelle Adu, MD

## 2023-08-12 NOTE — Interval H&P Note (Signed)
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  The various methods of treatment have been discussed with the patient and family. After consideration of risks, benefits and other options for treatment, the patient has consented to  Procedure(s): CONVERSION TO TOTAL KNEE (Right) as a surgical intervention.  The patient's history has been reviewed, patient examined, no change in status, stable for surgery.  I have reviewed the patient's chart and labs.  Questions were answered to the patient's satisfaction.     Robert [name]

## 2023-08-12 NOTE — Anesthesia Postprocedure Evaluation (Signed)
Anesthesia Post Note  Patient: Robert [name]  Procedure(s) Performed: CONVERSION TO TOTAL KNEE (Right: Knee)     Patient location during evaluation: PACU Anesthesia Type: Spinal and Regional Level of consciousness: oriented and awake and alert Pain management: pain level controlled Vital Signs Assessment: post-procedure vital signs reviewed and stable Respiratory status: spontaneous breathing and respiratory function stable Cardiovascular status: blood pressure returned to baseline and stable Postop Assessment: no headache, no backache, no apparent nausea or vomiting, spinal receding and patient able to bend at knees Anesthetic complications: no  No notable events documented.  Last Vitals:  Vitals:   08/12/23 1315 08/12/23 1408  BP: (!) 139/100 (!) 152/99  Pulse: (!) 59 66  Resp: 18 16  Temp: 36.5 C 36.4 C  SpO2: 94% 98%    Last Pain:  Vitals:   08/12/23 1315  TempSrc:   PainSc: 0-No pain                 Yonah Tangeman,W. EDMOND

## 2023-08-12 NOTE — Op Note (Signed)
NAME: Robert [name], Robert [name]. MEDICAL RECORD NO: [id number] ACCOUNT NO: [id number] DATE OF BIRTH: 1955/09/30 FACILITY: [facility] LOCATION: [facility] PHYSICIAN: Madlyn Frankel. 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  POSTOPERATIVE DIAGNOSIS:  Failed right partial knee arthroplasty secondary to progressive advanced arthritic changes to the lateral and patellofemoral compartments.  PROCEDURE:  Conversion of failed right partial knee arthroplasty to right total knee arthroplasty.  COMPONENTS USED:  DePuy Attune knee system with a size 8 femur, a size 8 revision tibial tray with a 29 cemented sleeve.  We used an 8 mm posterior stabilized insert and a 41 patella button.  SURGEON:  Madlyn Frankel. Charlann Boxer, MD  ASSISTANT:  Rosalene Billings, PA-C.  Note that Ms. Domenic Schwab was present for the entirety of the case from preoperative position, perioperative management of the operative extremity, general facilitation of the case, and primary wound closure.  ANESTHESIA:  Regional plus spinal.  BLOOD LOSS:  Less than 200 mL.  TOURNIQUET:  Up for 50 minutes 250 mmHg.  INDICATIONS FOR THE PROCEDURE:  The patient is a pleasant 67 year old male with a history of right partial knee arthroplasty performed in 2010.  He had been doing well until recently complaining of right knee pain.  Radiographic evaluation in the office  revealed that he had advanced right knee arthritic changes to the lateral and patellofemoral compartments.  Based on the significant degenerative changes present, we discussed proceeding with arthroplasty, conversion of his failed partial knee  arthroplasty to total knee replacement.  Risks of infection, DVT, component failure, stiffness, and need for future surgeries were discussed and reviewed.  Based on the pain in his right knee  and the effect on his quality of life and function, he elected  to proceed.  Surgery was scheduled and consent obtained for the benefit of pain relief.  DESCRIPTION OF PROCEDURE:  The patient was brought to the operative theater.  Once adequate anesthesia, preoperative antibiotics, Ancef administered as well as tranexamic acid and Decadron, he was positioned supine with the right thigh tourniquet placed.   The right lower extremity was then prepped and draped in a sterile fashion.  A timeout was performed identifying the patient, planned procedure, and extremity.  Leg was exsanguinated and the tourniquet elevated to 250 mmHg.  His old incision was  excised and extended proximally and distally for exposure.  Soft tissue plane was created.  A median arthrotomy was made encountering clear synovial fluid.  Initial exposure was carried out recreating the medial suprapatellar and lateral gutters.  Soft  tissue debridement including proximal medial peel was carried out.  I then debrided around the patella.  Precut measurement of the patella was noted to be 26 mm.  I resected down to 14 mm and used a 41 patellar button.  Lug holes were drilled and a metal  shim placed to protect the patellofemoral retractors and saw blades.  Partial lateral facetectomy was performed following debridement of the soft tissues laterally.  The knee was then flexed.  Following further debridement as necessary, I created a hole  into the distal femur.  We irrigated this to prevent fat emboli.  I then passed an intramedullary rod and at 5 degrees of valgus, made a resection of 10 mm off the distal femur.  The lateral femoral condyle was excised and the cut was initiated  medially.  We then used thin  osteotomes to remove the femoral condyle medially under the medial component.  This was done without bone loss.  I then replaced the jig and finished the cut on the distal femur.  There was no significant bone loss on the  distal aspect of  the medial femur.  Once this was done, the tibia was subluxated anteriorly.  I used an extramedullary guide positioned underneath the medial tibial tray.  I again initiated this cut using an oscillating saw.  Once I had done this, I  removed the extramedullary guide and then used thin osteotomes to remove the tibial component.  Again, this was removed without bone loss.  I replaced the extramedullary guide and finished up the cut.  The proximal tibial cut was removed.  At this point,  we assessed the extension gap and found that it would be stable with a 6-mm insert thus far.  At this point, I directed attention back to the femur.  The femur was sized to be a size 8.  I set the rotation block based off the proximal tibial cut.  The 4-in-1 cutting block was then pinned into position.  The anterior, posterior, and chamfer cuts  were made without difficulty nor notching.  The final box cut was made off the lateral aspect of the distal femur.  The tibia was then subluxated anteriorly.  Based on his radiographic appearance and his size, I elected to use a revision tibial tray.   Additionally, finding significant cystic change in the proximal tibia, I elected to use a sleeve to help with rotational stability.  The proximal tibia cut was sized to be an 8.  We drilled and created room for the revision tray.  I then broached to the  29 broach.  We did a trial reduction with an 8 femur, 8 tibial base plate in place.  With this, I found that the 8 mm insert balanced the knee in extension to flexion.  The patella tracked through the trochlea without application of pressure.  All trial components were removed.  Final preparation of the bone was carried out by drilling holes into the sclerotic bone to allow for cement interdigitation.  The posterior capsule of the knee was injected with 0.25% Marcaine with epinephrine and 1 mL  of Toradol and saline.  Once this was done, the knee was copiously irrigated with normal  saline solution as the final components were opened and configured on the back table under direct supervision.  Cement was mixed.  The final components were  cemented onto clean and dried cut surfaces of the bone.  The 8 mm trial insert was placed and the knee brought to extension to allow the cement to fully cure.  Once the cement fully cured, excessive cement was removed throughout the knee.  We selected  the 8-mm posterior stabilized insert.  The insert was placed into the tibial tray and the knee reduced.  The tourniquet had been let down after 50 minutes.  The extensor mechanism was then reapproximated using #1 Vicryl and #1 Stratafix suture.  The  remainder of the wound was closed with 2-0 Vicryl and a running Monocryl stitch.  The wound was clean, dry, and dressed sterilely using surgical glue and Aquacel dressing.  The patient was brought to the recovery room in stable condition and tolerated  the procedure well.  Findings were reviewed with his wife.  He will be admitted to the hospital overnight for observation to work with physical therapy.  We will follow  up with him in two weeks.   PUS D: 08/12/2023 11:42:00 am T: 08/12/2023 12:03:00 pm  JOB: [id number]/ [id number]

## 2023-08-12 NOTE — Anesthesia Procedure Notes (Signed)
Spinal  Patient location during procedure: OR Start time: 08/12/2023 9:50 AM End time: 08/12/2023 9:55 AM Reason for block: surgical anesthesia Staffing Performed: anesthesiologist  Anesthesiologist: Gaynelle Adu, MD Performed by: Gaynelle Adu, MD Authorized by: Gaynelle Adu, MD   Preanesthetic Checklist Completed: patient identified, IV checked, risks and benefits discussed, surgical consent, monitors and equipment checked, pre-op evaluation and timeout performed Spinal Block Patient position: sitting Prep: DuraPrep Patient monitoring: cardiac monitor, continuous pulse ox and blood pressure Approach: midline Location: L3-4 Injection technique: single-shot Needle Needle type: Pencan  Needle gauge: 24 G Needle length: 9 cm Assessment Sensory level: T8 Events: CSF return Additional Notes Functioning IV was confirmed and monitors were applied. Sterile prep and drape, including hand hygiene and sterile gloves were used. The patient was positioned and the spine was prepped. The skin was anesthetized with lidocaine.  Free flow of clear CSF was obtained prior to injecting local anesthetic into the CSF.  The spinal needle aspirated freely following injection.  The needle was carefully withdrawn.  The patient tolerated the procedure well.

## 2023-08-13 ENCOUNTER — Encounter (HOSPITAL_COMMUNITY): Payer: Self-pay | Admitting: Orthopedic Surgery

## 2023-08-13 DIAGNOSIS — M1711 Unilateral primary osteoarthritis, right knee: Secondary | ICD-10-CM | POA: Diagnosis not present

## 2023-08-13 LAB — BASIC METABOLIC PANEL
Anion gap: 10 (ref 5–15)
BUN: 15 mg/dL (ref 8–23)
CO2: 23 mmol/L (ref 22–32)
Calcium: 8.8 mg/dL — ABNORMAL LOW (ref 8.9–10.3)
Chloride: 105 mmol/L (ref 98–111)
Creatinine, Ser: 0.99 mg/dL (ref 0.61–1.24)
GFR, Estimated: >60 mL/min (ref >60)
Glucose, Bld: 215 mg/dL — ABNORMAL HIGH (ref 70–99)
Potassium: 4 mmol/L (ref 3.5–5.1)
Sodium: 138 mmol/L (ref 135–145)

## 2023-08-13 LAB — CBC
HCT: 41 % (ref 39.0–52.0)
Hemoglobin: 13.9 g/dL (ref 13.0–17.0)
MCH: 32.5 pg (ref 26.0–34.0)
MCHC: 33.9 g/dL (ref 30.0–36.0)
MCV: 95.8 fL (ref 80.0–100.0)
Platelets: 149 10*3/uL — ABNORMAL LOW (ref 150–400)
RBC: 4.28 MIL/uL (ref 4.22–5.81)
RDW: 13 % (ref 11.5–15.5)
WBC: 13.8 10*3/uL — ABNORMAL HIGH (ref 4.0–10.5)
nRBC: 0 % (ref 0.0–0.2)

## 2023-08-13 MED ORDER — POLYETHYLENE GLYCOL 3350 17 G PO PACK: 17.0000 g | PACK | Freq: Two times a day (BID) | ORAL | 0 refills | Status: AC

## 2023-08-13 MED ORDER — OXYCODONE HCL 5 MG PO TABS: 5.0000 mg | ORAL_TABLET | ORAL | 0 refills | Status: AC | PRN

## 2023-08-13 MED ORDER — CELECOXIB 200 MG PO CAPS
200.0000 mg | ORAL_CAPSULE | Freq: Two times a day (BID) | ORAL | Status: DC
  Administered 2023-08-13: 200 mg via ORAL
  Filled 2023-08-13: qty 1

## 2023-08-13 MED ORDER — ASPIRIN 81 MG PO CHEW: 81.0000 mg | CHEWABLE_TABLET | Freq: Two times a day (BID) | ORAL | 0 refills | Status: AC

## 2023-08-13 MED ORDER — METHOCARBAMOL 500 MG PO TABS: 500.0000 mg | ORAL_TABLET | Freq: Four times a day (QID) | ORAL | 2 refills | Status: AC | PRN

## 2023-08-13 MED ORDER — CELECOXIB 200 MG PO CAPS: 200.0000 mg | ORAL_CAPSULE | Freq: Two times a day (BID) | ORAL | 0 refills | Status: AC

## 2023-08-13 MED ORDER — SENNA 8.6 MG PO TABS: 2.0000 | ORAL_TABLET | Freq: Every day | ORAL | 0 refills | Status: AC

## 2023-08-13 NOTE — TOC Transition Note (Signed)
Transition of Care [facility]) - CM/SW Discharge Note   Patient Details  Name: Robert [name] MRN: [id number] Date of Birth: 04/30/1956  Transition of [facility] CM/SW Contact:  Amada Jupiter, LCSW Phone Number: 08/13/2023, 9:25 AM   Clinical Narrative:     Met with pt who confirms he has needed DME in the home.  OPPT already with [facility].  No TOC needs.  Final next level of care: OP Rehab Barriers to Discharge: No Barriers Identified   Patient Goals and CMS Choice      Discharge Placement                         Discharge Plan and Services Additional resources added to the After Visit Summary for                  DME Arranged: N/A DME Agency: NA                  Social Determinants of Health (SDOH) Interventions SDOH Screenings   Food Insecurity: No Food Insecurity (08/12/2023)  Housing: Low Risk  (08/12/2023)  Transportation Needs: No Transportation Needs (08/12/2023)  Utilities: Not At Risk (08/12/2023)  Tobacco Use: Low Risk  (08/12/2023)     Readmission Risk Interventions     No data to display

## 2023-08-13 NOTE — Progress Notes (Signed)
   Subjective: 1 Day Post-Op Procedure(s) (LRB): CONVERSION TO TOTAL KNEE (Right) Patient reports pain as mild.   Patient seen in rounds with Dr. Charlann Boxer. Patient is well, and has had no acute complaints or problems. No acute events overnight. Foley catheter removed. Patient ambulated 90 feet with PT.  We will start therapy today.   Objective: Vital signs in last 24 hours: Temp:  [97.6 F (36.4 C)-98.9 F (37.2 C)] 98.6 F (37 C) (11/22 0525) Pulse Rate:  [59-80] 75 (11/22 0525) Resp:  [15-24] 17 (11/22 0525) BP: (128-161)/(90-112) 147/97 (11/22 0525) SpO2:  [91 %-98 %] 95 % (11/22 0525) Weight:  [136.5 kg] 136.5 kg (11/21 0823)  Intake/Output from previous day:  Intake/Output Summary (Last 24 hours) at 08/13/2023 0744 Last data filed at 08/13/2023 0525 Gross per 24 hour  Intake 1260 ml  Output 2150 ml  Net -890 ml     Intake/Output this shift: No intake/output data recorded.  Labs: Recent Labs    08/13/23 0317  HGB 13.9   Recent Labs    08/13/23 0317  WBC 13.8*  RBC 4.28  HCT 41.0  PLT 149*   Recent Labs    08/13/23 0317  NA 138  K 4.0  CL 105  CO2 23  BUN 15  CREATININE 0.99  GLUCOSE 215*  CALCIUM 8.8*   No results for input(s): "LABPT", "INR" in the last 72 hours.  Exam: General - Patient is Alert and Oriented Extremity - Neurologically intact Sensation intact distally Intact pulses distally Dorsiflexion/Plantar flexion intact Dressing - dressing C/D/I Motor Function - intact, moving foot and toes well on exam.   Past Medical History:  Diagnosis Date   AAA (abdominal aortic aneurysm, ruptured) (HCC)    Cancer (HCC)    prostate   Fibromuscular dysplasia of renal artery (HCC)    s/p angioplasty to both renal arteries in 2006   History of abdominal aortic aneurysm    History of cardiac catheterization    LHC in 2013:  no sig CAD   Hyperlipidemia    Hypertension    Peripheral vascular disease (HCC)     Assessment/Plan: 1 Day Post-Op  Procedure(s) (LRB): CONVERSION TO TOTAL KNEE (Right) Principal Problem:   S/P total knee arthroplasty, right  Estimated body mass index is 36.64 kg/m as calculated from the following:   Height as of this encounter: 6\' 4"  (1.93 m).   Weight as of this encounter: 136.5 kg. Advance diet Up with therapy D/C IV fluids   Patient's anticipated LOS is less than 2 midnights, meeting these requirements: - Younger than 60 - Lives within 1 hour of care - Has a competent adult at home to recover with post-op recover - NO history of  - Chronic pain requiring opiods  - Diabetes  - Coronary Artery Disease  - Heart failure  - Heart attack  - Stroke  - DVT/VTE  - Cardiac arrhythmia  - Respiratory Failure/COPD  - Renal failure  - Anemia  - Advanced Liver disease     DVT Prophylaxis - Aspirin Weight bearing as tolerated.  Hgb stable at 13.9 this AM.  Plan is to go Home after hospital stay. Plan for discharge today following 1-2 sessions of PT as long as they are meeting their goals. Patient is scheduled for OPPT. Follow up in the office in 2 weeks.   Rosalene Billings, PA-C Orthopedic Surgery [phone] 08/13/2023, 7:44 AM

## 2023-08-13 NOTE — Progress Notes (Signed)
AVS reviewed, patient discharged to home.

## 2023-08-13 NOTE — Progress Notes (Signed)
Physical Therapy Treatment Patient Details Name: Robert [name] MRN: [id number] DOB: 1956/09/21 Today's Date: 08/13/2023   History of Present Illness Pt s/p R TKR conversion from UKR; PVD, AAA, and prostate CA    PT Comments  Pt motivated and progressing well with mobility.  Pt ambulated increased distance, negotiated stairs, and performed HEP with written instruction provided.  Multiple questions asked and answered.  Pt eager for dc home this date.    If plan is discharge home, recommend the following: A little help with walking and/or transfers   Can travel by private vehicle        Equipment Recommendations  None recommended by PT    Recommendations for Other Services       Precautions / Restrictions Precautions Precautions: Knee;Fall Restrictions Weight Bearing Restrictions: No Other Position/Activity Restrictions: wbat     Mobility  Bed Mobility Overal bed mobility: Needs Assistance Bed Mobility: Supine to Sit     Supine to sit: Supervision     General bed mobility comments: Pt self assisting with gait belt    Transfers Overall transfer level: Needs assistance Equipment used: Rolling walker (2 wheels) Transfers: Sit to/from Stand Sit to Stand: Supervision           General transfer comment: cues for LE management and use of UEs to self assist    Ambulation/Gait Ambulation/Gait assistance: Contact guard assist, Supervision Gait Distance (Feet): 120 Feet Assistive device: Rolling walker (2 wheels) Gait Pattern/deviations: Step-to pattern, Step-through pattern, Decreased step length - right, Decreased step length - left, Shuffle, Trunk flexed Gait velocity: decr     General Gait Details: cues for posture, position from RW and sequence   Stairs Stairs: Yes Stairs assistance: Contact guard assist Stair Management: No rails, Step to pattern, Forwards, With walker Number of Stairs: 2 General stair comments: single step twice with RW and cues  for sequenc   Wheelchair Mobility     Tilt Bed    Modified Rankin (Stroke Patients Only)       Balance Overall balance assessment: Mild deficits observed, not formally tested                                          Cognition Arousal: Alert Behavior During Therapy: WFL for tasks assessed/performed Overall Cognitive Status: Within Functional Limits for tasks assessed                                          Exercises Total Joint Exercises Ankle Circles/Pumps: AROM, Both, 15 reps, Supine Quad Sets: AROM, Both, 10 reps, Supine Heel Slides: AAROM, Right, 15 reps, Supine Hip ABduction/ADduction: AAROM, Right, 10 reps, Supine Straight Leg Raises: AAROM, AROM, Right, 15 reps, Supine Long Arc Quad: AAROM, Right, 10 reps, Seated, Supine    General Comments        Pertinent Vitals/Pain Pain Assessment Pain Assessment: 0-10 Pain Score: 4  Pain Location: R knee Pain Descriptors / Indicators: Aching, Sore Pain Intervention(s): Limited activity within patient's tolerance, Monitored during session, Premedicated before session, Ice applied    Home Living                          Prior Function  PT Goals (current goals can now be found in the care plan section) Acute Rehab PT Goals Patient Stated Goal: Regain IND PT Goal Formulation: With patient Time For Goal Achievement: 08/19/23 Potential to Achieve Goals: Good Progress towards PT goals: Progressing toward goals    Frequency    7X/week      PT Plan      Co-evaluation              AM-PAC PT "6 Clicks" Mobility   Outcome Measure  Help needed turning from your back to your side while in a flat bed without using bedrails?: A Little Help needed moving from lying on your back to sitting on the side of a flat bed without using bedrails?: A Little Help needed moving to and from a bed to a chair (including a wheelchair)?: A Little Help needed standing  up from a chair using your arms (e.g., wheelchair or bedside chair)?: A Little Help needed to walk in hospital room?: A Little Help needed climbing 3-5 steps with a railing? : A Little 6 Click Score: 18    End of Session Equipment Utilized During Treatment: Gait belt Activity Tolerance: Patient tolerated treatment well Patient left: in chair;with call bell/phone within reach Nurse Communication: Mobility status PT Visit Diagnosis: Difficulty in walking, not elsewhere classified (R26.2)     Time: 7829-5621 PT Time Calculation (min) (ACUTE ONLY): 44 min  Charges:    $Gait Training: 8-22 mins $Therapeutic Exercise: 8-22 mins $Therapeutic Activity: 8-22 mins PT General Charges $$ ACUTE PT VISIT: 1 Visit                     [name] PT Acute Rehabilitation Services Pager [phone] Office [phone]    Grace Haggart 08/13/2023, 1:12 PM

## 2023-08-26 NOTE — Discharge Summary (Signed)
Patient ID: Robert [name] MRN: [id number] DOB/AGE: 11-22-1955 67 y.o.  Admit date: 08/12/2023 Discharge date: 08/13/2023  Admission Diagnoses:  Right knee osteoarthritis  Discharge Diagnoses:  Principal Problem:   S/P total knee arthroplasty, right   Past Medical History:  Diagnosis Date   AAA (abdominal aortic aneurysm, ruptured) (HCC)    Cancer (HCC)    prostate   Fibromuscular dysplasia of renal artery (HCC)    s/p angioplasty to both renal arteries in 2006   History of abdominal aortic aneurysm    History of cardiac catheterization    LHC in 2013:  no sig CAD   Hyperlipidemia    Hypertension    Peripheral vascular disease (HCC)     Surgeries: Procedure(s): CONVERSION TO TOTAL KNEE on 08/12/2023   Consultants:   Discharged Condition: Improved  Hospital Course: Robert [name] is an 67 y.o. male who was admitted 08/12/2023 for operative treatment ofS/P total knee arthroplasty, right. Patient has severe unremitting pain that affects sleep, daily activities, and work/hobbies. After pre-op clearance the patient was taken to the operating room on 08/12/2023 and underwent  Procedure(s): CONVERSION TO TOTAL KNEE.    Patient was given perioperative antibiotics:  Anti-infectives (From admission, onward)    Start     Dose/Rate Route Frequency Ordered Stop   08/12/23 1600  ceFAZolin (ANCEF) IVPB 2g/100 mL premix        2 g 200 mL/hr over 30 Minutes Intravenous Every 6 hours 08/12/23 1423 08/13/23 0812   08/12/23 0800  ceFAZolin (ANCEF) IVPB 2g/100 mL premix        2 g 200 mL/hr over 30 Minutes Intravenous On call to O.R. 08/12/23 0755 08/13/23 1048        Patient was given sequential compression devices, early ambulation, and chemoprophylaxis to prevent DVT. Patient worked with PT and was meeting their goals regarding safe ambulation and transfers.  Patient benefited maximally from hospital stay and there were no complications.    Recent vital signs: No  data found.   Recent laboratory studies: No results for input(s): "WBC", "HGB", "HCT", "PLT", "NA", "K", "CL", "CO2", "BUN", "CREATININE", "GLUCOSE", "INR", "CALCIUM" in the last 72 hours.  Invalid input(s): "PT", "2"   Discharge Medications:   Allergies as of 08/13/2023   No Known Allergies      Medication List     STOP taking these medications    diclofenac 75 MG EC tablet Commonly known as: VOLTAREN   rosuvastatin 20 MG tablet Commonly known as: CRESTOR       TAKE these medications    amLODipine 10 MG tablet Commonly known as: NORVASC Take 1 tablet (10 mg total) by mouth daily.   aspirin 81 MG chewable tablet Chew 1 tablet (81 mg total) by mouth 2 (two) times daily for 28 days.   carvedilol 25 MG tablet Commonly known as: COREG Take 1 tablet (25 mg total) by mouth 2 (two) times daily with a meal.   celecoxib 200 MG capsule Commonly known as: CELEBREX Take 1 capsule (200 mg total) by mouth 2 (two) times daily.   fluticasone 50 MCG/ACT nasal spray Commonly known as: FLONASE Place 1 spray into both nostrils daily as needed for allergies.   halobetasol 0.05 % ointment Commonly known as: ULTRAVATE Apply 1 Application topically 2 (two) times daily as needed (rash).   methocarbamol 500 MG tablet Commonly known as: ROBAXIN Take 1 tablet (500 mg total) by mouth every 6 (six) hours as needed for muscle spasms.   oxyCODONE  5 MG immediate release tablet Commonly known as: Oxy IR/ROXICODONE Take 1 tablet (5 mg total) by mouth every 4 (four) hours as needed for severe pain (pain score 7-10).   polyethylene glycol 17 g packet Commonly known as: MIRALAX / GLYCOLAX Take 17 g by mouth 2 (two) times daily.   pregabalin 75 MG capsule Commonly known as: LYRICA Take 75 mg by mouth 2 (two) times daily.   senna 8.6 MG Tabs tablet Commonly known as: SENOKOT Take 2 tablets (17.2 mg total) by mouth at bedtime for 14 days.   sildenafil 100 MG tablet Commonly known as:  VIAGRA TAKE ONE TABLET BY MOUTH DAILY AS NEEDED FOR ERECTILE DYSFUNCTION (ALTERNATE WITH TADALAFIL)   tadalafil 20 MG tablet Commonly known as: CIALIS TAKE ONE TABLET BY MOUTH DAILY AS NEEDED FOR ERECTILE DYSFUNCTION ALTERNATING WITH SILDENAFIL               Discharge Care Instructions  (From admission, onward)           Start     Ordered   08/13/23 0000  Change dressing       Comments: Maintain surgical dressing until follow up in the clinic. If the edges start to pull up, may reinforce with tape. If the dressing is no longer working, may remove and cover with gauze and tape, but must keep the area dry and clean.  Call with any questions or concerns.   08/13/23 0747            Diagnostic Studies: No results found.  Disposition: Discharge disposition: 01-Home or Self Care       Discharge Instructions     Call MD / Call 911   Complete by: As directed    If you experience chest pain or shortness of breath, CALL 911 and be transported to the hospital emergency room.  If you develope a fever above 101 F, pus (white drainage) or increased drainage or redness at the wound, or calf pain, call your surgeon's office.   Change dressing   Complete by: As directed    Maintain surgical dressing until follow up in the clinic. If the edges start to pull up, may reinforce with tape. If the dressing is no longer working, may remove and cover with gauze and tape, but must keep the area dry and clean.  Call with any questions or concerns.   Constipation Prevention   Complete by: As directed    Drink plenty of fluids.  Prune juice may be helpful.  You may use a stool softener, such as Colace (over the counter) 100 mg twice a day.  Use MiraLax (over the counter) for constipation as needed.   Diet - low sodium heart healthy   Complete by: As directed    Increase activity slowly as tolerated   Complete by: As directed    Weight bearing as tolerated with assist device (walker, cane,  etc) as directed, use it as long as suggested by your surgeon or therapist, typically at least 4-6 weeks.   Post-operative opioid taper instructions:   Complete by: As directed    POST-OPERATIVE OPIOID TAPER INSTRUCTIONS: It is important to wean off of your opioid medication as soon as possible. If you do not need pain medication after your surgery it is ok to stop day one. Opioids include: Codeine, Hydrocodone(Norco, Vicodin), Oxycodone(Percocet, oxycontin) and hydromorphone amongst others.  Long term and even short term use of opiods can cause: Increased pain response Dependence Constipation Depression Respiratory depression  And more.  Withdrawal symptoms can include Flu like symptoms Nausea, vomiting And more Techniques to manage these symptoms Hydrate well Eat regular healthy meals Stay active Use relaxation techniques(deep breathing, meditating, yoga) Do Not substitute Alcohol to help with tapering If you have been on opioids for less than two weeks and do not have pain than it is ok to stop all together.  Plan to wean off of opioids This plan should start within one week post op of your joint replacement. Maintain the same interval or time between taking each dose and first decrease the dose.  Cut the total daily intake of opioids by one tablet each day Next start to increase the time between doses. The last dose that should be eliminated is the evening dose.      TED hose   Complete by: As directed    Use stockings (TED hose) for 2 weeks on both leg(s).  You may remove them at night for sleeping.        Follow-up Information     Durene Romans, MD. Schedule an appointment as soon as possible for a visit in 2 week(s).   Specialty: Orthopedic Surgery Contact information: [address] [place] [address] [phone]                  Signed: Cassandria Anger 08/26/2023, 7:17 AM

## 2023-11-16 NOTE — Therapy (Addendum)
 " OUTPATIENT PHYSICAL THERAPY THORACOLUMBAR EVALUATION/DISCHARGE   Patient Name: Robert [name] MRN: [id number] DOB:[birth year], 68 y.o., male Today's Date: 11/18/2023  END OF SESSION:  PT End of Session - 11/17/23 1742     Visit Number 1    Number of Visits 12    Date for PT Re-Evaluation 01/15/24    Authorization Type [employer]    PT Start Time 1745    PT Stop Time 1830    PT Time Calculation (min) 45 min             Past Medical History:  Diagnosis Date   AAA (abdominal aortic aneurysm, ruptured) (HCC)    Cancer (HCC)    prostate   Fibromuscular dysplasia of renal artery (HCC)    s/p angioplasty to both renal arteries in 2006   History of abdominal aortic aneurysm    History of cardiac catheterization    LHC in 2013:  no sig CAD   Hyperlipidemia    Hypertension    Peripheral vascular disease (HCC)    Past Surgical History:  Procedure Laterality Date   ABDOMINAL AORTIC ANEURYSM REPAIR N/A 11/05/2018   Procedure: ANEURYSM ABDOMINAL AORTIC REPAIR WITH HEMASHIELD GOLD VELOUR VASCULAR GRAFT;  Surgeon: Harvey Carlin BRAVO, MD;  Location: [facility] OR;  Service: Vascular;  Laterality: N/A;   CARDIAC SURGERY  09/21/2004   stent placement   COLONOSCOPY WITH PROPOFOL  N/A 04/11/2021   Procedure: COLONOSCOPY WITH PROPOFOL ;  Surgeon: Rollin Dover, MD;  Location: [facility];  Service: Endoscopy;  Laterality: N/A;   CONVERSION TO TOTAL KNEE Right 08/12/2023   Procedure: CONVERSION TO TOTAL KNEE;  Surgeon: Ernie Cough, MD;  Location: [facility];  Service: Orthopedics;  Laterality: Right;   GOLD SEED IMPLANT N/A 01/21/2022   Procedure: GOLD SEED IMPLANT;  Surgeon: Lovie Arlyss CROME, MD;  Location: [facility];  Service: Urology;  Laterality: N/A;   HERNIA REPAIR     HIP ARTHROPLASTY Right    INSERTION OF MESH  08/31/2012   Procedure: INSERTION OF MESH;  Surgeon: Cough POUR. Belinda, MD;  Location: [facility];  Service: General;  Laterality: N/A;   KNEE SURGERY   2009/2010   right   KNEE SURGERY  09/21/2008   right knee partial replacement   LEFT HEART CATHETERIZATION WITH CORONARY ANGIOGRAM N/A 05/19/2012   Procedure: LEFT HEART CATHETERIZATION WITH CORONARY ANGIOGRAM;  Surgeon: Vinie KYM Maxcy, MD;  Location: [facility] CATH LAB;  Service: Cardiovascular;  Laterality: N/A;   POLYPECTOMY  04/11/2021   Procedure: POLYPECTOMY;  Surgeon: Rollin Dover, MD;  Location: [facility];  Service: Endoscopy;;   SPACE OAR INSTILLATION N/A 01/21/2022   Procedure: SPACE OAR INSTILLATION;  Surgeon: Lovie Arlyss CROME, MD;  Location: [facility];  Service: Urology;  Laterality: N/A;   UMBILICAL HERNIA REPAIR  08/31/2012   Procedure: HERNIA REPAIR UMBILICAL ADULT;  Surgeon: Cough POUR. Belinda, MD;  Location: [facility];  Service: General;  Laterality: N/A;  umbilical hernia repair with mesh   Patient Active Problem List   Diagnosis Date Noted   S/P total knee arthroplasty, right 08/12/2023   Malignant neoplasm of prostate (HCC) 11/25/2021   Pure hypercholesterolemia 11/20/2021   Hypertension 11/20/2021   Fatty liver 11/20/2021   ED (erectile dysfunction) of organic origin 11/20/2021   Chronic gingivitis 11/20/2021   Allergic rhinitis due to pollen 11/20/2021   DDD (degenerative disc disease), cervical 01/17/2019   Cervical spine pain 12/20/2018   Weakness of left arm 12/09/2018  Pain in joint of left shoulder 12/08/2018   Hematoma 11/15/2018   Acute renal failure (HCC)    Cardiomyopathy (HCC)    Cardiac arrest (HCC)    Ruptured abdominal aortic aneurysm (AAA) (HCC) 11/05/2018   AAA (abdominal aortic aneurysm, ruptured) (HCC) 11/05/2018   Osteoarthritis of right hip 10/03/2018   Pain in joint of right hip 09/16/2018   Osteoarthritis of knee 09/30/2017   Umbilical hernia 02/18/2012    PCP: Arloa Elsie SAUNDERS, MD   REFERRING PROVIDER: Burnetta Aures, MD  REFERRING DIAG: M54.51 (ICD-10-CM) - Vertebrogenic low back pain  Rationale  for Evaluation and Treatment: Rehabilitation  THERAPY DIAG:  Vertebrogenic low back pain  Muscle weakness (generalized)  Other abnormalities of gait and mobility  ONSET DATE: chronic  SUBJECTIVE:                                                                                                                                                                                           SUBJECTIVE STATEMENT: Arrives to OPPT with c/o R foot/lower leg numbness but denies distinct low back pain or symptoms.  Reports recent MRI shows pinched nerve but PT unable to access report.  Patient was told he needed aquatic PT but no further information provided to him.  He has just finished rehab for TKA and has some restrictions and limitations regarding mobility status.  Patient's complaints center around R foot/lower leg paresthesias however he is unable to relate distinct aggravating/relieving factors.  PERTINENT HISTORY:  1. 3 months s/p conversion to right total knee arthroplasty  2. Osteoarthritis, left knee  3. Continued right foot numbness - seen by Dr. Burnetta    Plan: [name] is now 3 months out from conversion to right TKA. He is doing well with the knee. He has full ROM, and is improving with activity tolerance. He does have several other issues unfortunately which are affecting his quality of life. His biggest complaint is continued right foot numbness. He had two injections with Dr. Bonner, and requested repeat injection but was suggested he try aquatic therapy first which I gave him the number to call and schedule now that he is finished with OPPT for the knee. I recommend he make a follow up with Dr. Burnetta in about a month for further discussion as this is affecting his ability to drive and function. In terms of his left knee, he has advanced osteoarthritis. He does wish to have a cortisone injection today, which was performed without difficulty. He can repeat this in May if needed. All  questions welcomed and addressed.   PAIN:  Are you having pain? No  PRECAUTIONS: None  RED FLAGS: None   WEIGHT BEARING RESTRICTIONS: No  FALLS:  Has patient fallen in last 6 months? No  OCCUPATION: Inspector  PLOF: Independent  PATIENT GOALS: To manage my leg symptoms  NEXT MD VISIT: 30 days   OBJECTIVE:  Note: Objective measures were completed at Evaluation unless otherwise noted.  DIAGNOSTIC FINDINGS:  None noted  PATIENT SURVEYS:  Modified Oswestry 10/50   MUSCLE LENGTH: Hamstrings: Right 70 deg; Left 70 deg   POSTURE: rounded shoulders and varus knees  PALPATION: deferred  LUMBAR ROM:   AROM eval  Flexion   Extension   Right lateral flexion   Left lateral flexion   Right rotation   Left rotation    (Blank rows = not tested)  LOWER EXTREMITY ROM:     Passive  Right eval Left eval  Hip flexion 100d 100d  Hip extension 10d 10d  Hip abduction    Hip adduction    Hip internal rotation    Hip external rotation    Knee flexion    Knee extension    Ankle dorsiflexion    Ankle plantarflexion    Ankle inversion    Ankle eversion     (B knees n/a)  LOWER EXTREMITY MMT:    MMT Right eval Left eval  Hip flexion    Hip extension    Hip abduction    Hip adduction    Hip internal rotation    Hip external rotation    Knee flexion    Knee extension    Ankle dorsiflexion    Ankle plantarflexion    Ankle inversion    Ankle eversion     (Blank rows = not tested)  LUMBAR SPECIAL TESTS:  Straight leg raise test: Negative and Slump test: Negative  FUNCTIONAL TESTS:  30 seconds chair stand test 5  GAIT: Distance walked: 63ft x2 Assistive device utilized: None Level of assistance: Complete Independence Comments: antalgic gait due to B TKA  TREATMENT DATE:  [facility] Adult PT Treatment:                                                DATE: 11/17/23 Eval                                                                                                                             PATIENT EDUCATION:  Education details: Discussed eval findings, rehab rationale and POC and patient is in agreement  Person educated: Patient Education method: Explanation Education comprehension: verbalized understanding and needs further education  HOME EXERCISE PROGRAM: TBD  ASSESSMENT:  CLINICAL IMPRESSION: Patient is a 68 y.o. male who was seen today for physical therapy evaluation and treatment for a dx of vertebrogenic low back pain in the absence of back pain.  Patient 's main concern is N&T in  his R foot brought on with prolonged walking and standing as well as dependent positioning. Scope of assessment was limited, answering patient questions and concerns regarding aquatic PT and MRI imaging which is not available to this PT.  No neuro tension signs elicited symptoms and numbness not able to be reproduced in clinic.  Further assessment of R lower leg needed to identify potential neural compression following recent TKA.  OBJECTIVE IMPAIRMENTS: Abnormal gait, decreased activity tolerance, decreased knowledge of condition, decreased mobility, difficulty walking, decreased strength, impaired perceived functional ability, and numbness of R foot    ACTIVITY LIMITATIONS: lifting, bending, sitting, standing, and prolonged walking  PERSONAL FACTORS: Fitness, Past/current experiences, Time since onset of injury/illness/exacerbation, and 1-2 comorbidities: B TKA are also affecting patient's functional outcome.   REHAB POTENTIAL: Fair based on diffuse symptoms  CLINICAL DECISION MAKING: Evolving/moderate complexity  EVALUATION COMPLEXITY: Moderate   GOALS: Goals reviewed with patient? No  SHORT TERM GOALS: Target date: 12/08/2023  Patient to demonstrate independence in HEP  Baseline: TBD Goal status: INITIAL  2.  Assess 2 MWT Baseline: TBD Goal status: INITIAL   LONG TERM GOALS: Target date: 12/29/2023    Patient will score at least 10/50 on  ODI to signify clinically meaningful improvement in functional abilities.   Baseline: 14/50 Goal status: INITIAL  2.  Patient will acknowledge 2/10 numbness intensity at least once during episode of care   Baseline: 4/10 Goal status: INITIAL  3.  Patient will increase 30s chair stand reps from 5 to 8 with/without arms to demonstrate and improved functional ability with less pain/difficulty as well as reduce fall risk.  Baseline: 5 Goal status: INITIAL  4.  Re-assess 2 MWT for changes in symptoms Baseline:  Goal status: INITIAL    PLAN:  PT FREQUENCY: 1-2x/week  PT DURATION: 6 weeks  PLANNED INTERVENTIONS: [id number]- PT Re-evaluation, 97110-Therapeutic exercises, 97530- Therapeutic activity, [id number]- Neuromuscular re-education, 97535- Self Care, [id number]- Manual therapy, [id number]- Gait training, [phone]- Aquatic Therapy, and Balance training.  PLAN FOR NEXT SESSION: HEP review and update, manual techniques as appropriate, aerobic tasks, ROM and flexibility activities, strengthening and PREs, TPDN, gait and balance training as needed     Reyes CHRISTELLA Kohut, PT 11/18/2023, 12:36 PM  "

## 2023-11-17 ENCOUNTER — Ambulatory Visit: Payer: 59 | Attending: Orthopedic Surgery

## 2023-11-17 DIAGNOSIS — R2689 Other abnormalities of gait and mobility: Secondary | ICD-10-CM | POA: Insufficient documentation

## 2023-11-17 DIAGNOSIS — M6281 Muscle weakness (generalized): Secondary | ICD-10-CM | POA: Insufficient documentation

## 2023-11-17 DIAGNOSIS — M5451 Vertebrogenic low back pain: Secondary | ICD-10-CM | POA: Insufficient documentation

## 2023-11-18 ENCOUNTER — Other Ambulatory Visit: Payer: Self-pay
